# Patient Record
Sex: Female | Born: 1955 | Race: White | Hispanic: No | Marital: Married | Smoking: Current every day smoker
Health system: Southern US, Community
[De-identification: ages and names within clinical notes are randomized; demographics above are authoritative.]

## PROBLEM LIST (undated history)

## (undated) DIAGNOSIS — M1990 Unspecified osteoarthritis, unspecified site: Secondary | ICD-10-CM

## (undated) DIAGNOSIS — F1910 Other psychoactive substance abuse, uncomplicated: Secondary | ICD-10-CM

## (undated) DIAGNOSIS — M797 Fibromyalgia: Secondary | ICD-10-CM

## (undated) DIAGNOSIS — E785 Hyperlipidemia, unspecified: Secondary | ICD-10-CM

## (undated) DIAGNOSIS — I10 Essential (primary) hypertension: Secondary | ICD-10-CM

## (undated) DIAGNOSIS — F329 Major depressive disorder, single episode, unspecified: Secondary | ICD-10-CM

## (undated) DIAGNOSIS — F419 Anxiety disorder, unspecified: Secondary | ICD-10-CM

## (undated) DIAGNOSIS — IMO0002 Reserved for concepts with insufficient information to code with codable children: Secondary | ICD-10-CM

## (undated) DIAGNOSIS — E119 Type 2 diabetes mellitus without complications: Secondary | ICD-10-CM

## (undated) DIAGNOSIS — F32A Depression, unspecified: Secondary | ICD-10-CM

## (undated) HISTORY — DX: Fibromyalgia: M79.7

## (undated) HISTORY — DX: Other psychoactive substance abuse, uncomplicated: F19.10

## (undated) HISTORY — DX: Unspecified osteoarthritis, unspecified site: M19.90

## (undated) HISTORY — DX: Anxiety disorder, unspecified: F41.9

## (undated) HISTORY — DX: Hyperlipidemia, unspecified: E78.5

## (undated) HISTORY — DX: Depression, unspecified: F32.A

## (undated) HISTORY — DX: Essential (primary) hypertension: I10

## (undated) HISTORY — DX: Reserved for concepts with insufficient information to code with codable children: IMO0002

## (undated) HISTORY — DX: Major depressive disorder, single episode, unspecified: F32.9

---

## 1999-08-25 HISTORY — PX: CERVICAL DISC SURGERY: SHX588

## 2000-02-26 ENCOUNTER — Ambulatory Visit (HOSPITAL_COMMUNITY): Admission: RE | Admit: 2000-02-26 | Discharge: 2000-02-26 | Payer: Self-pay | Admitting: Family Medicine

## 2000-02-26 ENCOUNTER — Encounter: Payer: Self-pay | Admitting: Family Medicine

## 2000-03-02 ENCOUNTER — Ambulatory Visit (HOSPITAL_COMMUNITY): Admission: RE | Admit: 2000-03-02 | Discharge: 2000-03-02 | Payer: Self-pay | Admitting: Family Medicine

## 2000-03-02 ENCOUNTER — Emergency Department (HOSPITAL_COMMUNITY): Admission: EM | Admit: 2000-03-02 | Discharge: 2000-03-03 | Payer: Self-pay | Admitting: Emergency Medicine

## 2000-03-02 ENCOUNTER — Encounter: Payer: Self-pay | Admitting: Family Medicine

## 2000-03-26 ENCOUNTER — Encounter: Payer: Self-pay | Admitting: Neurosurgery

## 2000-03-30 ENCOUNTER — Encounter: Payer: Self-pay | Admitting: Neurosurgery

## 2000-03-30 ENCOUNTER — Ambulatory Visit (HOSPITAL_COMMUNITY): Admission: RE | Admit: 2000-03-30 | Discharge: 2000-03-30 | Payer: Self-pay | Admitting: Neurosurgery

## 2000-05-14 ENCOUNTER — Encounter: Payer: Self-pay | Admitting: Neurosurgery

## 2000-05-14 ENCOUNTER — Ambulatory Visit (HOSPITAL_COMMUNITY): Admission: RE | Admit: 2000-05-14 | Discharge: 2000-05-14 | Payer: Self-pay | Admitting: Neurosurgery

## 2000-08-13 ENCOUNTER — Encounter: Payer: Self-pay | Admitting: Neurosurgery

## 2000-08-13 ENCOUNTER — Ambulatory Visit (HOSPITAL_COMMUNITY): Admission: RE | Admit: 2000-08-13 | Discharge: 2000-08-13 | Payer: Self-pay | Admitting: Neurosurgery

## 2001-03-18 ENCOUNTER — Encounter: Payer: Self-pay | Admitting: Family Medicine

## 2001-03-18 ENCOUNTER — Encounter: Admission: RE | Admit: 2001-03-18 | Discharge: 2001-03-18 | Payer: Self-pay | Admitting: Family Medicine

## 2001-12-03 ENCOUNTER — Encounter: Payer: Self-pay | Admitting: Emergency Medicine

## 2001-12-03 ENCOUNTER — Emergency Department (HOSPITAL_COMMUNITY): Admission: EM | Admit: 2001-12-03 | Discharge: 2001-12-04 | Payer: Self-pay | Admitting: *Deleted

## 2001-12-04 ENCOUNTER — Encounter: Payer: Self-pay | Admitting: Emergency Medicine

## 2002-04-13 ENCOUNTER — Emergency Department (HOSPITAL_COMMUNITY): Admission: EM | Admit: 2002-04-13 | Discharge: 2002-04-14 | Payer: Self-pay | Admitting: Emergency Medicine

## 2002-05-01 ENCOUNTER — Ambulatory Visit (HOSPITAL_COMMUNITY): Admission: RE | Admit: 2002-05-01 | Discharge: 2002-05-01 | Payer: Self-pay | Admitting: Family Medicine

## 2002-05-01 ENCOUNTER — Encounter: Payer: Self-pay | Admitting: Family Medicine

## 2002-10-19 ENCOUNTER — Encounter: Payer: Self-pay | Admitting: Sports Medicine

## 2002-10-19 ENCOUNTER — Ambulatory Visit (HOSPITAL_COMMUNITY): Admission: RE | Admit: 2002-10-19 | Discharge: 2002-10-19 | Payer: Self-pay | Admitting: Sports Medicine

## 2005-04-28 ENCOUNTER — Other Ambulatory Visit: Admission: RE | Admit: 2005-04-28 | Discharge: 2005-04-28 | Payer: Self-pay | Admitting: Family Medicine

## 2005-08-10 ENCOUNTER — Encounter: Admission: RE | Admit: 2005-08-10 | Discharge: 2005-08-10 | Payer: Self-pay | Admitting: Family Medicine

## 2005-09-18 ENCOUNTER — Encounter: Admission: RE | Admit: 2005-09-18 | Discharge: 2005-09-18 | Payer: Self-pay | Admitting: Family Medicine

## 2006-08-26 ENCOUNTER — Encounter: Admission: RE | Admit: 2006-08-26 | Discharge: 2006-08-26 | Payer: Self-pay | Admitting: Family Medicine

## 2007-11-30 ENCOUNTER — Encounter: Admission: RE | Admit: 2007-11-30 | Discharge: 2007-11-30 | Payer: Self-pay | Admitting: Internal Medicine

## 2007-12-17 ENCOUNTER — Emergency Department (HOSPITAL_COMMUNITY): Admission: EM | Admit: 2007-12-17 | Discharge: 2007-12-17 | Payer: Self-pay | Admitting: Emergency Medicine

## 2008-02-23 ENCOUNTER — Emergency Department (HOSPITAL_COMMUNITY): Admission: EM | Admit: 2008-02-23 | Discharge: 2008-02-23 | Payer: Self-pay | Admitting: Emergency Medicine

## 2008-04-15 ENCOUNTER — Emergency Department (HOSPITAL_COMMUNITY): Admission: EM | Admit: 2008-04-15 | Discharge: 2008-04-15 | Payer: Self-pay | Admitting: Emergency Medicine

## 2008-04-15 ENCOUNTER — Ambulatory Visit: Payer: Self-pay | Admitting: Psychiatry

## 2008-04-15 ENCOUNTER — Inpatient Hospital Stay (HOSPITAL_COMMUNITY): Admission: EM | Admit: 2008-04-15 | Discharge: 2008-04-19 | Payer: Self-pay | Admitting: Psychiatry

## 2008-12-15 ENCOUNTER — Emergency Department (HOSPITAL_COMMUNITY): Admission: EM | Admit: 2008-12-15 | Discharge: 2008-12-17 | Payer: Self-pay | Admitting: Emergency Medicine

## 2008-12-17 ENCOUNTER — Ambulatory Visit: Payer: Self-pay | Admitting: Psychiatry

## 2008-12-17 ENCOUNTER — Inpatient Hospital Stay (HOSPITAL_COMMUNITY): Admission: RE | Admit: 2008-12-17 | Discharge: 2008-12-19 | Payer: Self-pay | Admitting: Psychiatry

## 2009-10-23 ENCOUNTER — Emergency Department (HOSPITAL_COMMUNITY): Admission: EM | Admit: 2009-10-23 | Discharge: 2009-10-23 | Payer: Self-pay | Admitting: Emergency Medicine

## 2010-04-04 ENCOUNTER — Emergency Department (HOSPITAL_COMMUNITY): Admission: EM | Admit: 2010-04-04 | Discharge: 2010-04-04 | Payer: Self-pay | Admitting: Emergency Medicine

## 2010-04-05 ENCOUNTER — Ambulatory Visit: Payer: Self-pay | Admitting: Psychiatry

## 2010-04-05 ENCOUNTER — Inpatient Hospital Stay (HOSPITAL_COMMUNITY): Admission: RE | Admit: 2010-04-05 | Discharge: 2010-04-10 | Payer: Self-pay | Admitting: Psychiatry

## 2010-11-07 LAB — COMPREHENSIVE METABOLIC PANEL
ALT: 46 U/L — ABNORMAL HIGH (ref 0–35)
AST: 59 U/L — ABNORMAL HIGH (ref 0–37)
Albumin: 4.6 g/dL (ref 3.5–5.2)
Alkaline Phosphatase: 67 U/L (ref 39–117)
CO2: 25 mEq/L (ref 19–32)
Calcium: 9.4 mg/dL (ref 8.4–10.5)
GFR calc non Af Amer: >60 mL/min (ref >60)
Potassium: 3.6 mEq/L (ref 3.5–5.1)

## 2010-11-07 LAB — URINALYSIS, ROUTINE W REFLEX MICROSCOPIC
Ketones, ur: NEGATIVE mg/dL
Leukocytes, UA: NEGATIVE
Specific Gravity, Urine: 1.014 (ref 1.005–1.030)
Urobilinogen, UA: 0.2 mg/dL (ref 0.0–1.0)
pH: 7 (ref 5.0–8.0)
pH: 6.5 (ref 5.0–8.0)

## 2010-11-07 LAB — DIFFERENTIAL
Basophils Absolute: 0 10*3/uL (ref 0.0–0.1)
Basophils Relative: 0 % (ref 0–1)
Lymphocytes Relative: 23 % (ref 12–46)
Lymphs Abs: 1.8 10*3/uL (ref 0.7–4.0)
Monocytes Absolute: 0.7 10*3/uL (ref 0.1–1.0)
Monocytes Relative: 9 % (ref 3–12)

## 2010-11-07 LAB — CBC
HCT: 40.7 % (ref 36.0–46.0)
Hemoglobin: 14.2 g/dL (ref 12.0–15.0)
MCHC: 34.9 g/dL (ref 30.0–36.0)
RBC: 4.09 MIL/uL (ref 3.87–5.11)
WBC: 7.6 10*3/uL (ref 4.0–10.5)

## 2010-11-07 LAB — URINE MICROSCOPIC-ADD ON

## 2010-11-07 LAB — RAPID URINE DRUG SCREEN, HOSP PERFORMED
Cocaine: NOT DETECTED
Tetrahydrocannabinol: POSITIVE — AB

## 2010-12-03 LAB — COMPREHENSIVE METABOLIC PANEL
ALT: 94 U/L — ABNORMAL HIGH (ref 0–35)
ALT: 92 U/L — ABNORMAL HIGH (ref 0–35)
AST: 104 U/L — ABNORMAL HIGH (ref 0–37)
AST: 91 U/L — ABNORMAL HIGH (ref 0–37)
Albumin: 4.2 g/dL (ref 3.5–5.2)
Albumin: 4.1 g/dL (ref 3.5–5.2)
CO2: 28 mEq/L (ref 19–32)
Calcium: 10.3 mg/dL (ref 8.4–10.5)
Creatinine, Ser: 0.96 mg/dL (ref 0.4–1.2)
GFR calc Af Amer: >60 mL/min (ref >60)
GFR calc Af Amer: >60 mL/min (ref >60)
Glucose, Bld: 114 mg/dL — ABNORMAL HIGH (ref 70–99)
Potassium: 3.4 mEq/L — ABNORMAL LOW (ref 3.5–5.1)
Sodium: 136 mEq/L (ref 135–145)
Total Protein: 7.2 g/dL (ref 6.0–8.3)
Total Protein: 7.2 g/dL (ref 6.0–8.3)

## 2010-12-03 LAB — URINALYSIS, ROUTINE W REFLEX MICROSCOPIC
Bilirubin Urine: NEGATIVE
Glucose, UA: NEGATIVE mg/dL
Nitrite: NEGATIVE
Specific Gravity, Urine: 1.019 (ref 1.005–1.030)
Urobilinogen, UA: 1 mg/dL (ref 0.0–1.0)

## 2010-12-03 LAB — DIFFERENTIAL
Basophils Absolute: 0 10*3/uL (ref 0.0–0.1)
Basophils Relative: 1 % (ref 0–1)
Eosinophils Absolute: 0.4 10*3/uL (ref 0.0–0.7)
Neutrophils Relative %: 62 % (ref 43–77)

## 2010-12-03 LAB — URINE MICROSCOPIC-ADD ON

## 2010-12-03 LAB — TSH: TSH: 1.325 u[IU]/mL (ref 0.350–4.500)

## 2010-12-03 LAB — CBC
MCV: 105.6 fL — ABNORMAL HIGH (ref 78.0–100.0)
RBC: 3.37 MIL/uL — ABNORMAL LOW (ref 3.87–5.11)

## 2010-12-03 LAB — RAPID URINE DRUG SCREEN, HOSP PERFORMED
Amphetamines: NOT DETECTED
Barbiturates: NOT DETECTED
Cocaine: NOT DETECTED
Opiates: POSITIVE — AB
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
## 2011-01-09 NOTE — Op Note (Signed)
[place]  Patient:    Vanessa Hoffman, Vanessa Hoffman                   MRN: [id number] Proc. Date: 03/30/00 Adm. Date:  [id number] Disc. Date: [id number] Attending:  Donalee Citrin P                           Operative Report  PREOPERATIVE DIAGNOSIS:  C6 and C7 radiculopathy left.  POSTOPERATIVE DIAGNOSIS:  C6 and C7 radiculopathy left.  PROCEDURE PERFORMED:  Anterior cervical diskectomy infusion with fibula allograft and plating system C5-6 and C6-7.  SURGEON:  Donzetta Sprung. Roney Jaffe., MD  ASSISTANT:  Reinaldo Meeker, M.D.  ANESTHESIA:  General endotracheal.  BRIEF HISTORY:  The patient is a 55 year old who has had long-standing neck and left arm pain radiating into her thumb and little fingers all equally. She has had weakness in her triceps and biceps.  Evaluation revealed cervical spondylosis with a large disk discrepancy C5-6 left and C6-7 diffusely causing some cervical stenosis.  The patient presents for diskectomy.  DESCRIPTION OF PROCEDURE:  The patient was brought into the operating room and was induced under general anesthesia.  She was positioned supine on the bed with the right side of her neck prepped and draped in routine sterile fashion. A preoperative x-ray confirmed the C6-7 interspace and a curvilinear incision was made extending to the anterior border of the sternocleidomastoid into the midline.  An incision was made with a 20 blade scalpel.  Bovie electrocautery was used to get through the subcutaneous tissue.  The Metzenbaums were used to dissect out the superficial layer of the platysma.  The platysma was divided transversely.  The deep layer of the platysma was also dissected out with Metzenbaum scissors.  The avascular plane between the sternocleidomastoid and the omohyoid was developed down to the cervical spine.  The prevertebral fascia was divided using Kitners and the longus coli was identified and coagulated back using Bovie  electrocautery.  A spinal needle was inserted into the interspace.  Preoperative x-ray confirmed that to be the C5-6 interspace and then Bovie electrocautery used to take down the longus coli bilaterally. Then the Charnley retractor was placed as well as the up and down retractors. The 5-6 interspace was incised with an 11 blade scalpel as well as the C6-7 interspace, BA and FA curets were used to clear out the anterior annulus as well as pituitary rongeurs.  A 2-3 mm Kerrison punch were used to overbite the anterior osteophytes and then the Midas Rex was brought in with an A&A drill bit and used to drill down the interspace of C5 initially.  Then a 1-2 mm Kerrison punch was used to get underneath the osteophyte and a large disk fragment was noted to be subligamentous displacing the left C6 nerve root away.  This was teased out using 1 or 2 mm Kerrison punches and the dura was noted to be decompressed and tenting back up towards [place].  The remainder of the ligament was removed and the endplates were underbiten until both C6 nerve roots of C5-6 were decompressed.  The endplates were even off and a nerve hook was used to palpate both nerve roots and noted to be completely decompressed. Gelfoam was placed and attention was taken to the C6-7 interspace.  The Midas Rex was used to drill down the posterior osteophyte on the top of C7 and then 1 or 2 mm  Kerrison punches were used to get underneath the posterior longitudinal ligament.  This was divided.  The dura was visualized and then the ligament was then taken out and removed and reflected laterally down exposing the C7 nerve roots bilaterally.  The endplates were underbiten and the large posterior osteophyte at C7 was removed.  At the end of the decompression, both C7 nerve roots as well as thecal sac were noted to be completely decompressed.  The endplates were even off using the Midas Rex, drill bit and the BA curet.  Then attention was  turned to bone plug placement.  A 6 mm fibula allograft was placed at C5-6 after meticulous hemostasis had been maintained and noted to be under good compression.  A 7 mm fibula allograft was placed at C6-7.  Again all noted to be under good compression.  A 40 mm Atlantis plate was selected and this was aligned and then the right superior drill hole was drilled.  A 13 mm screw was placed.  The left inferior drill hole was drilled and 13 mm screws were placed there.  This was repeated at the superior left on 5, inferior right on 7 as well as two screws were placed in C6.  At the end, the wound was copiously irrigated.  Meticulously hemostasis was maintained and closed with 3-0 interrupted Vicryl on the platysma and a running 5-0 PDS on the skin.  Steri-Strips were applied.  The wound was dressed.  A collar was applied.  The patient went to the recovery room in stable condition.  At the end of the case all needle counts and sponge counts were correct. DD:  03/30/00 TD:  03/31/00 Job: [id number]
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
## 2011-03-31 ENCOUNTER — Other Ambulatory Visit: Payer: Self-pay | Admitting: Family Medicine

## 2011-03-31 DIAGNOSIS — Z1231 Encounter for screening mammogram for malignant neoplasm of breast: Secondary | ICD-10-CM

## 2011-04-15 ENCOUNTER — Ambulatory Visit: Payer: Self-pay

## 2011-04-23 ENCOUNTER — Telehealth: Payer: Self-pay | Admitting: *Deleted

## 2011-04-23 NOTE — Telephone Encounter (Signed)
Unable to reach by phone/sent no show letter.  me

## 2011-05-06 ENCOUNTER — Encounter: Payer: Self-pay | Admitting: Gastroenterology

## 2011-05-07 ENCOUNTER — Other Ambulatory Visit: Payer: Self-pay | Admitting: Gastroenterology

## 2011-05-14 ENCOUNTER — Ambulatory Visit
Admission: RE | Admit: 2011-05-14 | Discharge: 2011-05-14 | Disposition: A | Discharge status: Home / Self Care | Payer: Medicare Other | Source: Ambulatory Visit | Attending: Family Medicine | Admitting: Family Medicine

## 2011-05-14 DIAGNOSIS — Z1231 Encounter for screening mammogram for malignant neoplasm of breast: Secondary | ICD-10-CM

## 2011-05-15 ENCOUNTER — Encounter: Payer: Self-pay | Admitting: *Deleted

## 2011-05-21 LAB — LIPASE, BLOOD: Lipase: 79 — ABNORMAL HIGH

## 2011-05-21 LAB — COMPREHENSIVE METABOLIC PANEL
ALT: 71 — ABNORMAL HIGH
Alkaline Phosphatase: 80
Glucose, Bld: 94
Potassium: 4
Sodium: 135
Total Protein: 6.3

## 2011-05-22 ENCOUNTER — Ambulatory Visit: Payer: Self-pay | Admitting: Physical Medicine & Rehabilitation

## 2011-05-27 ENCOUNTER — Other Ambulatory Visit: Payer: Self-pay | Admitting: Gastroenterology

## 2011-06-18 ENCOUNTER — Telehealth: Payer: Self-pay | Admitting: *Deleted

## 2011-06-18 NOTE — Telephone Encounter (Signed)
Rescheduled for previsit

## 2011-06-19 ENCOUNTER — Ambulatory Visit (AMBULATORY_SURGERY_CENTER): Payer: Medicare Other | Admitting: *Deleted

## 2011-06-19 VITALS — Ht 63.0 in | Wt 155.0 lb

## 2011-06-19 DIAGNOSIS — Z1211 Encounter for screening for malignant neoplasm of colon: Secondary | ICD-10-CM

## 2011-06-19 MED ORDER — PEG-KCL-NACL-NASULF-NA ASC-C 100 G PO SOLR: ORAL | Status: DC

## 2011-06-29 ENCOUNTER — Encounter
Payer: PRIVATE HEALTH INSURANCE | Attending: Physical Medicine & Rehabilitation | Admitting: Physical Medicine & Rehabilitation

## 2011-06-29 ENCOUNTER — Ambulatory Visit: Payer: Self-pay | Admitting: Physical Medicine & Rehabilitation

## 2011-06-29 DIAGNOSIS — F1910 Other psychoactive substance abuse, uncomplicated: Secondary | ICD-10-CM | POA: Insufficient documentation

## 2011-06-29 DIAGNOSIS — F411 Generalized anxiety disorder: Secondary | ICD-10-CM | POA: Insufficient documentation

## 2011-06-29 DIAGNOSIS — IMO0001 Reserved for inherently not codable concepts without codable children: Secondary | ICD-10-CM

## 2011-06-29 DIAGNOSIS — M7710 Lateral epicondylitis, unspecified elbow: Secondary | ICD-10-CM | POA: Insufficient documentation

## 2011-06-29 DIAGNOSIS — M549 Dorsalgia, unspecified: Secondary | ICD-10-CM | POA: Insufficient documentation

## 2011-06-29 DIAGNOSIS — M961 Postlaminectomy syndrome, not elsewhere classified: Secondary | ICD-10-CM

## 2011-06-29 DIAGNOSIS — F329 Major depressive disorder, single episode, unspecified: Secondary | ICD-10-CM | POA: Insufficient documentation

## 2011-06-29 DIAGNOSIS — F3289 Other specified depressive episodes: Secondary | ICD-10-CM | POA: Insufficient documentation

## 2011-06-29 DIAGNOSIS — M25529 Pain in unspecified elbow: Secondary | ICD-10-CM | POA: Insufficient documentation

## 2011-06-29 DIAGNOSIS — F17200 Nicotine dependence, unspecified, uncomplicated: Secondary | ICD-10-CM | POA: Insufficient documentation

## 2011-06-29 DIAGNOSIS — Z79899 Other long term (current) drug therapy: Secondary | ICD-10-CM | POA: Insufficient documentation

## 2011-06-29 DIAGNOSIS — M546 Pain in thoracic spine: Secondary | ICD-10-CM | POA: Insufficient documentation

## 2011-06-29 DIAGNOSIS — G894 Chronic pain syndrome: Secondary | ICD-10-CM | POA: Insufficient documentation

## 2011-06-29 DIAGNOSIS — Z981 Arthrodesis status: Secondary | ICD-10-CM | POA: Insufficient documentation

## 2011-06-29 DIAGNOSIS — G5600 Carpal tunnel syndrome, unspecified upper limb: Secondary | ICD-10-CM
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
## 2011-07-02 ENCOUNTER — Other Ambulatory Visit: Payer: Self-pay | Admitting: Gastroenterology

## 2011-07-07 ENCOUNTER — Ambulatory Visit (AMBULATORY_SURGERY_CENTER): Payer: Medicare Other | Admitting: Gastroenterology

## 2011-07-07 ENCOUNTER — Encounter: Payer: Self-pay | Admitting: Gastroenterology

## 2011-07-07 VITALS — BP 118/77 | HR 92 | Temp 98.7°F | Resp 11 | Ht 63.0 in | Wt 155.0 lb

## 2011-07-07 DIAGNOSIS — Z1211 Encounter for screening for malignant neoplasm of colon: Secondary | ICD-10-CM

## 2011-07-07 MED ORDER — SODIUM CHLORIDE 0.9 % IV SOLN: 500.0000 mL | INTRAVENOUS | Status: DC

## 2011-07-07 NOTE — Progress Notes (Signed)
Patient did not experience any of the following events: a burn prior to discharge; a fall within the facility; wrong site/side/patient/procedure/implant event; or a hospital transfer or hospital admission upon discharge from the facility. (G8907) Patient did not have preoperative order for IV antibiotic SSI prophylaxis. (G8918)  

## 2011-07-07 NOTE — Patient Instructions (Signed)
Please follow all discharge instructions given to you by the recovery room nurse. If you have any questions or problems after discharge please call [phone]. You will receive a phone call in the am to see how you are doing and answer any questions you may have. Thank you for choosing [facility] for your health care needs.  

## 2011-07-07 NOTE — Progress Notes (Signed)
Propofol was administered to the pt by Brennan Bailey, CRNA for sedation. Maw  14:28 second bag of 0.9%N.Haywood Pao. Maw  Pt tolerated the colonoscopy very well. maw

## 2011-07-08 ENCOUNTER — Telehealth: Payer: Self-pay | Admitting: *Deleted

## 2011-07-08 NOTE — Telephone Encounter (Signed)

## 2011-07-27 ENCOUNTER — Emergency Department (HOSPITAL_COMMUNITY)
Admission: EM | Admit: 2011-07-27 | Discharge: 2011-07-28 | Disposition: A | Discharge status: Other Acute Inpatient Hospital | Payer: PRIVATE HEALTH INSURANCE | Source: Home / Self Care | Attending: Emergency Medicine | Admitting: Emergency Medicine

## 2011-07-27 ENCOUNTER — Encounter (HOSPITAL_COMMUNITY): Payer: Self-pay

## 2011-07-27 DIAGNOSIS — F329 Major depressive disorder, single episode, unspecified: Secondary | ICD-10-CM

## 2011-07-27 DIAGNOSIS — I10 Essential (primary) hypertension: Secondary | ICD-10-CM | POA: Insufficient documentation

## 2011-07-27 DIAGNOSIS — F17200 Nicotine dependence, unspecified, uncomplicated: Secondary | ICD-10-CM | POA: Insufficient documentation

## 2011-07-27 DIAGNOSIS — IMO0001 Reserved for inherently not codable concepts without codable children: Secondary | ICD-10-CM | POA: Insufficient documentation

## 2011-07-27 DIAGNOSIS — F3289 Other specified depressive episodes: Secondary | ICD-10-CM | POA: Insufficient documentation

## 2011-07-27 LAB — CBC
HCT: 30.7 % — ABNORMAL LOW (ref 36.0–46.0)
MCHC: 33.9 g/dL (ref 30.0–36.0)
MCV: 94.2 fL (ref 78.0–100.0)
RDW: 13.4 % (ref 11.5–15.5)

## 2011-07-27 LAB — BASIC METABOLIC PANEL
BUN: 22 mg/dL (ref 6–23)
Calcium: 10.2 mg/dL (ref 8.4–10.5)
Creatinine, Ser: 1.41 mg/dL — ABNORMAL HIGH (ref 0.50–1.10)
GFR calc Af Amer: 48 mL/min — ABNORMAL LOW (ref >90)
GFR calc non Af Amer: 42 mL/min — ABNORMAL LOW (ref >90)
Potassium: 4.2 mEq/L (ref 3.5–5.1)

## 2011-07-27 LAB — RAPID URINE DRUG SCREEN, HOSP PERFORMED: Barbiturates: NOT DETECTED

## 2011-07-27 MED ORDER — ZOLPIDEM TARTRATE 5 MG PO TABS
5.0000 mg | ORAL_TABLET | Freq: Every evening | ORAL | Status: DC | PRN
  Administered 2011-07-27: 5 mg via ORAL
  Filled 2011-07-27: qty 1

## 2011-07-27 MED ORDER — ACETAMINOPHEN 325 MG PO TABS
650.0000 mg | ORAL_TABLET | ORAL | Status: DC | PRN
Start: 2011-07-27 — End: 2011-07-28
  Administered 2011-07-27: 650 mg via ORAL
  Filled 2011-07-27: qty 2

## 2011-07-27 MED ORDER — ALUM & MAG HYDROXIDE-SIMETH 200-200-20 MG/5ML PO SUSP: 30.0000 mL | ORAL | Status: DC | PRN

## 2011-07-27 MED ORDER — LORAZEPAM 1 MG PO TABS
1.0000 mg | ORAL_TABLET | Freq: Three times a day (TID) | ORAL | Status: DC | PRN
  Administered 2011-07-27 – 2011-07-28 (×2): 1 mg via ORAL
  Filled 2011-07-27 (×2): qty 1

## 2011-07-27 MED ORDER — NICOTINE 21 MG/24HR TD PT24
21.0000 mg | MEDICATED_PATCH | Freq: Every day | TRANSDERMAL | Status: DC
  Administered 2011-07-27 – 2011-07-28 (×2): 21 mg via TRANSDERMAL
  Filled 2011-07-27 (×2): qty 1

## 2011-07-27 MED ORDER — IBUPROFEN 200 MG PO TABS: 600.0000 mg | ORAL_TABLET | Freq: Three times a day (TID) | ORAL | Status: DC | PRN

## 2011-07-27 MED ORDER — ONDANSETRON HCL 4 MG PO TABS: 4.0000 mg | ORAL_TABLET | Freq: Three times a day (TID) | ORAL | Status: DC | PRN

## 2011-07-27 MED ORDER — SODIUM CHLORIDE 0.9 % IV BOLUS (SEPSIS)
1000.0000 mL | Freq: Once | INTRAVENOUS | Status: AC
  Administered 2011-07-27: 1000 mL via INTRAVENOUS

## 2011-07-27 NOTE — ED Notes (Signed)
One patient belonging bag locked in activity room. 

## 2011-07-27 NOTE — ED Notes (Signed)
Patient here with flight of ideas. Reports arthritis symptoms then switches to discussion of depression then back to right elbow pain, patient with flat affect. Reports taking wellbutrin x 3 days

## 2011-07-27 NOTE — ED Notes (Signed)
Pt speaking with Social Worker at this time 

## 2011-07-27 NOTE — ED Notes (Signed)
Pt to be transferred to main ED room 20 for fluid bolus and then to return after bolus finishes. Report called to [place], Charity fundraiser.

## 2011-07-27 NOTE — ED Notes (Signed)
Pt sent to the restroom to change into scrubs. Triage RN aware

## 2011-07-27 NOTE — ED Notes (Signed)
Went to take patient vital signs. Patient was not in her room. Found her in another empty room sitting on bed watching tv. Asked patient why she was in this room and she stated "Because this is my room." Showed patient back to her correct room.

## 2011-07-27 NOTE — BH Assessment (Addendum)
Assessment Note   Vanessa Hoffman is an 55 y.o. female. Pt was accompanied to the [facility] by her father. Pt states that she is at the [facility] because her "ankles have been doing crazy things and making her walk funny". Pt explained that she "doesn't feel right", stating that she feels she has been talking slower along with trouble getting her thoughts together and staying focused. Pt presents with a flight of ideas throughout assessment. Pt reported to the RN that she had been having auditory hallucinations for 2 months, though told this writer that there have been no auditory hallucinations, only visual hallucinations for 3 days. Pt reports seeing things "flash before her" and "sensing that someone is beside her". Pt also added that she has been seeing dogs, though there are none in her home. Pt had a negative UDS and BAL was <11. Pt has no current therapist or psychiatrist. Pt information is being sent to [facility] for review.   07/27/11  22:14: Pt is now stating that she has not had any alcohol in 3 weeks, though earlier in assessment she stated that it had only been 1 week without alcohol. Pt also states that she has had hallucinations and trouble with her "ankles" for 3 weeks, however the issues with slow speech began more than 3 weeks ago. 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 EDP has requested a UA and plans to give the pt fluids. Pt is not on a CIWA protocol.    Axis I: Psychotic Disorder NOS Axis II: Deferred Axis III:  Past Medical History  Diagnosis Date  . Anxiety   . Arthritis   . Depression   . Hypertension   . Fibromyalgia   . DDD (degenerative disc disease)   . Osteoporosis    Axis IV: problems with primary support group Axis V: 21-30 behavior considerably influenced by delusions or hallucinations OR serious impairment in judgment, communication OR inability to function in almost all areas  Past Medical History:  Past Medical History    Diagnosis Date  . Anxiety   . Arthritis   . Depression   . Hypertension   . Fibromyalgia   . DDD (degenerative disc disease)   . Osteoporosis     Past Surgical History  Procedure Date  . Cervical disc surgery 2001    3 fusions  . Cesarean section     2    Family History: No family history on file.  Social History:  reports that she has been smoking Cigarettes.  She has been smoking about .3 packs per day. She has never used smokeless tobacco. She reports that she drinks about 4.2 ounces of alcohol per week. She reports that she does not use illicit drugs.  Allergies: No Known Allergies  Home Medications:  Medications Prior to Admission  Medication Dose Route Frequency Provider Last Rate Last Dose  . acetaminophen (TYLENOL) tablet 650 mg  650 mg Oral Q4H PRN Thomasene Lot, PA   650 mg at 07/27/11 1701  . alum & mag hydroxide-simeth (MAALOX/MYLANTA) 200-200-20 MG/5ML suspension 30 mL  30 mL Oral PRN Thomasene Lot, PA      . ibuprofen (ADVIL,MOTRIN) tablet 600 mg  600 mg Oral Q8H PRN Thomasene Lot, PA      . LORazepam (ATIVAN) tablet 1 mg  1 mg Oral Q8H PRN Thomasene Lot, PA      . nicotine (NICODERM CQ - dosed in mg/24 hours) patch 21 mg  21 mg Transdermal Daily Thomasene Lot, [place]   21 mg at 07/27/11 1703  . ondansetron (ZOFRAN) tablet 4 mg  4 mg Oral Q8H PRN Thomasene Lot, PA      . zolpidem (AMBIEN) tablet 5 mg  5 mg Oral QHS PRN Thomasene Lot, PA       Medications Prior to Admission  Medication Sig Dispense Refill  . amitriptyline (ELAVIL) 25 MG tablet Take 25 mg by mouth 2 (two) times daily.        . clonazePAM (KLONOPIN) 1 MG tablet Take 1 mg by mouth 3 (three) times daily.        . pravastatin (PRAVACHOL) 20 MG tablet Take 20 mg by mouth at bedtime.        . traZODone (DESYREL) 100 MG tablet Take 100 mg by mouth at bedtime.        . valsartan-hydrochlorothiazide (DIOVAN-HCT) 160-12.5 MG per tablet Take 1 tablet by mouth daily.          OB/GYN Status:  No  LMP recorded. Patient is postmenopausal.  General Assessment Data Assessment Number: 1  Living Arrangements: Parent Can pt return to current living arrangement?: Yes Admission Status: Voluntary Is patient capable of signing voluntary admission?: Yes Transfer from: Acute Hospital Referral Source: Self/Family/Friend  Risk to self Suicidal Ideation: No Suicidal Intent: No Is patient at risk for suicide?: No Suicidal Plan?: No Access to Means: No What has been your use of drugs/alcohol within the last 12 months?: etoh: 6 beers daily for 15yrs Other Self Harm Risks: Pt reported previous gesture 1 x  Triggers for Past Attempts: Other (Comment) (depression: trouble with marriage: husband unfaithful) Intentional Self Injurious Behavior: None Factors that decrease suicide risk: Sense of responsibility to family Family Suicide History: Yes Recent stressful life event(s): Other (Comment) (moving in with her father in 2000) Persecutory voices/beliefs?: No Depression: Yes Depression Symptoms: Insomnia;Isolating;Fatigue;Loss of interest in usual pleasures;Feeling worthless/self pity Substance abuse history and/or treatment for substance abuse?: Yes (multiple inpt. stays for detox) Suicide prevention information given to non-admitted patients: Not applicable  Risk to Others Homicidal Ideation: No Thoughts of Harm to Others: No Current Homicidal Intent: No Current Homicidal Plan: No Access to Homicidal Means: No Identified Victim: none History of harm to others?: No Assessment of Violence: None Noted Violent Behavior Description: n/a Does patient have access to weapons?: No Criminal Charges Pending?: No Does patient have a court date: No  Mental Status Report Appear/Hygiene: Disheveled Eye Contact: Fair Motor Activity: Unable to assess Speech: Other (Comment);Slow (pt displaying flight of ideas) Level of Consciousness: Alert Mood: Ambivalent Affect: Blunted (flat) Anxiety Level:  None Thought Processes: Irrelevant;Flight of Ideas Judgement: Impaired Orientation: Person;Place Obsessive Compulsive Thoughts/Behaviors: None  Cognitive Functioning Concentration: Decreased Memory: Recent Impaired;Remote Impaired IQ: Average Insight: Fair Impulse Control: Fair Appetite: Good Sleep: No Change Total Hours of Sleep: 4  Vegetative Symptoms: None  Prior Inpatient/Outpatient Therapy Prior Therapy: Inpatient Prior Therapy Dates: 2001, 2003, 2010, 2012 Prior Therapy Facilty/Provider(s): [facility], [place], [facility], [address] Reason for Treatment: detox, 2001 suicide attempt                     Additional Information 1:1 In Past 12 Months?: No CIRT Risk: No Elopement Risk: No Does patient have medical clearance?: Yes     Disposition:  Disposition Disposition of Patient: Inpatient treatment program;Referred to [facility]) Type of inpatient treatment program: Adult Patient referred to: Other (Comment) [facility])  Patient was admitted to [facility] from NP [place] to  Dr. Allena Katz to a 400 hall bed. [facility] need results of UA prior to patient being transported to [facility].  On Site Evaluation by:   Reviewed with Physician:     N[place] Crane F 07/27/2011 7:05 PM

## 2011-07-27 NOTE — ED Notes (Signed)
Patient requested for her parents to come back to visit. Called triage and they will call for parents.

## 2011-07-27 NOTE — ED Notes (Signed)
Introduced self to patient. Asked if she would like to have something to drink. Provided patient with soda and chips. Patients also needed a remote for television. Provided for patient.

## 2011-07-27 NOTE — ED Notes (Signed)
Pt states she has been having auditory hallucinations x 2 months.  Unable to understand what they are saying.  Also states that "her feet do weird things when they move or when she tries to walk" (Pt ambulates without difficulty).  Denies SI/HI.

## 2011-07-27 NOTE — ED Provider Notes (Signed)
History     CSN: [id number] Arrival date & time: 07/27/2011  3:00 PM   First MD Initiated Contact with Patient 07/27/11 1501    3:38 PM HPI  Reports falling frequently, speech change, and myalgias. Also states she has  depression since a child. And is here for treatment. Denies SI/HI, Hallucinations.   PCP Dr. Riley Kill  Patient is a 55 y.o. female presenting with mental health disorder. The history is provided by the patient.  Mental Health Problem The primary symptoms do not include hallucinations. This is a chronic problem.  The degree of incapacity that she is experiencing as a consequence of her illness is mild. Additional symptoms of the illness include flight of ideas. She does not admit to suicidal ideas. She does not have a plan to commit suicide. She does not contemplate harming herself. She has not already injured self. She does not contemplate injuring another person. She has not already  injured another person.    Past Medical History  Diagnosis Date  . Anxiety   . Arthritis   . Depression   . Hypertension   . Fibromyalgia   . DDD (degenerative disc disease)   . Osteoporosis     Past Surgical History  Procedure Date  . Cervical disc surgery 2001    3 fusions  . Cesarean section     2    No family history on file.  History  Substance Use Topics  . Smoking status: Current Everyday Smoker -- 0.3 packs/day    Types: Cigarettes  . Smokeless tobacco: Never Used  . Alcohol Use: 4.2 oz/week    7 Glasses of wine per week    OB History    Grav Para Term Preterm Abortions TAB SAB Ect Mult Living                  Review of Systems  Musculoskeletal: Positive for myalgias, arthralgias and gait problem.  Psychiatric/Behavioral: Positive for behavioral problems. Negative for suicidal ideas, hallucinations and self-injury.  All other systems reviewed and are negative.    Allergies  Review of patient's allergies indicates no known allergies.  Home Medications     Current Outpatient Rx  Name Route Sig Dispense Refill  . AMITRIPTYLINE HCL 25 MG PO TABS Oral Take 25 mg by mouth 2 (two) times daily.      . BUPROPION HCL ER (XL) 150 MG PO TB24 Oral Take 150 mg by mouth daily.      Marland Kitchen CLONAZEPAM 1 MG PO TABS Oral Take 1 mg by mouth 3 (three) times daily.      Marland Kitchen HYDROCODONE-ACETAMINOPHEN 5-325 MG PO TABS Oral Take 1 tablet by mouth every 12 (twelve) hours as needed. pain     . PRAVASTATIN SODIUM 20 MG PO TABS Oral Take 20 mg by mouth at bedtime.      Marland Kitchen PROPRANOLOL HCL 10 MG PO TABS Oral Take 10 mg by mouth daily.      Marland Kitchen PROPRANOLOL HCL 20 MG PO TABS Oral Take 20 mg by mouth 2 (two) times daily.      . TRAZODONE HCL 100 MG PO TABS Oral Take 100 mg by mouth at bedtime.      Marland Kitchen VALSARTAN-HYDROCHLOROTHIAZIDE 160-12.5 MG PO TABS Oral Take 1 tablet by mouth daily.        BP 100/63  Pulse 105  Temp(Src) 97.7 F (36.5 C) (Oral)  Resp 20  SpO2 100%  Physical Exam  Vitals reviewed. Constitutional: She is oriented to person,  place, and time. Vital signs are normal. She appears well-developed and well-nourished.  HENT:  Head: Normocephalic and atraumatic.  Eyes: Conjunctivae are normal. Pupils are equal, round, and reactive to light.  Neck: Normal range of motion. Neck supple.  Cardiovascular: Normal rate, regular rhythm and normal heart sounds.  Exam reveals no friction rub.   No murmur heard. Pulmonary/Chest: Effort normal and breath sounds normal. She has no wheezes. She has no rhonchi. She has no rales. She exhibits no tenderness.  Musculoskeletal: Normal range of motion.  Neurological: She is alert and oriented to person, place, and time. Coordination normal.  Skin: Skin is warm and dry. No rash noted. No erythema. No pallor.  Psychiatric:       Patient has a flat affect and a poor historian. States she has several vague problems but avoids telling me how long she has had the problems. Then states she is also depressed. In appropriate reactions: Laughs  when I ask about SI or HI, but denies it.     ED Course  Procedures   MDM   10:46 PM Patient denies significant history of alcohol abuse. BUN slightly elevated compared to prior labs. We'll give him a liter of IV fluids and recheck. Has spoken with the patient will come see the patient for evaluation.      Thomasene Lot, [place] 07/28/11 [id number]

## 2011-07-27 NOTE — ED Notes (Signed)
Escorted pt to room 20; assisted into bed. Marissa, RN at bedside at this time.

## 2011-07-28 ENCOUNTER — Encounter (HOSPITAL_COMMUNITY): Payer: Self-pay | Admitting: *Deleted

## 2011-07-28 ENCOUNTER — Inpatient Hospital Stay (HOSPITAL_COMMUNITY)
Admission: RE | Admit: 2011-07-28 | Discharge: 2011-08-21 | DRG: 885 | Disposition: A | Discharge status: Home / Self Care | Payer: PRIVATE HEALTH INSURANCE | Source: Ambulatory Visit | Attending: Psychiatry | Admitting: Psychiatry

## 2011-07-28 DIAGNOSIS — I10 Essential (primary) hypertension: Secondary | ICD-10-CM

## 2011-07-28 DIAGNOSIS — Z79899 Other long term (current) drug therapy: Secondary | ICD-10-CM

## 2011-07-28 DIAGNOSIS — Z981 Arthrodesis status: Secondary | ICD-10-CM

## 2011-07-28 DIAGNOSIS — IMO0001 Reserved for inherently not codable concepts without codable children: Secondary | ICD-10-CM

## 2011-07-28 DIAGNOSIS — Z818 Family history of other mental and behavioral disorders: Secondary | ICD-10-CM

## 2011-07-28 DIAGNOSIS — Z7982 Long term (current) use of aspirin: Secondary | ICD-10-CM

## 2011-07-28 DIAGNOSIS — F411 Generalized anxiety disorder: Secondary | ICD-10-CM

## 2011-07-28 DIAGNOSIS — M810 Age-related osteoporosis without current pathological fracture: Secondary | ICD-10-CM

## 2011-07-28 DIAGNOSIS — I672 Cerebral atherosclerosis: Secondary | ICD-10-CM

## 2011-07-28 DIAGNOSIS — R45851 Suicidal ideations: Secondary | ICD-10-CM

## 2011-07-28 DIAGNOSIS — IMO0002 Reserved for concepts with insufficient information to code with codable children: Secondary | ICD-10-CM

## 2011-07-28 DIAGNOSIS — M129 Arthropathy, unspecified: Secondary | ICD-10-CM

## 2011-07-28 DIAGNOSIS — F333 Major depressive disorder, recurrent, severe with psychotic symptoms: Secondary | ICD-10-CM | POA: Diagnosis present

## 2011-07-28 DIAGNOSIS — F17200 Nicotine dependence, unspecified, uncomplicated: Secondary | ICD-10-CM

## 2011-07-28 DIAGNOSIS — F0150 Vascular dementia without behavioral disturbance: Secondary | ICD-10-CM

## 2011-07-28 DIAGNOSIS — F1910 Other psychoactive substance abuse, uncomplicated: Secondary | ICD-10-CM

## 2011-07-28 LAB — URINALYSIS, ROUTINE W REFLEX MICROSCOPIC
Bilirubin Urine: NEGATIVE
Glucose, UA: NEGATIVE mg/dL
Hgb urine dipstick: NEGATIVE
Ketones, ur: NEGATIVE mg/dL
pH: 6.5 (ref 5.0–8.0)

## 2011-07-28 LAB — BASIC METABOLIC PANEL
BUN: 25 mg/dL — ABNORMAL HIGH (ref 6–23)
CO2: 30 mEq/L (ref 19–32)
Chloride: 101 mEq/L (ref 96–112)
Creatinine, Ser: 1.34 mg/dL — ABNORMAL HIGH (ref 0.50–1.10)

## 2011-07-28 MED ORDER — PNEUMOCOCCAL VAC POLYVALENT 25 MCG/0.5ML IJ INJ: 0.5000 mL | INJECTION | INTRAMUSCULAR | Status: AC

## 2011-07-28 MED ORDER — AMITRIPTYLINE HCL 25 MG PO TABS: 25.0000 mg | ORAL_TABLET | Freq: Two times a day (BID) | ORAL | Status: DC

## 2011-07-28 MED ORDER — CLONAZEPAM 1 MG PO TABS: 1.0000 mg | ORAL_TABLET | Freq: Three times a day (TID) | ORAL | Status: DC

## 2011-07-28 MED ORDER — ARIPIPRAZOLE 2 MG PO TABS
2.0000 mg | ORAL_TABLET | Freq: Every day | ORAL | Status: DC
  Filled 2011-07-28: qty 1

## 2011-07-28 MED ORDER — AMITRIPTYLINE HCL 25 MG PO TABS
25.0000 mg | ORAL_TABLET | ORAL | Status: DC
  Administered 2011-07-28 – 2011-08-03 (×12): 25 mg via ORAL
  Filled 2011-07-28 (×14): qty 1

## 2011-07-28 MED ORDER — PROPRANOLOL HCL 20 MG PO TABS: 20.0000 mg | ORAL_TABLET | Freq: Two times a day (BID) | ORAL | Status: DC

## 2011-07-28 MED ORDER — SIMVASTATIN 10 MG PO TABS: 10.0000 mg | ORAL_TABLET | Freq: Every day | ORAL | Status: DC

## 2011-07-28 MED ORDER — HYDROCHLOROTHIAZIDE 12.5 MG PO CAPS
12.5000 mg | ORAL_CAPSULE | Freq: Every day | ORAL | Status: DC
  Administered 2011-07-28 – 2011-08-21 (×24): 12.5 mg via ORAL
  Filled 2011-07-28: qty 10
  Filled 2011-07-28 (×7): qty 1
  Filled 2011-07-28: qty 10
  Filled 2011-07-28: qty 1
  Filled 2011-07-28: qty 10
  Filled 2011-07-28 (×8): qty 1
  Filled 2011-07-28: qty 10
  Filled 2011-07-28 (×8): qty 1

## 2011-07-28 MED ORDER — NITROFURANTOIN MACROCRYSTAL 50 MG PO CAPS
50.0000 mg | ORAL_CAPSULE | Freq: Four times a day (QID) | ORAL | Status: DC
  Administered 2011-07-28 – 2011-08-07 (×36): 50 mg via ORAL
  Filled 2011-07-28 (×47): qty 1

## 2011-07-28 MED ORDER — SERTRALINE HCL 100 MG PO TABS
100.0000 mg | ORAL_TABLET | Freq: Every day | ORAL | Status: DC
  Administered 2011-07-28 – 2011-07-29 (×2): 100 mg via ORAL
  Filled 2011-07-28 (×2): qty 1

## 2011-07-28 MED ORDER — OLMESARTAN MEDOXOMIL 20 MG PO TABS
20.0000 mg | ORAL_TABLET | Freq: Every day | ORAL | Status: DC
  Administered 2011-07-28 – 2011-08-21 (×24): 20 mg via ORAL
  Filled 2011-07-28: qty 10
  Filled 2011-07-28 (×24): qty 1

## 2011-07-28 MED ORDER — SODIUM CHLORIDE 0.9 % IV BOLUS (SEPSIS)
1000.0000 mL | Freq: Once | INTRAVENOUS | Status: AC
  Administered 2011-07-28: 1000 mL via INTRAVENOUS

## 2011-07-28 MED ORDER — DIVALPROEX SODIUM ER 500 MG PO TB24
1000.0000 mg | ORAL_TABLET | Freq: Every day | ORAL | Status: DC
  Administered 2011-07-28 – 2011-08-10 (×14): 1000 mg via ORAL
  Filled 2011-07-28 (×11): qty 2
  Filled 2011-07-28: qty 1
  Filled 2011-07-28 (×4): qty 2

## 2011-07-28 MED ORDER — INFLUENZA VIRUS VACC SPLIT PF IM SUSP: 0.5000 mL | INTRAMUSCULAR | Status: AC

## 2011-07-28 MED ORDER — VALSARTAN-HYDROCHLOROTHIAZIDE 160-12.5 MG PO TABS: 1.0000 | ORAL_TABLET | Freq: Every day | ORAL | Status: DC

## 2011-07-28 MED ORDER — SIMVASTATIN 10 MG PO TABS
10.0000 mg | ORAL_TABLET | Freq: Every day | ORAL | Status: DC
  Administered 2011-07-28 – 2011-08-20 (×24): 10 mg via ORAL
  Filled 2011-07-28 (×26): qty 1

## 2011-07-28 MED ORDER — PROPRANOLOL HCL 20 MG PO TABS
20.0000 mg | ORAL_TABLET | ORAL | Status: DC
  Administered 2011-07-28 – 2011-08-21 (×47): 20 mg via ORAL
  Filled 2011-07-28 (×14): qty 1
  Filled 2011-07-28: qty 20
  Filled 2011-07-28: qty 1
  Filled 2011-07-28: qty 20
  Filled 2011-07-28 (×33): qty 1

## 2011-07-28 MED ORDER — BUPROPION HCL ER (XL) 150 MG PO TB24
150.0000 mg | ORAL_TABLET | Freq: Every day | ORAL | Status: DC
  Administered 2011-07-28 – 2011-08-03 (×7): 150 mg via ORAL
  Filled 2011-07-28 (×7): qty 1

## 2011-07-28 MED ORDER — ARIPIPRAZOLE 5 MG PO TABS
5.0000 mg | ORAL_TABLET | Freq: Every day | ORAL | Status: DC
  Administered 2011-07-28 – 2011-07-29 (×2): 5 mg via ORAL
  Filled 2011-07-28 (×4): qty 1

## 2011-07-28 MED ORDER — ARIPIPRAZOLE 2 MG PO TABS: 2.0000 mg | ORAL_TABLET | Freq: Every day | ORAL | Status: DC

## 2011-07-28 MED ORDER — TRAZODONE HCL 100 MG PO TABS
100.0000 mg | ORAL_TABLET | Freq: Every day | ORAL | Status: DC
  Administered 2011-07-28 – 2011-08-17 (×21): 100 mg via ORAL
  Filled 2011-07-28 (×21): qty 1

## 2011-07-28 MED ORDER — CLONAZEPAM 1 MG PO TABS
1.0000 mg | ORAL_TABLET | ORAL | Status: DC
  Administered 2011-07-28 – 2011-07-31 (×8): 1 mg via ORAL
  Filled 2011-07-28 (×8): qty 1

## 2011-07-28 MED ORDER — CYCLOBENZAPRINE HCL 10 MG PO TABS: 5.0000 mg | ORAL_TABLET | Freq: Three times a day (TID) | ORAL | Status: DC | PRN

## 2011-07-28 NOTE — ED Notes (Signed)
Mother switched places with family member. Father at bedside now.

## 2011-07-28 NOTE — Progress Notes (Signed)
Patient admitted to unit this afternoon.  She was pleasant and cooperative with the admission process, but confused at times.  Unable to answer all admission questions and gave bizarre answers to some other questions.  She was oriented to the unit and offered fluids.  She states she has been very unsteady for several days and has trouble finding words and has fallen a few times.  Difficult to fully assess her level of understanding.  Lives with parents and thinks she will be able to go back to there at time of discharge.

## 2011-07-28 NOTE — ED Notes (Signed)
Pt has been accepted to [facility]. Report given. Pt is being transfer.

## 2011-07-28 NOTE — Progress Notes (Signed)
Per State Regulation 482.30  This chart was reviewed for medical necessity with respect to the patient's Admission/Duration of stay.   Next review due:  07/31/11   Ambrose Mantle, LCSW  07/28/2011  2:49 PM

## 2011-07-28 NOTE — ED Notes (Signed)
Patient to get this second 1L of NS and then to go back to psych ed

## 2011-07-28 NOTE — ED Notes (Signed)
Report given to Divien at [facility]. Pt is getting ready to be transported

## 2011-07-28 NOTE — ED Notes (Addendum)
Step-mother number [phone]   Step mother came to visit, and step mother updated on pt's care.

## 2011-07-28 NOTE — ED Notes (Signed)
Patient pushed code blue button in her room. Went to pt's room and explained that was for emergencies. Patient stated "Well this is an emergency. I need my clothes. My mom is here to pick me up." Explained to patient that she would be going to [facility]. This tech then noticed that pt's nicotine patch was floating in a cup of water in room. When asked why pt's patch was in her water she stated "I don't need it anymore." Patient then stated to this tech "Well aren't you full of surprises."

## 2011-07-29 ENCOUNTER — Encounter: Payer: Medicare Other | Attending: Neurosurgery | Admitting: Neurosurgery

## 2011-07-29 DIAGNOSIS — F333 Major depressive disorder, recurrent, severe with psychotic symptoms: Secondary | ICD-10-CM | POA: Diagnosis present

## 2011-07-29 MED ORDER — HALOPERIDOL 1 MG PO TABS
2.0000 mg | ORAL_TABLET | Freq: Four times a day (QID) | ORAL | Status: DC | PRN
  Administered 2011-07-29 – 2011-08-20 (×9): 2 mg via ORAL
  Filled 2011-07-29: qty 2
  Filled 2011-07-29: qty 1
  Filled 2011-07-29: qty 2
  Filled 2011-07-29: qty 1
  Filled 2011-07-29 (×3): qty 2
  Filled 2011-07-29 (×2): qty 1
  Filled 2011-07-29: qty 2

## 2011-07-29 MED ORDER — SERTRALINE HCL 50 MG PO TABS
150.0000 mg | ORAL_TABLET | Freq: Every day | ORAL | Status: DC
  Administered 2011-07-30 – 2011-08-03 (×5): 150 mg via ORAL
  Filled 2011-07-29 (×6): qty 3

## 2011-07-29 MED ORDER — ACETAMINOPHEN 325 MG PO TABS
650.0000 mg | ORAL_TABLET | Freq: Four times a day (QID) | ORAL | Status: DC | PRN
  Administered 2011-08-12 – 2011-08-18 (×4): 650 mg via ORAL

## 2011-07-29 MED ORDER — HALOPERIDOL 1 MG PO TABS: 2.0000 mg | ORAL_TABLET | Freq: Four times a day (QID) | ORAL | Status: DC | PRN

## 2011-07-29 MED ORDER — HALOPERIDOL LACTATE 5 MG/ML IJ SOLN: 2.0000 mg | Freq: Four times a day (QID) | INTRAMUSCULAR | Status: DC | PRN

## 2011-07-29 MED ORDER — ALUM & MAG HYDROXIDE-SIMETH 200-200-20 MG/5ML PO SUSP: 30.0000 mL | ORAL | Status: DC | PRN

## 2011-07-29 MED ORDER — HALOPERIDOL 1 MG PO TABS
ORAL_TABLET | ORAL | Status: AC
  Administered 2011-07-29: 2 mg via ORAL
  Filled 2011-07-29: qty 2

## 2011-07-29 MED ORDER — MAGNESIUM HYDROXIDE 400 MG/5ML PO SUSP: 30.0000 mL | Freq: Every day | ORAL | Status: DC | PRN
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
## 2011-07-29 NOTE — Progress Notes (Addendum)
Patient ID: Vanessa Hoffman, female   DOB: 03-22-1956, 55 y.o.   MRN: [id number] Pt was pleasant, but was confused, guarded, and blamed her parents for her being at [facility]. When asked what factors led to her hospitalization, pt stated that her parents aren't happy with her current boyfriend. Stated she left and went back to her ex husband, but that relationship didn't work either.  Stated she tries to clean the house, but that her stepmother has too much stuff laying around. States her stepmother tells her father that she won't clean up. Writer informed pt that reported stated, she was seeing people that wasn't there. Also that she was seeing her dead dog. Pt reluctantly admitted. Several times during the assessment pt had to be redirected to her bed, instead of the roommates. Support and encouragement was offered.

## 2011-07-29 NOTE — Progress Notes (Signed)
Patient ID: Vanessa Hoffman, female   DOB: 08-26-1955, 55 y.o.   MRN: [id number] Pt reports her depression and hopelessness as a 10/10.  She denies pain.   [name] has attended and interacted in groups today.  She slept 3.75 hours of sleep last night and reports a good appetite.  She denies SI/HI/AVH on assessment.

## 2011-07-29 NOTE — H&P (Addendum)
Psychiatric Admission Assessment Adult  Patient Identification:  Vanessa Hoffman Date of Evaluation:  07/29/2011 Chief Complaint:  Psychotic disorder NOS History of Present Illness:: This is 55 year old Caucasian female. Admitted to the [facility] from [facility] emergency room department. Pt. Reports, I started drinking again. I drank a bottle of vodka. I got irrational. I chose to go to emergency room for treatment. I need to get cleaned. My mood flares up on me when I drink. I live with my Mom and Dad. I'm on disability, about $300.00 a month, that don't pay for nothing. I don't remember much of anything. I hear voices sometimes, but I see shadows also. Now I am feeling like ...., I can't even say it out" Mood Symptoms:  Concentration Depression Mood Swings SI Depression Symptoms:  depressed mood, difficulty concentrating, impaired memory and suicidal thoughts with specific plan (Hypo) Manic Symptoms:   Elevated Mood:  "I feel it sometimes" Irritable Mood:  No Grandiosity:  No Distractibility:  Yes Labiality of Mood:  Yes Delusions:  No Hallucinations:  Yes Impulsivity:  No Sexually Inappropriate Behavior:  No Financial Extravagance:  No Flight of Ideas:  Yes  Anxiety Symptoms: Excessive Worry:  No Panic Symptoms:  No Agoraphobia:  No Obsessive Compulsive: No  Symptoms: Pt. denies Specific Phobias:  No Social Anxiety:  Pt. denies  Psychotic Symptoms:  Hallucinations:  Auditory Visual Delusions:  No Paranoia:  "I feel like people talk about me or looking at me sometimes"   Ideas of Reference:  No  PTSD Symptoms: Ever had a traumatic exposure:  No Had a traumatic exposure in the last month:  No Re-experiencing:  None Hypervigilance:  No Hyperarousal:  None Avoidance:  None  Traumatic Brain Injury: Pt. Denies.  Reviewed H&P from the ED, findings noted.  Past Psychiatric History: Diagnosis: Psychotic disorder,  Hospitalizations:[facility]  Outpatient Care: "I see Dr.  Riley Kill"  Substance Abuse Care:"I was here last Aug. For detox from Vicodin"  Self-Mutilation:Denies  Suicidal Attempts:Denies attempts, however admits SI  Violent Behaviors: Denies   Past Medical History:   Past Medical History  Diagnosis Date  . Anxiety   . Arthritis   . Depression   . Hypertension   . Fibromyalgia   . DDD (degenerative disc disease)   . Osteoporosis    History of Loss of Consciousness:  No Seizure History:  No Cardiac History:  Yes Allergies:  No Known Allergies Current Medications:  Current Facility-Administered Medications  Medication Dose Route Frequency Provider Last Rate Last Dose  . amitriptyline (ELAVIL) tablet 25 mg  25 mg Oral BH-qamhs Randy Readling, MD   25 mg at 07/29/11 0811  . ARIPiprazole (ABILIFY) tablet 5 mg  5 mg Oral QHS Franchot Gallo, MD   5 mg at 07/28/11 2141  . buPROPion (WELLBUTRIN XL) 24 hr tablet 150 mg  150 mg Oral Daily Franchot Gallo, MD   150 mg at 07/29/11 0807  . clonazePAM (KLONOPIN) tablet 1 mg  1 mg Oral [id number] Franchot Gallo, MD   1 mg at 07/29/11 0811  . cyclobenzaprine (FLEXERIL) tablet 5 mg  5 mg Oral Q8H PRN Franchot Gallo, MD      . divalproex (DEPAKOTE ER) 24 hr tablet 1,000 mg  1,000 mg Oral QHS Franchot Gallo, MD   1,000 mg at 07/28/11 2141  . hydrochlorothiazide (MICROZIDE) capsule 12.5 mg  12.5 mg Oral Daily Franchot Gallo, MD   12.5 mg at 07/29/11 0808  . influenza  inactive virus vaccine (FLUZONE/FLUARIX) injection 0.5  mL  0.5 mL Intramuscular Tomorrow-1000 Randy Readling, MD      . nitrofurantoin (MACRODANTIN) capsule 50 mg  50 mg Oral Q6H Randy Readling, MD   50 mg at 07/29/11 0640  . olmesartan (BENICAR) tablet 20 mg  20 mg Oral Daily Franchot Gallo, MD   20 mg at 07/29/11 0808  . pneumococcal 23 valent vaccine (PNU-IMMUNE) injection 0.5 mL  0.5 mL Intramuscular Tomorrow-1000 Randy Readling, MD      . propranolol (INDERAL) tablet 20 mg  20 mg Oral BH-qamhs Randy Readling, MD   20 mg at 07/29/11 0809  .  sertraline (ZOLOFT) tablet 100 mg  100 mg Oral Daily Franchot Gallo, MD   100 mg at 07/29/11 0809  . simvastatin (ZOCOR) tablet 10 mg  10 mg Oral QHS Franchot Gallo, MD   10 mg at 07/28/11 2141  . traZODone (DESYREL) tablet 100 mg  100 mg Oral QHS Franchot Gallo, MD   100 mg at 07/28/11 2141  . DISCONTD: amitriptyline (ELAVIL) tablet 25 mg  25 mg Oral BID Franchot Gallo, MD      . DISCONTD: ARIPiprazole (ABILIFY) tablet 2 mg  2 mg Oral Daily Randy Readling, MD      . DISCONTD: ARIPiprazole (ABILIFY) tablet 2 mg  2 mg Oral QHS Franchot Gallo, MD      . DISCONTD: clonazePAM (KLONOPIN) tablet 1 mg  1 mg Oral TID Franchot Gallo, MD      . DISCONTD: propranolol (INDERAL) tablet 20 mg  20 mg Oral BID Franchot Gallo, MD      . DISCONTD: simvastatin (ZOCOR) tablet 10 mg  10 mg Oral q1800 Franchot Gallo, MD      . DISCONTD: valsartan-hydrochlorothiazide (DIOVAN-HCT) 160-12.5 MG per tablet 1 tablet  1 tablet Oral Daily Franchot Gallo, MD       Facility-Administered Medications Ordered in Other Encounters  Medication Dose Route Frequency Provider Last Rate Last Dose  . DISCONTD: acetaminophen (TYLENOL) tablet 650 mg  650 mg Oral Q4H PRN Thomasene Lot, PA   650 mg at 07/27/11 1701  . DISCONTD: alum & mag hydroxide-simeth (MAALOX/MYLANTA) 200-200-20 MG/5ML suspension 30 mL  30 mL Oral PRN Thomasene Lot, PA      . DISCONTD: ibuprofen (ADVIL,MOTRIN) tablet 600 mg  600 mg Oral Q8H PRN Thomasene Lot, PA      . DISCONTD: LORazepam (ATIVAN) tablet 1 mg  1 mg Oral Q8H PRN Thomasene Lot, PA   1 mg at 07/28/11 0818  . DISCONTD: nicotine (NICODERM CQ - dosed in mg/24 hours) patch 21 mg  21 mg Transdermal Daily Thomasene Lot, PA   21 mg at 07/28/11 1000  . DISCONTD: ondansetron (ZOFRAN) tablet 4 mg  4 mg Oral Q8H PRN Thomasene Lot, PA      . DISCONTD: zolpidem (AMBIEN) tablet 5 mg  5 mg Oral QHS PRN Thomasene Lot, PA   5 mg at 07/27/11 2141    Previous Psychotropic Medications:  Medication Dose    Elavil 25 mg  Flexeril ?  HCTZ ?               Substance Abuse History in the last 12 months: Substance Age of 1st Use Last Use Amount Specific Type  Nicotine 17  2-3 cigarettes daily   Alcohol 17  I've been sober x few days 1 bottle  Vodka  Cannabis 17  I smoke occasionally   Opiates Denies recent use     Cocaine Denies use     Methamphetamines Denies use  LSD Denies use     Ecstasy Denies use     Benzodiazepines "I can't remember"     Caffeine      Inhalants      Others:                          Blackouts:  No DT's:  No Withdrawal Symptoms:  None  Social History Family Members: parents, 2 daugters Marital Status:  Separated Children:  Sons: None  Daughters: 2 Relationships: Separated x 2 years. Occupational Experiences; disabled   Family History:  Depression: Parents.  Mental Status Examination/Evaluation: Objective:  Appearance: Fairly Groomed  Patent attorney::  Fair  Speech:  Slow  Volume:  Decreased  Mood:  I'm fine now"  Affect:  Congruent  Thought Process:  Irrational  Orientation:  Other:  Oriented to name and place only.  Thought Content:  Hallucinations: Auditory Visual  Suicidal Thoughts:  Present without plans and or intent.  Homicidal Thoughts:  No  Judgement:  Impaired  Insight:  Lacking  Psychomotor Activity:  Decreased  Akathisia:  No  Handed:  Right               Assessment:                                 AXIS I Psychotic disorder NOS, Polysubstance abuse D/O  AXIS II Deferred  AXIS III Past Medical History  Diagnosis Date  . Anxiety   . Arthritis   . Depression   . Hypertension   . Fibromyalgia   . DDD (degenerative disc disease)   . Osteoporosis      AXIS IV economic problems  AXIS V 21-30 behavior considerably influenced by delusions or hallucinations OR serious impairment in judgment, communication OR inability to function in almost all areas    Treatment Plan Summary: Daily contact with patient to assess  and evaluate symptoms and progress in treatment Medication management  Observation Level/Precautions:  15 minutes checks for safety    Psychotherapy: Group counseling and activities  Laboratory: Reviewed lab. Findings from the ED. Antibiotic therapy for urinary tract infection in progress.  Routine PRN Medications:  Yes    Discharge Concerns: Setting up out-patient care for follow-up and routine care.  Other:      [name] 12/5/20129:14 AM

## 2011-07-29 NOTE — ED Provider Notes (Signed)
Medical screening examination/treatment/procedure(s) were performed by non-physician practitioner and as supervising physician I was immediately available for consultation/collaboration.  Toy Baker, MD 07/29/11 [phone]

## 2011-07-30 DIAGNOSIS — F29 Unspecified psychosis not due to a substance or known physiological condition: Secondary | ICD-10-CM

## 2011-07-30 LAB — URINE CULTURE
Colony Count: >=100000
Culture  Setup Time: 201212040911

## 2011-07-30 LAB — VALPROIC ACID LEVEL: Valproic Acid Lvl: 91.5 ug/mL (ref 50.0–100.0)

## 2011-07-30 MED ORDER — RISPERIDONE 2 MG PO TABS
2.0000 mg | ORAL_TABLET | Freq: Every day | ORAL | Status: DC
  Administered 2011-07-30: 2 mg via ORAL
  Filled 2011-07-30 (×3): qty 1

## 2011-07-30 NOTE — Progress Notes (Addendum)
Interdisciplinary Treatment Plan Update (Adult)  Date:  07/30/2011  Time Reviewed:  9:47 AM   Progress in Treatment: Attending groups:   Yes   Participating in groups:  Yes Taking medication as prescribed:  Yes Tolerating medication:  Yes Family/Significant other contact made: Not yet, but will be made with parents Patient understands diagnosis:  Yes Discussing patient identified problems/goals with staff: Yes Medical problems stabilized or resolved: Yes Denies suicidal/homicidal ideation:Yes Issues/concerns per patient self-inventory: None Other:  New problem(s) identified:  She is taking her roommate's clothes, but is saying that her roommate is taking her clothes.  Not allowed to go to cafeteria yesterday evening due to agitation, and after dinner, roommate returned to find the patient in roommate's clothing.  Is confused  Reason for Continuation of Hospitalization: Anxiety Depression Hallucinations Medication stabilization Suicidal ideation Confusion  Interventions implemented related to continuation of hospitalization: Medication Management; safety checks q 15 mins Therapy groups Psychoeducation Collateral contact  Additional comments:  CAT scan has been ordered due to reported history of head injury and patient's confusion.  Estimated length of stay:  5-6 days  Discharge Plan:  Return to live with parents, will know more about potential aftercare after obtaining collateral information from them  New goal(s):  Reduce confusion, i.e. Patient will know which clothes are hers  Review of initial/current patient goals per problem list:   1.  Goal(s): Eliminate SI  Met:  No  Target date: d/c  As evidenced by:  Vanessa Hoffman will report no longer experience SI - still has SI currently  2.  Goal (s):  Reduce depression  Met:  No  Target date:  d/c  As evidenced by:  Patient will rate depression significantly lower than ten, today is 10  3.  Goal(s):  Eliminate or  reduce psychosis  Met:  No  Target date:  d/c  As evidenced by:  Vanessa Hoffman will report no longer be having psychotic symptoms or experiencing on a   less frequent basis-- is still carrying on conversations with unseen people  4.  Goal(s):  Stabilize on medications  Met:  No  Target date: d/c As evidenced by: Vanessa Hoffman will report medications are working, med adjustments still required Attendees: Patient:     Family:     Physician:  Franchot Gallo, MD 07/30/2011 9:47 AM   Nursing:   Robbie Louis, RN 07/30/2011 9:47 AM   CaseManager:  Juline Patch, LCSW 07/30/2011 9:47 AM      Other:  Ambrose Mantle, LCSW 07/30/2011 9:47 AM   Other:  Izola Price, RN 07/30/2011 9:47 AM   Other:  Nanine Means, RN, MSN 07/30/2011 9:47 AM   Other:      Scribe for Treatment Team:   Wynn Banker, LCSW,  07/30/2011 9:47 AM

## 2011-07-30 NOTE — Progress Notes (Signed)
Patient up and wandering the halls much of the day.  Has had to be redirected out of other patients rooms.  She has been seen picking up other patients clothing.  Confused about where she is and the date.  Appears to be responding to internal stimuli.  Frequently seen engaged in conversation with no one else around.  Has also made several attempts to leave the unit.  Placed on elopement precautions and is having meals on the unit.  Will be going for a CT scan in the near future.  Explained what the test was and why it was being done, but patient did not seem to understand what I was telling her.

## 2011-07-30 NOTE — Progress Notes (Signed)
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   The patient was seen today and reports the following:   Sleep: The patient reports to sleeping well last night.  Appetite: Poor.   Mild>(1-10) >Severe  Hopelessness (1-10): 10  Depression (1-10): 8  Anxiety (1-10): 8   Suicidal Ideation: The patient reports ongoing suicidal ideations today but with no plan or intent.  Plan: No  Intent: No  Means: No   Homicidal Ideation: The patient adamantly denies any homicidal ideations today.  Plan: No  Intent: No.  Means: No   Eye Contact: Fair.  General Appearance Luretha Murphy: Dishevel  Motor Behavior: Normal  Speech: WNL  Mental Status: AO x 3  Level of Consciousness: Alert but confused. Mood: Severely Depressed.  Affect: Flat.  Anxiety: Severe.  Thought Process: Delusional.  Thought Content:  The patient reports delusional thinking that her parents were married last night and she was not invited to the [place].  She also states that others are hiding her clothes and her roommate is stealing her items. Perception:  Delusional.  Judgment: Poor.  Insight: Poor.  Cognition: Orientation time, place and person.  Sleep:  Number of Hours: 6.75   Treatment Plan Summary:  1. Daily contact with patient to assess and evaluate symptoms and progress in treatment  2. Medication management  3. The patient will deny suicidal ideations or homicidal ideations for 48 hours prior to discharge and have a depression and anxiety rating of 3 or less. The patient will also deny any auditory or visual hallucinations or delusional thinking or display any manic or hypomanic behaviors.   Plan:  1. Will continue current medications.  2. Will increase Zoloft to 150 mgs po q am to address depressive symptoms.  3. Haldol 2 mg po or IM q 6 hours for agitation.  4. Will discontinue Abilify and start Risperdal 2 mgs po qhs for delusional thinking.    5. Will continue to monitor.    Janaia Kozel 07/30/2011, 12:10 PM

## 2011-07-30 NOTE — Progress Notes (Signed)
Patient ID: Vanessa Hoffman, female   DOB: March 27, 1956, 55 y.o.   MRN: [id number] "My mom is having a baby. My dad won't let me see my baby brother", while crying.  Later pt asked the writer about getting her keys, so she could leave. Writer explained that she would have to stay thru the night and could only be discharged with the dr's approval.

## 2011-07-30 NOTE — Progress Notes (Signed)
Pt had some belongings brought in by family or friend. Pt was given item that was appropriate for unit. Staff locked up belt for robe, yarn with needle, and make-up items.Marland Kitchen

## 2011-07-31 ENCOUNTER — Ambulatory Visit (HOSPITAL_COMMUNITY)
Admission: RE | Admit: 2011-07-31 | Discharge: 2011-07-31 | Disposition: A | Discharge status: Home / Self Care | Payer: PRIVATE HEALTH INSURANCE | Source: Ambulatory Visit | Attending: Psychiatry | Admitting: Psychiatry

## 2011-07-31 MED ORDER — IOHEXOL 300 MG/ML  SOLN
100.0000 mL | Freq: Once | INTRAMUSCULAR | Status: AC | PRN
  Administered 2011-07-31: 100 mL via INTRAVENOUS

## 2011-07-31 MED ORDER — CLONAZEPAM 0.5 MG PO TABS
0.5000 mg | ORAL_TABLET | ORAL | Status: DC
  Administered 2011-07-31 – 2011-08-04 (×11): 0.5 mg via ORAL
  Filled 2011-07-31 (×11): qty 1

## 2011-07-31 MED ORDER — RISPERIDONE 2 MG PO TABS
4.0000 mg | ORAL_TABLET | Freq: Every day | ORAL | Status: DC
  Administered 2011-07-31 – 2011-08-05 (×6): 4 mg via ORAL
  Filled 2011-07-31 (×8): qty 2

## 2011-07-31 NOTE — Progress Notes (Signed)
[facility] Group Notes:  (Counselor/Nursing/MHT/Case Management/Adjunct)  07/31/2011 1:04 PM  Type of Therapy:  Group therapy  Participation Level:  Minimal  Participation Quality:  Attentive  Affect:  Depressed  Cognitive:  Confused  Insight:  None  Engagement in Group:  Limited  Engagement in Therapy:  Limited  Modes of Intervention:  Education and Support  Summary of Progress/Problems: Patient was not logical in interactions. She asked if there could be candy which had no relevance to discussion. Also made several other statements demonstrating confusion.   Byron Peacock, Aram Beecham 07/31/2011, 1:04 PM

## 2011-07-31 NOTE — Progress Notes (Addendum)
Patient resting quietly with her eyes closed at the beginning of this shift. Writer woke patient up and administered her 1200 am dosage of antibiotic. Patient received medication without difficulty and returned to sleep. Q 15 min checks continues as ordered.  77: Mental Health Tech reported that she heard a sound from patient's room and went and checked on patient;  found patient on the floor. Patient reported that she had urge to use the bathroom, dripped on the floor and slid on her urine and fell. She denied hitting her head, dizziness, headache or any injury.  Writer assessed patient; no bump or bleeding noted. Assessed V/S sitting: 102/75, Pulse 83, Temp 97.5, 02 sat 97 RA. Standing 101/69, Pulse 80. Staff assisted patient to the bathroom and helped her back to her bed, pulled one railing up on the wet side and place dry towel on the floor. As Clinical research associate was about to walk out of the room, pt stated "I think I bump my head when I fell". Writer asked her where and patient said ;"on the rail". Writer told patient we just pulled the rail up to prevent you from falling again. Re-assessed patient, palpated patient's head again and no bump noted. Physician on call Verne Spurr,  notified. She advised staff to monitor patient's blood pressure. 15 min checks continues as ordered to maintain safety.  1610: rechecked patient V/S as ordered; and it was within normal limit. 97/66 and pulse 73 , temp 97.7 and 02 sat 96.7

## 2011-07-31 NOTE — Progress Notes (Signed)
Patient ID: Vanessa Hoffman, female   DOB: [birth year], 55 y.o.   MRN: [id number] Pt presenting with extreme confusion/disorientation. Is going into other's rooms and constant redirection needed. No aggressive behaviors. Ordered CT implemented and reported to provider.  Appetite is good and is receptive to redirection. Moved to "do not admit room" and cont assessment and reorient to reality.

## 2011-07-31 NOTE — Progress Notes (Signed)
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  R/O Vascular Dementia.  The patient was seen today and reports the following:   Sleep: The patient reports to sleeping well last night but according to staff she was up most of the night wandering into other's room.  Appetite: Fair.   Mild>(1-10) >Severe  Hopelessness (1-10): 10  Depression (1-10): 10  Anxiety (1-10): 10   Suicidal Ideation: The patient reports ongoing suicidal ideations today but with no plan or intent.  Plan: No  Intent: No  Means: No   Homicidal Ideation: The patient adamantly denies any homicidal ideations today.  Plan: No  Intent: No.  Means: No   Eye Contact: Fair.  General Appearance Vanessa Hoffman: Dishevel and confused. Motor Behavior: Restless.  Speech: Mumbled.  Mental Status: Confused but oriented to self only.   Level of Consciousness: Drowsy but confused.  Mood: Severely Depressed.  Affect: Flat.  Anxiety: Severe.  Thought Process: Delusional.  Thought Content: The patient reports delusional thinking that others are hiding her clothes and her roommate is stealing her items.  Perception: Delusional.  Judgment: Poor.  Insight: Poor.  Cognition: Orientation time, place and person.  Sleep:  Number of Hours: 4.75   Vital Signs:Blood pressure 104/68, pulse 78, temperature 98.3 F (36.8 C), temperature source Oral, resp. rate 18.  Lab Results:  Results for orders placed during the hospital encounter of 07/28/11 (from the past 48 hour(s))  VALPROIC ACID LEVEL     Status: Normal   Collection Time   07/30/11  8:03 PM      Component Value Range Comment   Valproic Acid Lvl 91.5  50.0 - 100.0 (ug/mL)    *RADIOLOGY REPORT*  Clinical Data: History of head trauma. Mental status change  CT HEAD WITHOUT AND WITH CONTRAST  Technique: Contiguous axial images were obtained from the base of  the skull through the vertex without and with  intravenous contrast.  Contrast: OMNIPAQUE IOHEXOL 300 MG/ML IV SOLN  Comparison: CT 12/17/2007  Findings: Ventricle size is normal. Mild atrophy. Mild patchy  hypodensity in the cerebral white matter bilaterally is similar to  the prior study. This is most compatible chronic microvascular  ischemia. Negative for acute infarct. Negative for hemorrhage or  mass.  Post contrast imaging reveals normal enhancement. No enhancing  lesions are identified.  IMPRESSION:  Mild chronic changes in the white matter are stable from prior  study. No acute abnormality. Negative for mass lesion.  Treatment Plan Summary:  1. Daily contact with patient to assess and evaluate symptoms and progress in treatment  2. Medication management  3. The patient will deny suicidal ideations or homicidal ideations for 48 hours prior to discharge and have a depression and anxiety rating of 3 or less. The patient will also deny any auditory or visual hallucinations or delusional thinking or display any manic or hypomanic behaviors.   Plan:  1. Will continue current medications.  2. Will increase Risperdal to 4 mgs po qhs for delusional thinking.  3. Will continue to monitor. 4. Will make the patient a Do Not Admit.   Vanessa Hoffman 07/31/2011, 3:45 PM

## 2011-07-31 NOTE — Progress Notes (Addendum)
Writer spoke with patient's parents and stepmother.  Father and stepmother advised patient has been living with them but cannot return to the home at discharge.  They are requesting that an assisted living placement be pursued.  MD advised and placement will be explored as patient gets closer to discharge.  Patient's mother advised patient can not live with her at discharge.  Per state Regulations 482.30 This chart was reviewed for medical necessity with Respect to the patient's Admission/Duration of stay.  Mililani Murthy, LCSW  07/31/2011 Next review date 08/03/11

## 2011-08-01 NOTE — Progress Notes (Signed)
[facility] Group Notes:  (Counselor/Nursing/MHT/Case Management/Adjunct)  08/01/2011 11 AM  Type of Therapy:  Group Therapy, Dance/Movement Therapy   Participation Level:  Minimal  Participation Quality:  Attentive and Sharing  Affect:  Appropriate and Flat  Cognitive:  Confused, Delusional and Hallucinating  Insight:  None  Engagement in Group:  Limited  Engagement in Therapy:  Limited  Modes of Intervention:  Clarification, Problem-solving, Role-play, Socialization and Support  Summary of Progress/Problems: pt spoke about a personal accomplishment of "putting soap in a dish" and practiced embodying this positive feeling and remembering the accomplishment. Pt developed a plan to embody this more so as to feel more accomplished and increase her personal feelings of well being now at [facility] and upon D/C.    Vanessa Hoffman

## 2011-08-01 NOTE — Progress Notes (Signed)
[facility] MD Progress Note  08/01/2011 4:24 PM  Diagnosis:   Axis I: Major Depression, Recurrent severe with psychotic features. Axis II: Deferred Axis III:  Past Medical History  Diagnosis Date  . Anxiety   . Arthritis   . Depression   . Hypertension   . Fibromyalgia   . DDD (degenerative disc disease)   . Osteoporosis    Axis IV: economic problems Axis V: 21-30 behavior considerably influenced by delusions or hallucinations OR serious impairment in judgment, communication OR inability to function in almost all areas  ADL's:  Impaired  Sleep:  No  Appetite:  Poor  Suicidal Ideation:   Plan:  No  Intent:  No  Means:  No  Homicidal Ideation:   Plan:  No  Intent:  No  Means:  No  AEB (as evidenced by):  Mental Status: General Appearance Vanessa Hoffman:  Casual Eye Contact:  Minimal Motor Behavior:  Restlestness Speech:  Garbled Level of Consciousness:  Alert Mood:  Depressed and Dysphoric Affect:  Blunt Anxiety Level:  Moderate Thought Process:  Disorganized Thought Content:  Delusions Perception:  Hallucinations and Illusions Judgment:  Poor Insight:  Absent Cognition:  No Sleep:  Number of Hours: 4.75   Vital Signs:Blood pressure 90/60, pulse 82, temperature 98 F (36.7 C), temperature source Oral, resp. rate 16, SpO2 97.00%.  Lab Results:  Results for orders placed during the hospital encounter of 07/28/11 (from the past 48 hour(s))  VALPROIC ACID LEVEL     Status: Normal   Collection Time   07/30/11  8:03 PM      Component Value Range Comment   Valproic Acid Lvl 91.5  50.0 - 100.0 (ug/mL)     Treatment Plan Summary: Daily contact with patient to assess and evaluate symptoms and progress in treatment Medication management  Plan: Will continue patient on current treatment plan.           Will continue Q 15 minutes checks for safety.  [name] 08/01/2011, 4:24 PM

## 2011-08-01 NOTE — Progress Notes (Signed)
Patient ID: Vanessa Hoffman, female   DOB: Sep 11, 1955, 55 y.o.   MRN: [id number] The patient spent the evening in her room in bed. Her speech was soft and her thoughts were disorganized. Compliant with medication. Denies any auditory/visual hallucinations, but appears preoccupied.

## 2011-08-01 NOTE — Progress Notes (Signed)
Patient ID: Vanessa Hoffman, female   DOB: 1956/07/29, 55 y.o.   MRN: [id number] 08-01-11 pt is very confused but is easily redirectable. She did deny any suicide ideation and pain. She is unable to answer RN's question coherently. RN assisted pt with inventory sheet. She stated she slept well and previous shift said she slept 8 hrs. She ate breakfast and her energy level is low. Attention is poor, depression 7 and hopelessness 8. Withdrawal symptoms not applicable and no physical problems. She stated changes she want to make a d/c are " take a nice bath". She stated she would have problems staying on medicationss at home because "they are not good for me". RN held the following medications: inderal, benicar and hctz per the PA. This patient's b/p was low and she is a fall risk. RN will continue to monitor and safety checks continue every 15 minutes.

## 2011-08-01 NOTE — Progress Notes (Signed)
Patient ID: Vanessa Hoffman, female   DOB: [birth year], 55 y.o.   MRN: [id number] The patient stayed in bed all evening. She was pleasant but not oriented to time or place. Her thoughts were disorganized. Compliant with medication.

## 2011-08-01 NOTE — Progress Notes (Signed)
Patient ID: Vanessa Hoffman, female   DOB: May 05, 1956, 55 y.o.   MRN: [id number] The patient is disoriented to time and place. She is confused and disorganized. Wanders in the hallway, looking for her mother's wedding dress. Needs constant redirection.

## 2011-08-02 NOTE — Progress Notes (Signed)
Patient ID: Vanessa Hoffman, female   DOB: 07-Jan-1956, 55 y.o.   MRN: [id number] No distress noted, eyes closed, resp. Even. Staff will continue q40min checks for safety.

## 2011-08-02 NOTE — Progress Notes (Signed)
Pt is confused and delusional   She had a blanket in the hallway and told staff there was a squid inside  Then she said it was a Industrial/product designer   She is carrying the blanket around in her arms like a baby    She is blunted with stiff movements   She is compliant with treatment and needs frequent redirection   She roams in other rooms  And picks things of other pts up   Verbal support given  Medications administered and effectiveness monitored  Q 15 min checks   Pt safe at present

## 2011-08-02 NOTE — Progress Notes (Signed)
[facility] MD Progress Note  08/02/2011 12:16 PM  Diagnosis:   Axis I: Major Depression, Recurrent severe,with psychotic features. Axis II: Deferred Axis III:  Past Medical History  Diagnosis Date  . Anxiety   . Arthritis   . Depression   . Hypertension   . Fibromyalgia   . DDD (degenerative disc disease)   . Osteoporosis    Axis IV: No changes Axis V: 21-30 behavior considerably influenced by delusions or hallucinations OR serious impairment in judgment, communication OR inability to function in almost all areas  ADL's:  Impaired  Sleep:  1.61 hours  Appetite:  Fair  Suicidal Ideation:   Plan:  No  Intent:  No  Means:  No  Homicidal Ideation:   Plan:  No  Intent:  No  Means:  No  AEB (as evidenced by):  Mental Status: General Appearance Vanessa Hoffman:  Casual Eye Contact:  Poor Motor Behavior:  Restlestness Speech:  Garbled Level of Consciousness:  Alert Mood:  Dysphoric Affect:  Inappropriate, blunt Anxiety Level:  Moderate Thought Process:  Disorganized Thought Content:  Delusions Perception:  Hallucinations Judgment:  Poor Insight:  Absent Cognition:  Orientation person Sleep:  Number of Hours: 5.25   Vital Signs:Blood pressure 90/60, pulse 82, temperature 98 F (36.7 C), temperature source Oral, resp. rate 16, SpO2 97.00%.  Lab Results: No results found for this or any previous visit (from the past 48 hour(s)).  Treatment Plan Summary: Daily contact with patient to assess and evaluate symptoms and progress in treatment Medication management  Plan: Will continue patient on current treatment plan.           Continue Q 15 minutes checks for safety.  [name] 08/02/2011, 12:16 PM

## 2011-08-02 NOTE — Progress Notes (Signed)
[facility] Group Notes:  (Counselor/Nursing/MHT/Case Management/Adjunct)  08/02/2011 11 AM  Type of Therapy:  Group Therapy, Dance/Movement Therapy   Participation Level:  Active  Participation Quality:  Appropriate  Affect:  Anxious  Cognitive:  Appropriate  Insight:  Limited  Engagement in Group:  Limited  Engagement in Therapy:  Limited  Modes of Intervention:  Clarification, Problem-solving, Role-play, Socialization and Support  Summary of Progress/Problems:Pt participated in a group discussion on how to give and receive positive support. Pt identified her mother and father as a personal positive support. Pt practiced slow sustained movements and imagined taking in positive energy and pushing out negative energy by moving their hands into their chest and pushing them away from their chest to show and feel positive support for themselves.    [name]

## 2011-08-02 NOTE — Progress Notes (Signed)
Patient ID: Vanessa Hoffman, female   DOB: 1956/04/07, 55 y.o.   MRN: [id number] Initiated 1:1 due to patient confusion, hitting visitors of other patients, wandering in others rooms. Writer received order from Dr. Lolly Mustache. Writer explained that she will have someone remain with her one a one two basis, pt. Nodded in agreement.  Staff will continue to monitor 1:1.

## 2011-08-03 DIAGNOSIS — F0150 Vascular dementia without behavioral disturbance: Secondary | ICD-10-CM | POA: Diagnosis present

## 2011-08-03 MED ORDER — MAGNESIUM HYDROXIDE 400 MG/5ML PO SUSP: 30.0000 mL | Freq: Every day | ORAL | Status: DC | PRN

## 2011-08-03 MED ORDER — SERTRALINE HCL 100 MG PO TABS
200.0000 mg | ORAL_TABLET | Freq: Every day | ORAL | Status: DC
  Administered 2011-08-04 – 2011-08-21 (×18): 200 mg via ORAL
  Filled 2011-08-03: qty 2
  Filled 2011-08-03: qty 20
  Filled 2011-08-03 (×18): qty 2

## 2011-08-03 MED ORDER — TAB-A-VITE/IRON PO TABS
1.0000 | ORAL_TABLET | Freq: Every day | ORAL | Status: DC
  Administered 2011-08-03 – 2011-08-21 (×19): 1 via ORAL
  Filled 2011-08-03 (×14): qty 1
  Filled 2011-08-03: qty 10
  Filled 2011-08-03 (×6): qty 1

## 2011-08-03 NOTE — Progress Notes (Signed)
Recreation Therapy Group Note  Date: 08/03/2011         Time: 0930      Group Topic/Focus: The focus of this group is on enhancing the patient's understanding of leisure, barriers to leisure, and the importance of engaging in positive leisure activities upon discharge for improved total health.  Participation Level: Minimal  Participation Quality: Intrusive  Affect: Blunted  Cognitive: Confused   Additional Comments: Patient made bizarre statements about getting stuck on a train over the weekend, left group to speak with MD and didn't return.   Vanessa Hoffman 08/03/2011 11:56 AM

## 2011-08-03 NOTE — Discharge Planning (Signed)
Met with patient in Aftercare Planning Group, where she was pleasant but delusional and somewhat difficult to understand.  Speech was garbled at points.  Is on 1:1 for safety, and responded "Sure" when Case Manager asked her if the MHT was kind of sticking close to her.  Smiled at this as well.  The Treatment Team briefly discussed patient, and talked about what kind of placement might be necessary for patient.  Case Manager to start FL-2 process today, seeking a potential placement for a dementia bed.  Per State Regulation 482.30  This chart was reviewed for medical necessity with respect to the patient's Admission/Duration of stay.   Next review due:  08/06/11   Ambrose Mantle, LCSW  08/03/2011  12:25 PM

## 2011-08-03 NOTE — Progress Notes (Signed)
[facility] MD Progress Note  08/03/2011 1:15 PM  Diagnosis:  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  R/O Vascular Dementia.  R/O Acute Delirium.  The patient was seen today and reports the following:   Sleep: The patient reports to sleeping well last night.  Appetite: Fair.   Mild>(1-10) >Severe  Hopelessness (1-10): 10  Depression (1-10): 10  Anxiety (1-10): 10   Suicidal Ideation: The patient reports ongoing suicidal ideations today but with no plan or intent.  Plan: No  Intent: No  Means: No   Homicidal Ideation: The patient adamantly denies any homicidal ideations today.  Plan: No  Intent: No.  Means: No   Eye Contact: Fair.  General Appearance Vanessa Hoffman: Dishevel and confused.  Motor Behavior: Restless.  Speech: Mumbled.  Mental Status: Confused but oriented to self only.  Level of Consciousness: Alert but confused.  Mood: Severely Depressed.  Affect: Flat.  Anxiety: Severe from patient's report.  Thought Process: Delusional.  Thought Content: The patient reports ongoing delusional thinking.  She states "I can't cut meat but can cook it."  Another time she stated that a Gofer had hit her on the head.  Perception: Delusional.  Judgment: Poor.  Insight: Poor.  Cognition: Orientation time, place and person.  Sleep:  Number of Hours: 5.75   Vital Signs:Blood pressure 88/59, pulse 73, temperature 97.4 F (36.3 C), temperature source Oral, resp. rate 20, SpO2 97.00%.  Lab Results: No results found for this or any previous visit (from the past 48 hour(s)).  *RADIOLOGY REPORT*  Clinical Data: History of head trauma. Mental status change  CT HEAD WITHOUT AND WITH CONTRAST  Technique: Contiguous axial images were obtained from the base of  the skull through the vertex without and with intravenous contrast.  Contrast: OMNIPAQUE IOHEXOL 300 MG/ML IV SOLN  Comparison: CT 12/17/2007  Findings: Ventricle size is normal. Mild atrophy. Mild patchy    hypodensity in the cerebral white matter bilaterally is similar to  the prior study. This is most compatible chronic microvascular  ischemia. Negative for acute infarct. Negative for hemorrhage or  mass.  Post contrast imaging reveals normal enhancement. No enhancing  lesions are identified.  IMPRESSION:  Mild chronic changes in the white matter are stable from prior  study. No acute abnormality. Negative for mass lesion.   Treatment Plan Summary:  1. Daily contact with patient to assess and evaluate symptoms and progress in treatment  2. Medication management  3. The patient will deny suicidal ideations or homicidal ideations for 48 hours prior to discharge and have a depression and anxiety rating of 3 or less. The patient will also deny any auditory or visual hallucinations or delusional thinking or display any manic or hypomanic behaviors.   Plan:  1. Will continue current medications.  2. Will discontinue Wellbutrin XL and Elavil to help minimize the number of medications the patient is taking.  The patient does appear to possibly be in a delirium.  3. Will continue to monitor.  4. Will continue the patient as a Do Not Admit.  5. Will continue 1:1 with the patient. 6. Will increase Zoloft to 200 mgs po qam for depression. 7. Will start a multivitamin with Iron to address mild anemia.   Vanessa Hoffman 08/03/2011, 1:15 PM

## 2011-08-04 LAB — DIFFERENTIAL
Basophils Relative: 0 % (ref 0–1)
Lymphocytes Relative: 26 % (ref 12–46)
Lymphs Abs: 2.5 10*3/uL (ref 0.7–4.0)
Monocytes Absolute: 0.9 10*3/uL (ref 0.1–1.0)
Monocytes Relative: 9 % (ref 3–12)
Neutro Abs: 5.6 10*3/uL (ref 1.7–7.7)
Neutrophils Relative %: 59 % (ref 43–77)

## 2011-08-04 LAB — COMPREHENSIVE METABOLIC PANEL
AST: 16 U/L (ref 0–37)
Albumin: 3.8 g/dL (ref 3.5–5.2)
Alkaline Phosphatase: 50 U/L (ref 39–117)
BUN: 30 mg/dL — ABNORMAL HIGH (ref 6–23)
CO2: 31 mEq/L (ref 19–32)
Chloride: 99 mEq/L (ref 96–112)
Creatinine, Ser: 1.3 mg/dL — ABNORMAL HIGH (ref 0.50–1.10)
GFR calc non Af Amer: 46 mL/min — ABNORMAL LOW (ref >90)
Potassium: 3.6 mEq/L (ref 3.5–5.1)
Total Bilirubin: 0.1 mg/dL — ABNORMAL LOW (ref 0.3–1.2)

## 2011-08-04 LAB — CBC
HCT: 31.7 % — ABNORMAL LOW (ref 36.0–46.0)
Hemoglobin: 10.7 g/dL — ABNORMAL LOW (ref 12.0–15.0)
RBC: 3.39 MIL/uL — ABNORMAL LOW (ref 3.87–5.11)
WBC: 9.4 10*3/uL (ref 4.0–10.5)

## 2011-08-04 MED ORDER — DONEPEZIL HCL 5 MG PO TABS
5.0000 mg | ORAL_TABLET | Freq: Every day | ORAL | Status: DC
  Administered 2011-08-04 – 2011-08-13 (×10): 5 mg via ORAL
  Filled 2011-08-04 (×11): qty 1

## 2011-08-04 MED ORDER — CLONAZEPAM 0.5 MG PO TABS
0.5000 mg | ORAL_TABLET | ORAL | Status: DC
  Administered 2011-08-04 – 2011-08-05 (×2): 0.5 mg via ORAL
  Filled 2011-08-04 (×2): qty 1

## 2011-08-04 NOTE — Progress Notes (Signed)
Patient ID: Vanessa Hoffman, female   DOB: 07-Sep-1955, 55 y.o.   MRN: [id number]  Pt was pleasant and clearer today. Wasn't shuffling, as figidty, and behavior wasn't as delusional. However, writer still found it difficult to understand what the pt was saying.   Was laying in bed resting calmly  and took her scheduled meds without incident. Support and encouragement was offered.

## 2011-08-04 NOTE — Progress Notes (Signed)
See clipboard at bedside, patient is on 1:1.

## 2011-08-04 NOTE — Progress Notes (Signed)
[facility] Group Notes:  (Counselor/Nursing/MHT/Case Management/Adjunct)  08/04/2011 12:25 PM  Type of Therapy:  Group Therapy  Participation Level:  Minimal  Participation Quality:  Attentive and Sharing  Affect:  Depressed  Cognitive:  Confused  Insight:  None  Engagement in Group:  Limited  Engagement in Therapy:  Limited  Modes of Intervention:  Orientation and Support  Summary of Progress/Problems: Patient continued to be confused and illogical in conversation.   [name] 08/04/2011, 12:25 PM

## 2011-08-04 NOTE — Progress Notes (Signed)
[facility] Group Notes:  (Counselor/Nursing/MHT/Case Management/Adjunct)  08/04/2011 12:23 PM  Type of Therapy:  Group Therapy (08/03/2011) Participation Level:  Minimal  Participation Quality:  Attentive and Sharing  Affect:  Depressed  Cognitive:  Confused  Insight:  None  Engagement in Group:  Limited  Engagement in Therapy:  Limited  Modes of Intervention:  Orientation and Support  Summary of Progress/Problems: Patient was confused and conversation was not logical.   Vanessa Hoffman 08/04/2011, 12:23 PM

## 2011-08-04 NOTE — Progress Notes (Signed)
[facility] MD Progress Note  08/04/2011 12:54 PM  Diagnosis:  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  R/O Vascular Dementia.  R/O Acute Delirium.   The patient was seen today and reports the following:   Sleep: The patient reports to sleeping well last night.  Appetite: Fair.   Mild>(1-10) >Severe  Hopelessness (1-10): 8  Depression (1-10): 8  Anxiety (1-10): 3   Suicidal Ideation: The patient denies any suicidal ideations today. Plan: No  Intent: No  Means: No   Homicidal Ideation: The patient adamantly denies any homicidal ideations today.  Plan: No  Intent: No.  Means: No   Eye Contact: Fair.  General Appearance Vanessa Hoffman: Dishevel and confused.  Motor Behavior: Restless.  Speech: Mumbling.  Mental Status: Confused and oriented to self only.  Level of Consciousness: Alert but confused.  Mood: Severely Depressed.  Affect: Flat.  Anxiety: Mild.  Thought Process: Confused.  Thought Content:  No auditory or visual hallucinations reported or observed.  No delusional thinking but significantly confused. Perception: Delusional.  Judgment: Poor.  Insight: Poor.  Cognition: Orientation time, place and person.  Sleep:  Number of Hours: 6.5   Vital Signs:Blood pressure 104/72, pulse 68, temperature 97.7 F (36.5 C), temperature source Oral, resp. rate 18, weight 68.13 kg (150 lb 3.2 oz), SpO2 97.00%.  Lab Results: No results found for this or any previous visit (from the past 48 hour(s)).  CT OF HEAD: IMPRESSION:  Mild chronic changes in the white matter are stable from prior  study. No acute abnormality. Negative for mass lesion.   Treatment Plan Summary:  1. Daily contact with patient to assess and evaluate symptoms and progress in treatment  2. Medication management  3. The patient will deny suicidal ideations or homicidal ideations for 48 hours prior to discharge and have a depression and anxiety rating of 3 or less. The patient will also deny any  auditory or visual hallucinations or delusional thinking or display any manic or hypomanic behaviors.   Plan:  1. Will continue current medications.  2. Will decrease Klonopin to 0.5 mgs po q am and hs. 3. Will start Aricept 5 mgs po qhs for cognition. 4. Labs including CBC with Diff, CMP, TSH, Free T3, Free T4, B12/Folate and Vitamin D to assess metabolic state. 5. Continue to monitor.  Vanessa Hoffman 08/04/2011, 12:54 PM

## 2011-08-04 NOTE — Tx Team (Signed)
Interdisciplinary Treatment Plan Update (Adult)  Date:  08/04/2011  Time Reviewed:  10:04 AM   Progress in Treatment: Attending groups:  Yes Participating in groups:    Yes, although inappropriate at times, has to be redirected by group leader or 1:1 MHT Taking medication as prescribed:    Yes Tolerating medication:   Yes, no side effects noted or reported Family/Significant other contact made:  Yes, with father Patient understands diagnosis:   No Discussing patient identified problems/goals with staff:   Yes, but illogical Medical problems stabilized or resolved:   Yes Denies suicidal/homicidal ideation:  Yes Issues/concerns per patient self-inventory:   None Other:  New problem(s) identified: Yes, Describe:  Is on lots of meds from home, possibly start removing some of these to see if there is any delirium involved  Reason for Continuation of Hospitalization: Delusions  Depression Hallucinations Medication stabilization  Interventions implemented related to continuation of hospitalization:  Medication monitoring and adjustment, safety checks Q15 min., group therapy, psychoeducation, collateral contact  Additional comments:  Remains on 1:1 for safety  Estimated length of stay:  5-7 days  Discharge Plan:  Placement is being sought in a dementia unit of a nursing home, parents say could go anywhere in the state  New goal(s):  Not applicable  Review of initial/current patient goals per problem list:   1.  Goal(s):  Reduce SI to appropriate level for d/c and provide safety education  Met:  Yes  Target date:  By Discharge   As evidenced by:  Denies  2.  Goal(s):  Reduce depression from 10 to 3  Met:  Yes  Target date:  By Discharge   As evidenced by:  Denies  3.  Goal(s):  Eliminate or reduce psychosis to baseline  Met:  No  Target date:  By Discharge   As evidenced by:  Remains disorganized, delusional, illogical  4.  Goal(s):  Stabilize on  medications  Met:  No  Target date:  By Discharge   As evidenced by:  May look at reducing/eliminating some of her home medications   5.  Goal(s):  Reduce confusion, i.e. Patient will know which clothes are hers  Met:  No  Target date:  By Discharge   As evidenced by:  Remains on 1:1 for redirection  6.  Goal(s):  Find appropriate placement at discharge  Met:  No  Target date:  By Discharge   As evidenced by:  FL-2 has been sent out, no responses to date   Attendees: Patient:  Vanessa Hoffman  08/04/2011  10:04 AM   Family:     Physician:  Dr. Harvie Heck Readling 08/04/2011  10:04 AM   Nursing:   Robbie Louis, RN 08/04/2011  10:04 AM   Case Manager:  Ambrose Mantle, LCSW 08/04/2011  10:04 AM   Counselor:  Veto Kemps, MT-BC 08/04/2011  10:04 AM   Other:  Vanetta Mulders, LPCA 08/04/2011  10:04 AM   Other:  Izola Price, RN 08/04/2011  10:04 AM   Other:     Other:      Scribe for Treatment Team:   Sarina Ser, 08/04/2011, 10:04 AM

## 2011-08-04 NOTE — Progress Notes (Signed)
Patient ID: Vanessa Hoffman, female   DOB: Nov 27, 1955, 55 y.o.   MRN: [id number]  See Dar at board

## 2011-08-05 LAB — T3, FREE: T3, Free: 2.1 pg/mL — ABNORMAL LOW (ref 2.3–4.2)

## 2011-08-05 LAB — TSH: TSH: 2.645 u[IU]/mL (ref 0.350–4.500)

## 2011-08-05 LAB — FOLATE RBC: RBC Folate: 543 ng/mL (ref >=366)

## 2011-08-05 LAB — VITAMIN D 25 HYDROXY (VIT D DEFICIENCY, FRACTURES): Vit D, 25-Hydroxy: 30 ng/mL (ref 30–89)

## 2011-08-05 LAB — VALPROIC ACID LEVEL: Valproic Acid Lvl: 90.7 ug/mL (ref 50.0–100.0)

## 2011-08-05 NOTE — Progress Notes (Signed)
Recreation Therapy Group Note  Date: 08/05/2011         Time: 0930      Group Topic/Focus: The focus of the group is on enhancing the patients' ability to cope with stressors by understanding what coping is, why it is important, the negative effects of stress and developing healthier coping skills. Patients practice [place] and discuss how exercise can be used as a healthy coping strategy.  Participation Level: Minimal  Participation Quality: Attentive  Affect: Labile  Cognitive: Confused   Additional Comments: Patient remains bizarre, didn't participate in the activity, but talked about "Digging a hole for the pool." Patient also observed to be waving and blowing kisses at a female peer.  Colleen Kotlarz 08/05/2011 11:33 AM

## 2011-08-05 NOTE — Progress Notes (Signed)
[facility] MD Progress Note  08/05/2011 12:50 PM  Diagnosis:  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  R/O Vascular Dementia.  R/O Acute Delirium.   The patient was seen today and reports the following:   Sleep: The patient reports to not sleeping well last night Appetite: The patient reports a good appetite.   Mild>(1-10) >Severe  Hopelessness (1-10): 8  Depression (1-10): 8  Anxiety (1-10): 5   Suicidal Ideation: The patient denies any suicidal ideations today.  Plan: No  Intent: No  Means: No   Homicidal Ideation: The patient adamantly denies any homicidal ideations today.  Plan: No  Intent: No.  Means: No   Eye Contact: Fair.  General Appearance Vanessa MurphyLequita Hoffman and remains confused.  Motor Behavior: Mildly slowed.  Speech: Mumbling.  Mental Status: Confused and oriented to self only.  States she is in [place] today. Level of Consciousness: Alert but confused.  Mood: Severely Depressed.  Affect: Flat.  Anxiety: Moderate.  Thought Process: Confused.  Thought Content: No auditory or visual hallucinations reported or observed. No delusional thinking but significantly confused. States today that she husband enjoyed "bringing things back to life." Perception: Delusional.  Judgment: Poor.  Insight: Poor.  Cognition: Orientation time, place and person.  Sleep:  Number of Hours: 4.5   Vital Signs:Blood pressure 102/72, pulse 74, temperature 97.8 F (36.6 C), temperature source Oral, resp. rate 17, weight 68.13 kg (150 lb 3.2 oz), SpO2 97.00%.  Lab Results:  Results for orders placed during the hospital encounter of 07/28/11 (from the past 48 hour(s))  GLUCOSE, CAPILLARY     Status: Abnormal   Collection Time   08/04/11  4:50 PM      Component Value Range Comment   Glucose-Capillary 110 (*) 70 - 99 (mg/dL)   CBC     Status: Abnormal   Collection Time   08/04/11  7:37 PM      Component Value Range Comment   WBC 9.4  4.0 - 10.5 (K/uL)      RBC 3.39 (*) 3.87 - 5.11 (MIL/uL)    Hemoglobin 10.7 (*) 12.0 - 15.0 (g/dL)    HCT 62.9 (*) 52.8 - 46.0 (%)    MCV 93.5  78.0 - 100.0 (fL)    MCH 31.6  26.0 - 34.0 (pg)    MCHC 33.8  30.0 - 36.0 (g/dL)    RDW 41.3  24.4 - 01.0 (%)    Platelets 192  150 - 400 (K/uL)   DIFFERENTIAL     Status: Normal   Collection Time   08/04/11  7:37 PM      Component Value Range Comment   Neutrophils Relative 59  43 - 77 (%)    Neutro Abs 5.6  1.7 - 7.7 (K/uL)    Lymphocytes Relative 26  12 - 46 (%)    Lymphs Abs 2.5  0.7 - 4.0 (K/uL)    Monocytes Relative 9  3 - 12 (%)    Monocytes Absolute 0.9  0.1 - 1.0 (K/uL)    Eosinophils Relative 5  0 - 5 (%)    Eosinophils Absolute 0.5  0.0 - 0.7 (K/uL)    Basophils Relative 0  0 - 1 (%)    Basophils Absolute 0.0  0.0 - 0.1 (K/uL)   COMPREHENSIVE METABOLIC PANEL     Status: Abnormal   Collection Time   08/04/11  7:37 PM      Component Value Range Comment   Sodium 137  135 - 145 (  mEq/L)    Potassium 3.6  3.5 - 5.1 (mEq/L)    Chloride 99  96 - 112 (mEq/L)    CO2 31  19 - 32 (mEq/L)    Glucose, Bld 132 (*) 70 - 99 (mg/dL)    BUN 30 (*) 6 - 23 (mg/dL)    Creatinine, Ser 3.47 (*) 0.50 - 1.10 (mg/dL)    Calcium 42.5  8.4 - 10.5 (mg/dL)    Total Protein 7.3  6.0 - 8.3 (g/dL)    Albumin 3.8  3.5 - 5.2 (g/dL)    AST 16  0 - 37 (U/L)    ALT 12  0 - 35 (U/L)    Alkaline Phosphatase 50  39 - 117 (U/L)    Total Bilirubin 0.1 (*) 0.3 - 1.2 (mg/dL)    GFR calc non Af Amer 46 (*) >90 (mL/min)    GFR calc Af Amer 53 (*) >90 (mL/min)   TSH     Status: Normal   Collection Time   08/04/11  7:37 PM      Component Value Range Comment   TSH 2.645  0.350 - 4.500 (uIU/mL)   T3, FREE     Status: Abnormal   Collection Time   08/04/11  7:37 PM      Component Value Range Comment   T3, Free 2.1 (*) 2.3 - 4.2 (pg/mL)   T4, FREE     Status: Normal   Collection Time   08/04/11  7:37 PM      Component Value Range Comment   Free T4 0.90  0.80 - 1.80 (ng/dL)   VITAMIN  Z56     Status: Normal   Collection Time   08/04/11  7:37 PM      Component Value Range Comment   Vitamin B-12 627  211 - 911 (pg/mL)   VITAMIN D 25 HYDROXY     Status: Normal   Collection Time   08/04/11  7:37 PM      Component Value Range Comment   Vit D, 25-Hydroxy 30  30 - 89 (ng/mL)   VALPROIC ACID LEVEL     Status: Normal   Collection Time   08/04/11  7:37 PM      Component Value Range Comment   Valproic Acid Lvl 90.7  50.0 - 100.0 (ug/mL)    CT OF HEAD: IMPRESSION: July 31, 2011  Mild chronic changes in the white matter are stable from prior  study. No acute abnormality. Negative for mass lesion.   Treatment Plan Summary:  1. Daily contact with patient to assess and evaluate symptoms and progress in treatment  2. Medication management  3. The patient will deny suicidal ideations or homicidal ideations for 48 hours prior to discharge and have a depression and anxiety rating of 3 or less. The patient will also deny any auditory or visual hallucinations or delusional thinking or display any manic or hypomanic behaviors.   Plan:  1. Will continue current medications.  2. Will discontinue the medication Klonopin in order to further simplify the patient's medications which may be contributing to a delirium. 3. Will attempt to obtain a MRI of the Head with and without contrast to further evaluate a possible dementia. 4. Continue to monitor.  Vanessa Hoffman 08/05/2011, 12:50 PM

## 2011-08-06 ENCOUNTER — Ambulatory Visit (HOSPITAL_COMMUNITY)
Admission: RE | Admit: 2011-08-06 | Discharge: 2011-08-06 | Disposition: A | Discharge status: Home / Self Care | Payer: PRIVATE HEALTH INSURANCE | Source: Ambulatory Visit | Attending: Psychiatry | Admitting: Psychiatry

## 2011-08-06 MED ORDER — RISPERIDONE 2 MG PO TABS
2.0000 mg | ORAL_TABLET | Freq: Every day | ORAL | Status: DC
  Administered 2011-08-06: 2 mg via ORAL
  Filled 2011-08-06 (×2): qty 1

## 2011-08-06 MED ORDER — GADOBENATE DIMEGLUMINE 529 MG/ML IV SOLN
15.0000 mL | Freq: Once | INTRAVENOUS | Status: AC | PRN
  Administered 2011-08-06: 15 mL via INTRAVENOUS

## 2011-08-06 MED ORDER — VITAMIN D3 25 MCG (1000 UNIT) PO TABS
1000.0000 [IU] | ORAL_TABLET | Freq: Every day | ORAL | Status: DC
  Administered 2011-08-06 – 2011-08-21 (×16): 1000 [IU] via ORAL
  Filled 2011-08-06: qty 10
  Filled 2011-08-06 (×17): qty 1

## 2011-08-06 NOTE — Progress Notes (Signed)
Pt is very confused and delusional. Pt did have her MRI done today. PT family would like to have a family session and case manager was notified of this. Pt did attend some groups. Pt was offered support and encouragement. Pt safety maintained on unit.

## 2011-08-06 NOTE — Progress Notes (Signed)
Pt is confused and disoriented  She takes her medications but was holding them in her mouth chewing them   She had to be instructed over and over to swallow the pills  Pt needs frequent redirection to not go into other pts rooms   Pt on 1:1 safe at present

## 2011-08-06 NOTE — Progress Notes (Signed)
1:1 note Pt was in the shower. Pt then visited with her parents and seemed to enjoy their company. Pt is still confused. Pt continues to be on a 1:1 for safety.

## 2011-08-06 NOTE — Progress Notes (Addendum)
Received phone message from step mother asking for a callback; stated they are upset at mixed messages they are receiving about [place].  Case Manager called, left message, explained what is being done at this time.  Ambrose Mantle, LCSW 08/06/2011, 2:06 PM  Per State Regulation 482.30  This chart was reviewed for medical necessity with respect to the patient's Admission/Duration of stay.   Next review due:  08/09/11   Ambrose Mantle, LCSW  08/06/2011  5:26 PM

## 2011-08-06 NOTE — Progress Notes (Signed)
[facility] Group Notes:  (Counselor/Nursing/MHT/Case Management/Adjunct)  08/06/2011 2:32 PM  Type of Therapy:  Group Therapy  Participation Level:  Minimal  Participation Quality:  Inattentive    Affect:  Angry   Cognitive:  Confused  Insight:  Limited  Engagement in Group:  Limited  Engagement in Therapy:  Limited  Modes of Intervention:  Activity, Clarification, Education, Limit-setting, Orientation, Problem-solving, Socialization and Support  Summary of Progress/Problems:  Pt attended group therapy focused on identifying problems.  Pt stated she did not have anything to talk about.  Pt was fixated on a piece of notebook paper and did not engage in the process.  No progress noted.    Marni Griffon C 08/06/2011, 2:32 PM

## 2011-08-06 NOTE — Progress Notes (Signed)
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  R/O Vascular Dementia.  R/O Acute Delirium.   The patient was seen today and reports the following:   Sleep: The patient reports to sleeping better last night. Appetite: The patient reports a good appetite.   Mild>(1-10) >Severe  Hopelessness (1-10): 8  Depression (1-10): 8  Anxiety (1-10): 5   Suicidal Ideation: The patient denies any suicidal ideations today.  Plan: No  Intent: No  Means: No   Homicidal Ideation: The patient denies any homicidal ideations today.  Plan: No  Intent: No.  Means: No   Eye Contact: Fair.  General Appearance Vanessa Hoffman and remains confused.  Motor Behavior: Mildly slowed.  Speech: Mumbling.  Mental Status: Confused and oriented to self only.   Level of Consciousness: Alert but confused.  Mood: Severely Depressed.  Affect: Flat.  Anxiety: Moderate.  Thought Process: Confused.  Thought Content: No auditory or visual hallucinations reported or observed. No delusional thinking but significantly confused.  Perception: Very Confused.  Judgment: Poor.  Insight: Poor.  Cognition: Orientation time, place and person.  Sleep:  Number of Hours: 6.25   Vital Signs:Blood pressure 101/70, pulse 72, temperature 97.7 F (36.5 C), temperature source Oral, resp. rate 18, weight 68.13 kg (150 lb 3.2 oz), SpO2 97.00%.  Lab Results:  Results for orders placed during the hospital encounter of 07/28/11 (from the past 48 hour(s))  GLUCOSE, CAPILLARY     Status: Abnormal   Collection Time   08/04/11  4:50 PM      Component Value Range Comment   Glucose-Capillary 110 (*) 70 - 99 (mg/dL)   CBC     Status: Abnormal   Collection Time   08/04/11  7:37 PM      Component Value Range Comment   WBC 9.4  4.0 - 10.5 (K/uL)    RBC 3.39 (*) 3.87 - 5.11 (MIL/uL)    Hemoglobin 10.7 (*) 12.0 - 15.0 (g/dL)    HCT 45.4 (*) 09.8 - 46.0 (%)    MCV  93.5  78.0 - 100.0 (fL)    MCH 31.6  26.0 - 34.0 (pg)    MCHC 33.8  30.0 - 36.0 (g/dL)    RDW 11.9  14.7 - 82.9 (%)    Platelets 192  150 - 400 (K/uL)   DIFFERENTIAL     Status: Normal   Collection Time   08/04/11  7:37 PM      Component Value Range Comment   Neutrophils Relative 59  43 - 77 (%)    Neutro Abs 5.6  1.7 - 7.7 (K/uL)    Lymphocytes Relative 26  12 - 46 (%)    Lymphs Abs 2.5  0.7 - 4.0 (K/uL)    Monocytes Relative 9  3 - 12 (%)    Monocytes Absolute 0.9  0.1 - 1.0 (K/uL)    Eosinophils Relative 5  0 - 5 (%)    Eosinophils Absolute 0.5  0.0 - 0.7 (K/uL)    Basophils Relative 0  0 - 1 (%)    Basophils Absolute 0.0  0.0 - 0.1 (K/uL)   COMPREHENSIVE METABOLIC PANEL     Status: Abnormal   Collection Time   08/04/11  7:37 PM      Component Value Range Comment   Sodium 137  135 - 145 (mEq/L)    Potassium 3.6  3.5 - 5.1 (mEq/L)    Chloride 99  96 -  112 (mEq/L)    CO2 31  19 - 32 (mEq/L)    Glucose, Bld 132 (*) 70 - 99 (mg/dL)    BUN 30 (*) 6 - 23 (mg/dL)    Creatinine, Ser 1.61 (*) 0.50 - 1.10 (mg/dL)    Calcium 09.6  8.4 - 10.5 (mg/dL)    Total Protein 7.3  6.0 - 8.3 (g/dL)    Albumin 3.8  3.5 - 5.2 (g/dL)    AST 16  0 - 37 (U/L)    ALT 12  0 - 35 (U/L)    Alkaline Phosphatase 50  39 - 117 (U/L)    Total Bilirubin 0.1 (*) 0.3 - 1.2 (mg/dL)    GFR calc non Af Amer 46 (*) >90 (mL/min)    GFR calc Af Amer 53 (*) >90 (mL/min)   TSH     Status: Normal   Collection Time   08/04/11  7:37 PM      Component Value Range Comment   TSH 2.645  0.350 - 4.500 (uIU/mL)   T3, FREE     Status: Abnormal   Collection Time   08/04/11  7:37 PM      Component Value Range Comment   T3, Free 2.1 (*) 2.3 - 4.2 (pg/mL)   T4, FREE     Status: Normal   Collection Time   08/04/11  7:37 PM      Component Value Range Comment   Free T4 0.90  0.80 - 1.80 (ng/dL)   VITAMIN E45     Status: Normal   Collection Time   08/04/11  7:37 PM      Component Value Range Comment   Vitamin B-12 627   211 - 911 (pg/mL)   FOLATE RBC     Status: Normal   Collection Time   08/04/11  7:37 PM      Component Value Range Comment   RBC Folate 543  >=366 (ng/mL) Reference range not established for pediatric patients.  VITAMIN D 25 HYDROXY     Status: Normal   Collection Time   08/04/11  7:37 PM      Component Value Range Comment   Vit D, 25-Hydroxy 30  30 - 89 (ng/mL)   VALPROIC ACID LEVEL     Status: Normal   Collection Time   08/04/11  7:37 PM      Component Value Range Comment   Valproic Acid Lvl 90.7  50.0 - 100.0 (ug/mL)    CT OF HEAD: IMPRESSION: July 31, 2011  Mild chronic changes in the white matter are stable from prior  study. No acute abnormality. Negative for mass lesion.     . cholecalciferol  1,000 Units Oral Daily  . divalproex  1,000 mg Oral QHS  . donepezil  5 mg Oral QHS  . hydrochlorothiazide  12.5 mg Oral Daily  . multivitamins with iron  1 tablet Oral Daily  . nitrofurantoin  50 mg Oral Q6H  . olmesartan  20 mg Oral Daily  . propranolol  20 mg Oral BH-qamhs  . risperiDONE  4 mg Oral QHS  . sertraline  200 mg Oral Daily  . simvastatin  10 mg Oral QHS  . traZODone  100 mg Oral QHS  . DISCONTD: clonazePAM  0.5 mg Oral BH-qamhs   Treatment Plan Summary:  1. Daily contact with patient to assess and evaluate symptoms and progress in treatment  2. Medication management  3. The patient will deny suicidal ideations or homicidal ideations for 48 hours  prior to discharge and have a depression and anxiety rating of 3 or less. The patient will also deny any auditory or visual hallucinations or delusional thinking or display any manic or hypomanic behaviors.   Plan:  1. Will continue current medications.  2. Will decrease Risperdal to 2 mgs po qhs to further address any possible medications which could lead to a delirium. 3. Will attempt to obtain a MRI of the Head with and without contrast to further evaluate a possible dementia today.  4. Will add Vitamin D 1000  Units po q am to address low Vitamin D level. 5. Continue to monitor.   Vanessa Hoffman 08/06/2011, 11:30 AM

## 2011-08-06 NOTE — Progress Notes (Signed)
Patient ID: Vanessa Hoffman, female   DOB: 1956/05/12, 55 y.o.   MRN: [id number]  Stated during group that the visit with her mom today wasn't good. They talked about [name] (her ex husband).  Stated her mom doesn't like him. Support and encouragement was offered.

## 2011-08-06 NOTE — Progress Notes (Signed)
1:1 note Pt is back from her MRI. Pt is still confused and needs a lot of redirection. Pt was offered support and encouragement. Pt continues to be on a 1:1 for safety.

## 2011-08-06 NOTE — Discharge Planning (Signed)
Met with patient in Aftercare Planning Group.   She was upset with Mental Health Technician when she tried to leave the group and was informed she would have to then go to her room, and could not stay in the hall.  While in group, she was asked to hold a cup of water for another patient for a moment, and started to poor it out into her lap.  Was quickly redirected by her 1:1.  Expressed no case management needs today.  [employer] and [employer] is interested in hearing more about patient's needs, and Case Manager will contact them after MRI results are in and more exact information can be given to them.  Ambrose Mantle, LCSW 08/06/2011, 10:02 AM

## 2011-08-07 LAB — VITAMIN D 1,25 DIHYDROXY
Vitamin D 1, 25 (OH)2 Total: 16 pg/mL — ABNORMAL LOW (ref 18–72)
Vitamin D2 1, 25 (OH)2: <8 pg/mL

## 2011-08-07 MED ORDER — RISPERIDONE 1 MG PO TABS
1.0000 mg | ORAL_TABLET | Freq: Every day | ORAL | Status: DC
  Administered 2011-08-07: 1 mg via ORAL
  Filled 2011-08-07 (×3): qty 1

## 2011-08-07 NOTE — Progress Notes (Signed)
Pt has been resting in her bed without complaint  She has dozed little but has mostly been awake since 0200 am   Pt on 1:1 safe at present time

## 2011-08-07 NOTE — Progress Notes (Signed)
Pt goes to group. Pt continues to be confused but at times does seem to be able to understand what's going on around her and talk logically. Pt was offered support and encouragement. Pt is on a 1:1 For safety.

## 2011-08-07 NOTE — Progress Notes (Signed)
[facility] Group Notes:  (Counselor/Nursing/MHT/Case Management/Adjunct)  08/07/2011 12:46 PM  Type of Therapy:  Group Therapy  Participation Level:  Did Not Attend   Vanessa Hoffman 08/07/2011, 12:46 PM

## 2011-08-07 NOTE — Discharge Planning (Signed)
Met with patient's stepmother yesterday evening when she and father came to visit patient at suppertime.  Explained why she had a CT scan previously and now has had an MRI.  Talked at length about process of finding her placement.  They are very involved and care a great deal, but stated once again, "If you tell us you are sending her home, we'll pack up and move.  She can't come back."  They would like to come to treatment team at some point.  During Aftercare Planning Group today, Case Manager provided psychoeducation on "Suicide Prevention Information."  This included descriptions of risk factors for suicide, warning signs that an individual is in crisis and thinking of suicide, and what to do if this occurs.  Pt indicated understanding of information provided, and will read brochure given upon discharge.     Ambrose Mantle, LCSW 08/07/2011, 12:44 PM

## 2011-08-07 NOTE — Progress Notes (Signed)
1:1 Pt is lying in bed. Pt is saying her parents are in the hallway. Pt continues to be disorganized and needs redirection.Pt continues to be on a 1:1 for safety.

## 2011-08-07 NOTE — Progress Notes (Signed)
Pt is awake at present time   She is confused and talking incoherently   Pt on 1:1  Safe at present

## 2011-08-07 NOTE — Progress Notes (Signed)
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  R/O Vascular Dementia.  R/O Acute Delirium.   The patient was seen today and reports the following:   Sleep: The patient reports to sleeping better last night but according to staff, she slept less than 3 hours. Appetite: The patient reports a good appetite.   Mild>(1-10) >Severe  Hopelessness (1-10): 8  Depression (1-10): 7  Anxiety (1-10): 3   Suicidal Ideation: The patient denies any suicidal ideations today.  Plan: No  Intent: No  Means: No   Homicidal Ideation: The patient denies any homicidal ideations today.  Plan: No  Intent: No.  Means: No   Eye Contact: Fair.  General Appearance Vanessa Hoffman and remains confused.  Motor Behavior: Mildly slowed.  Speech: Mumbling.  Mental Status: Confused and oriented to self only.  Level of Consciousness: Alert but confused.  Mood: Moderately Depressed.  Affect: Flat.  Anxiety: Mild.  Thought Process: Confused.  Thought Content: No auditory or visual hallucinations reported or observed. No delusional thinking but significantly confused.  Perception: Very Confused.  Judgment: Poor.  Insight: Poor.  Cognition: Orientation time, place and person.  Sleep:  Number of Hours: 2.25   Vital Signs:Blood pressure 92/63, pulse 85, temperature 98.1 F (36.7 C), temperature source Oral, resp. rate 18, weight 68.13 kg (150 lb 3.2 oz), SpO2 97.00%.  Lab Results: No results found for this or any previous visit (from the past 48 hour(s)).  The patient was lucid at times but would then state her sister gave birth to "17 babies last night."  CT OF HEAD: IMPRESSION: July 31, 2011  Mild chronic changes in the white matter are stable from prior  study. No acute abnormality. Negative for mass lesion.   MRI RESULTS:  (August 06, 2011)  IMPRESSION:  1. No acute intracranial abnormality.  2. Age advanced but  nonspecific cerebral white matter signal  changes. Differential considerations include  accelerated/hereditary small vessel ischemia, sequelae of trauma,  hypercoagulable state, vasculitis, migraines, prior infection or  demyelination.  3. Generalized cerebral volume loss which may be mildly advanced  for age. No disproportionate areas of atrophy are identified.  4. Mild paranasal sinus and right mastoid inflammatory changes.  Imaging Studies continue to be non-specific and suggestive but not diagnostic of a dementing illness.   Treatment Plan Summary:  1. Daily contact with patient to assess and evaluate symptoms and progress in treatment  2. Medication management  3. The patient will deny suicidal ideations or homicidal ideations for 48 hours prior to discharge and have a depression and anxiety rating of 3 or less. The patient will also deny any auditory or visual hallucinations or delusional thinking or display any manic or hypomanic behaviors.   Plan:  1. Will continue current medications.  2. Will order a Sedimentation Rate to screen for vasculitis as well as a INR to screen for hypercoagulable state. 3. Will remain on 1:1 for fall risk and confusion. 4. Continue to monitor.  Elliyah Liszewski 08/07/2011, 7:25 PM

## 2011-08-07 NOTE — Progress Notes (Signed)
1:1 Pt is just walking around room. Pt was able to wash her clothes. Pt continues to need redirection. Pt continues to be on a 1:1 for safety.

## 2011-08-07 NOTE — Tx Team (Signed)
Interdisciplinary Treatment Plan Update (Adult)  Date:  08/07/2011  Time Reviewed:  9:39 AM   Progress in Treatment: Attending groups:  Yes Participating in groups:    Yes Taking medication as prescribed:    Yes Tolerating medication:   Yes Family/Significant other contact made:  Yes, met with stepmother yesterday evening at length Patient understands diagnosis:   No Discussing patient identified problems/goals with staff:   Yes Medical problems stabilized or resolved:   Yes Denies suicidal/homicidal ideation:  Yes Issues/concerns per patient self-inventory:   None Other:  New problem(s) identified: No, Describe:    Reason for Continuation of Hospitalization: Delusions  Hallucinations Medication stabilization Other; describe determine whether patient has dementia  Interventions implemented related to continuation of hospitalization:  Medication monitoring and adjustment, safety checks Q15 min., group therapy, psychoeducation, collateral contact  Additional comments:  Not applicable  Estimated length of stay:  4-5 days  Discharge Plan:  Placement is being sought in a dementia unit of a nursing home, parents say could go anywhere in the state  New goal(s):  Not applicable  Review of initial/current patient goals per problem list:   1.  Goal(s):  Reduce SI to appropriate level for D/C and provide safety education  Met:  Yes  Target date:  By Discharge   As evidenced by:  Patient has received SI education and denies SI  2.  Goal(s):  Reduce depression from 10 to 3.  Met:  No  Target date:  By Discharge   As evidenced by:  Depression remains high  3.  Goal(s):  Eliminate or reduce psychosis to baseline.  Met:  No  Target date:  By Discharge   As evidenced by:  "My parents got married yesterday and my daughter had 17 babies yesterday."  4.  Goal(s):  Stabilize on medications  Met:  No  Target date:  By Discharge   As evidenced by:  Still in process  4.   Goal(s):  Reduce confusion, i.e. Patient will know which clothes are hers  Met:  No  Target date:  By Discharge   As evidenced by:  Remains on 1:1 for confusion  4.  Goal(s): Find appropriate placement for discharge  Met: No  Target date:  By Discharge   As evidenced by:  Still needed  Attendees: Patient:  Did not attend 08/07/2011  9:39 AM   Family:     Physician:  Dr. Harvie Heck Readling 08/07/2011  9:39 AM   Nursing:   Omelia Blackwater, RN, BSN 08/07/2011  9:39 AM   Case Manager:  Ambrose Mantle, LCSW 08/07/2011  9:39 AM   Counselor:  Veto Kemps, MT-BC 08/07/2011  9:39 AM   Other:   Alease Frame, RN, MSN, [facility] 08/07/2011  9:39 AM  Other:     Other:     Other:      Scribe for Treatment Team:   Sarina Ser, 08/07/2011, 9:39 AM

## 2011-08-08 LAB — PROTIME-INR: Prothrombin Time: 13 seconds (ref 11.6–15.2)

## 2011-08-08 MED ORDER — ASPIRIN EC 81 MG PO TBEC
81.0000 mg | DELAYED_RELEASE_TABLET | Freq: Every day | ORAL | Status: DC
  Administered 2011-08-08 – 2011-08-21 (×14): 81 mg via ORAL
  Filled 2011-08-08 (×4): qty 1
  Filled 2011-08-08: qty 10
  Filled 2011-08-08 (×11): qty 1

## 2011-08-08 NOTE — Progress Notes (Signed)
Patient ID: Vanessa Hoffman, female   DOB: 02/17/1956, 55 y.o.   MRN: [id number] The patient remains disorganized and disoriented to time and place. She mostly rambles without completing a sentences or thought. During a brief moment of clarity stated, " I have Alzheimer's. I use to know words, but now I can't remember them. It's sad."  After a brief pause, went back to rambling incoherently.

## 2011-08-08 NOTE — Progress Notes (Signed)
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  R/O Vascular Dementia.  R/O Acute Delirium.   The patient was seen today and reports the following:   Sleep: The patient reports to sleeping well last night but according to staff, she slept only 2 hours.  Appetite: The patient reports a good appetite.   Mild>(1-10) >Severe  Hopelessness (1-10): 6  Depression (1-10): 6  Anxiety (1-10): 3   Suicidal Ideation: The patient denies any suicidal ideations today.  Plan: No  Intent: No  Means: No   Homicidal Ideation: The patient denies any homicidal ideations today.  Plan: No  Intent: No.  Means: No   Eye Contact: Fair.  General Appearance Vanessa MurphyLequita Hoffman and remains confused.  Motor Behavior: Mildly slowed.  Speech: Mumbling but at times can speak clearly.  Mental Status: Confused and oriented to self only.  Level of Consciousness: Alert but confused.  Mood: Moderately Depressed.  Affect: Moderately Constricted.  Anxiety: Mild.  Thought Process: Confused.  Thought Content: No auditory or visual hallucinations reported or observed. No delusional thinking but significantly confused.  Perception: Very Confused.  Judgment: Poor.  Insight: Poor.  Cognition: Orientation time, place and person.  Sleep:  Number of Hours: 2   Vital Signs:Blood pressure 144/97, pulse 77, temperature 97.2 F (36.2 C), temperature source Oral, resp. rate 16, weight 68.13 kg (150 lb 3.2 oz), SpO2 97.00%.  Lab Results:  Results for orders placed during the hospital encounter of 07/28/11 (from the past 48 hour(s))  SEDIMENTATION RATE     Status: Abnormal   Collection Time   08/08/11  6:53 AM      Component Value Range Comment   Sed Rate 25 (*) 0 - 22 (mm/hr)   PROTIME-INR     Status: Normal   Collection Time   08/08/11  6:53 AM      Component Value Range Comment   Prothrombin Time 13.0  11.6 - 15.2 (seconds)    INR 0.96  0.00 -  1.49     CT OF HEAD: IMPRESSION: July 31, 2011  Mild chronic changes in the white matter are stable from prior  study. No acute abnormality. Negative for mass lesion.   MRI RESULTS: (August 06, 2011)  IMPRESSION:  1. No acute intracranial abnormality.  2. Age advanced but nonspecific cerebral white matter signal  changes. Differential considerations include  accelerated/hereditary small vessel ischemia, sequelae of trauma,  hypercoagulable state, vasculitis, migraines, prior infection or  demyelination.  3. Generalized cerebral volume loss which may be mildly advanced  for age. No disproportionate areas of atrophy are identified.  4. Mild paranasal sinus and right mastoid inflammatory changes.   Imaging Studies continue to be non-specific and suggestive but not diagnostic of a dementing illness.   Treatment Plan Summary:  1. Daily contact with patient to assess and evaluate symptoms and progress in treatment  2. Medication management  3. The patient will deny suicidal ideations or homicidal ideations for 48 hours prior to discharge and have a depression and anxiety rating of 3 or less. The patient will also deny any auditory or visual hallucinations or delusional thinking or display any manic or hypomanic behaviors.   Plan:  1. Will continue current medications.  2. Will start Aspirin EC 81 mgs po q am to provide blood thinning and as an anti-inflammatory in the event that either hypercoagulable state or vasculitis is occurring. 3. Will remain on 1:1 for fall  risk and confusion.  4. Will discontinue Risperdal today with plans to possibly add Seroquel in the future for sleep and mood stabilization. 5. Continue to monitor.  Teddy Rebstock 08/08/2011, 1:26 PM

## 2011-08-08 NOTE — Progress Notes (Signed)
Patient ID: Vanessa Hoffman, female   DOB: 1955-10-03, 55 y.o.   MRN: [id number] The patient is resting in bed with eyes closed. No distress noted. She was awake most of the evening and only slept about 2 hours. 1:1 maintained for safety.

## 2011-08-08 NOTE — Progress Notes (Signed)
Late entry for 1400   Pt remains confused and has been irritable with other pts   She needs frequent redirection   Pt on 1:1  Safe at present

## 2011-08-08 NOTE — Progress Notes (Signed)
Pt is confused and needs frequent redirection   She has difficulty swallowing her medications so she needs close monitoring and lots of water  Her thinking is illogical and is disorganized and tangential   She is pleasant upon interaction   It was reported by night shift that the pt is having much difficulty sleeping   Pt is on 1:1 and is safe at present time

## 2011-08-08 NOTE — Progress Notes (Signed)
Patient ID: Vanessa Hoffman, female   DOB: 01/22/1956, 55 y.o.   MRN: [id number] The patient remains confused and disoriented to time and place. She wanders in her room and in the hallway. Randomly will pick up something and identify it as something that belongs to her even though staff has told her it belongs to someone else. Compliant with medication. Unable to attend evening group as her thoughts and speech are too disorganized to participate. 1:1 maintained for safety.

## 2011-08-08 NOTE — Progress Notes (Signed)
[facility] Group Notes:  (Counselor/Nursing/MHT/Case Management/Adjunct)  08/08/2011 11 AM  Type of Therapy:  Group Therapy, Dance/Movement Therapy   Participation Level:  Minimal  Participation Quality:  Supportive  Affect:  Depressed  Cognitive:  Confused and Hallucinating  Insight:  Limited  Engagement in Group:  Limited  Engagement in Therapy:  Limited  Modes of Intervention:  Clarification, Problem-solving, Role-play, Socialization and Support  Summary of Progress/Problems:  pt spoke about feeling "like a star" today.  Pt  practiced muscle relaxation by getting tension in their hands and shoulders and releasing the tension and taking a deep breath. Pt spoke to other people in the group that therapist could not see but smiled and seemed happy.    Vanessa Hoffman

## 2011-08-08 NOTE — Progress Notes (Signed)
Pt is basically the same as the previous assessments   She is confused needs frequent redirection   She can be intrusive at times   Pt on 1:1  Safe at present

## 2011-08-08 NOTE — Progress Notes (Signed)
[facility] Group Notes:  (Counselor/Nursing/MHT/Case Management/Adjunct)  08/08/2011 11:20 AM  Type of Therapy:  Counseling  Pt. Attended after care planning group and was given [place] Suicide Prevention information and crisis hot line numbers. Pt. agreed to use the numbers if needed.  Pt. Spoke about being in pain due to the damp cloudy weatherType of Therapy.  Pt. Attended after care planning group and was given [place] Suicide Prevention information and crisis hot line numbers. Pt. agreed to use the numbers if needed. r today. Pt. Also reported she was having some problems with her vision.  Lamar Blinks [place] 08/08/2011, 11:20 AM

## 2011-08-08 NOTE — Progress Notes (Signed)
Returned call form Vanessa Hoffman pt's step mother after confirming that pt had given consent to talk to her. Vanessa Hoffman requested information on the pt and was told she is still a bit confused and is talking to "molly" who is not there. Vanessa Hoffman requested information on the results of the pt's MRI and was referred to talk to the pt.'s nurse for these results.

## 2011-08-09 DIAGNOSIS — F411 Generalized anxiety disorder: Secondary | ICD-10-CM

## 2011-08-09 DIAGNOSIS — F333 Major depressive disorder, recurrent, severe with psychotic symptoms: Principal | ICD-10-CM

## 2011-08-09 NOTE — Progress Notes (Signed)
Late entry for 1:1 note/assessment Pt is a little more spontaneous but continues to have disorganized thoughts and some confusion   Pt on 1:1 safe at present

## 2011-08-09 NOTE — Progress Notes (Signed)
Pt continues with intermittant confusion with moments of lucidity   She is calmer this morning and did manage to get a few hours sleep   Her interactions are minimal and she is intrusive   Pt on 1:1   Verbal support given  Medications administered and effectivenesss monitored   Pt safe at present

## 2011-08-09 NOTE — Progress Notes (Signed)
Pt continues to be intermittantly confused   She is pleasant and cooperative  She needs frequent redirection as she is very intrusive   Pt on 1:1  Safe at present

## 2011-08-09 NOTE — Progress Notes (Signed)
Patient ID: Vanessa Hoffman, female   DOB: [birth year], 55 y.o.   MRN: [id number]   [facility] Group Notes:  (Counselor/Nursing/MHT/Case Management/Adjunct)  08/09/2011 11 AM  Type of Therapy:  Group Therapy, Dance/Movement Therapy   Participation Level:  Did Not Attend  Kym Groom

## 2011-08-09 NOTE — Progress Notes (Signed)
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  R/O Vascular Dementia.  R/O Acute Delirium.   The patient was seen today and reports the following:   Sleep: The patient reports to sleeping well last night but according to staff, she slept only 1 hour.  Appetite: The patient reports a good appetite.   Mild>(1-10) >Severe  Hopelessness (1-10): 3  Depression (1-10): 0  Anxiety (1-10): 0   Suicidal Ideation: The patient denies any suicidal ideations today.  Plan: No  Intent: No  Means: No   Homicidal Ideation: The patient denies any homicidal ideations today.  Plan: No  Intent: No.  Means: No   Eye Contact: Fair.  General Appearance Vanessa Hoffman: Need today but remains confused.  Motor Behavior: Mildly slowed.  Speech: Mumbling but at times can speak clearly.  Mental Status: Confused and oriented to self only.  Level of Consciousness: Alert but confused.  Mood: No significant depression noted.  Affect: Moderately Constricted.  Anxiety: None noted or observed.  Thought Process: Confused.  Thought Content: No auditory or visual hallucinations reported or observed. No delusional thinking but significantly confused.  Perception: Very Confused.  Judgment: Poor.  Insight: Poor.  Cognition: Orientation time, place and person.  Sleep:  Number of Hours: 1   Vital Signs:Blood pressure 123/78, pulse 74, temperature 97.7 F (36.5 C), temperature source Oral, resp. rate 20, weight 68.13 kg (150 lb 3.2 oz), SpO2 97.00%.  Lab Results:  Results for orders placed during the hospital encounter of 07/28/11 (from the past 48 hour(s))  SEDIMENTATION RATE     Status: Abnormal   Collection Time   08/08/11  6:53 AM      Component Value Range Comment   Sed Rate 25 (*) 0 - 22 (mm/hr)   PROTIME-INR     Status: Normal   Collection Time   08/08/11  6:53 AM      Component Value Range Comment   Prothrombin Time 13.0  11.6 - 15.2  (seconds)    INR 0.96  0.00 - 1.49     CT OF HEAD: IMPRESSION: July 31, 2011  Mild chronic changes in the white matter are stable from prior  study. No acute abnormality. Negative for mass lesion.   MRI RESULTS: (August 06, 2011)  IMPRESSION:  1. No acute intracranial abnormality.  2. Age advanced but nonspecific cerebral white matter signal  changes. Differential considerations include  accelerated/hereditary small vessel ischemia, sequelae of trauma,  hypercoagulable state, vasculitis, migraines, prior infection or  demyelination.  3. Generalized cerebral volume loss which may be mildly advanced  for age. No disproportionate areas of atrophy are identified.  4. Mild paranasal sinus and right mastoid inflammatory changes.  Imaging Studies continue to be non-specific and suggestive but not diagnostic of a dementing illness.   Treatment Plan Summary:  1. Daily contact with patient to assess and evaluate symptoms and progress in treatment  2. Medication management  3. The patient will deny suicidal ideations or homicidal ideations for 48 hours prior to discharge and have a depression and anxiety rating of 3 or less. The patient will also deny any auditory or visual hallucinations or delusional thinking or display any manic or hypomanic behaviors.   Plan:  1. Will continue current medications.  2. Will remain on 1:1 for fall risk and confusion.  3. Will order a Neurology Consult on Monday for a second opinion concerning dementia vs delirium.  4. Continue to  monitor.  Klare Criss 08/09/2011, 12:28 PM

## 2011-08-10 NOTE — Discharge Planning (Addendum)
Met with patient in Aftercare Planning Group.   She is aware that we are seeking placement for her, and is content to await that.   Case Manager needs additional information about what type of placement to seek, whether this needs to be SNF versus ALF, based on results of MRI.  Also, patient's stepmother is calling requesting results of MRI.  Ambrose Mantle, LCSW 08/10/2011, 9:39 AM  Per State Regulation 482.30  This chart was reviewed for medical necessity with respect to the patient's Admission/Duration of stay.   Next review due:  12.20.12   Ambrose Mantle, LCSW  08/10/2011  12:36 PM    At request of stepmother, faxed copy of FL-2 to [facility] for assistance with placement.  Ambrose Mantle, LCSW 08/10/2011, 12:37 PM

## 2011-08-10 NOTE — Progress Notes (Signed)
[facility] MD Progress Note  08/10/2011 1:49 PM  Patient was seen during rounds this am with sitter for 1:1 supervision present. Patient reports "I feel agitated because I can't go out. I am craving cocaine right now. I am my own greatest enemy"  Diagnosis:   Axis I: Major Depression, Recurrent severe, with psychotic features. Axis II: Deferred Axis III:  Past Medical History  Diagnosis Date  . Anxiety   . Arthritis   . Depression   . Hypertension   . Fibromyalgia   . DDD (degenerative disc disease)   . Osteoporosis    Axis IV: No changes Axis V: 21-30 behavior considerably influenced by delusions or hallucinations OR serious impairment in judgment, communication OR inability to function in almost all areas  ADL's:  Impaired, has to be reminded and monitored as well.  Sleep:  4.75 hours Appetite:  Fair  Suicidal Ideation:   Plan:  No  Intent:  No  Means:  No  Homicidal Ideation:   Plan:  Yes  Intent:  No  Means:  No   Mental Status: General Appearance /Behavior:  Patient is still in her night clothes. Eye Contact:  Good, improved from last week. Motor Behavior:  Fairly good. Speech:  Clear Level of Consciousness:  Alert Mood:  Euthymic Affect:  Appropriate Anxiety Level:  Minimal per observation. Thought Process:  Loose, at times Thought Content:  Paranoid Ideation Perception:  Hallucinations, "I see people and furnitures" Judgment:  Poor Insight:  Absent Cognition:  Oriented to person. Sleep:  Number of Hours: 4.75   Vital Signs:Blood pressure 113/77, pulse 67, temperature 98.2 F (36.8 C), temperature source Oral, resp. rate 20, weight 150 lb 3.2 oz (68.13 kg), SpO2 97.00%.  Lab Results: No results found for this or any previous visit (from the past 48 hour(s)).  Treatment Plan Summary: Daily contact with patient to assess and evaluate symptoms and progress in treatment Medication management  Plan: Continue patient on current treatment plan.  Continue 1:1 supervision to maintain safety.            Order neuro consult.         [name] 08/10/2011, 1:49 PM

## 2011-08-10 NOTE — Progress Notes (Signed)
Patient ID: Vanessa Hoffman, female   DOB: 1956-01-30, 55 y.o.   MRN: [id number] The patient remains disorganized and confused. She is compliant with medication but sometimes has difficulty swallowing her Depakote. 1:1 maintained for safety.

## 2011-08-10 NOTE — Progress Notes (Signed)
Patient ID: Vanessa Hoffman, female   DOB: [birth year], 55 y.o.   MRN: [id number] The patient is resting in bed with her eyes closed. No distress noted. 1:1 maintained for safety. Will continue to monitor for safety.

## 2011-08-10 NOTE — Consult Note (Signed)
Reason for Consult: Mental status changes Referring Physician: Readling  CC: Difficulty focusing  HPI: Vanessa Hoffman is an 55 y.o. female who presented with complaints of drinking again. Has had hallucinations and suicidal ideation.  Has also had difficulty concentrating and impaired memory.  MRI of the brain has been performed within the month and shows nonspecific white matter changes consistent with small vessel ischemic changes.  Consult called for further recommendations.    Past Medical History  Diagnosis Date  . Anxiety   . Arthritis   . Depression   . Hypertension   . Fibromyalgia   . DDD (degenerative disc disease)   . Osteoporosis     Past Surgical History  Procedure Date  . Cervical disc surgery 2001    3 fusions  . Cesarean section     2    History reviewed. No pertinent family history.  Social History:  reports that she has been smoking Cigarettes.  She has a 5 pack-year smoking history. She has never used smokeless tobacco. She reports that she drinks about 1.2 ounces of alcohol per week. She reports that she does not use illicit drugs.  No Known Allergies  Medications:  I have reviewed the patient's current medications. Scheduled:   . aspirin EC  81 mg Oral Daily  . cholecalciferol  1,000 Units Oral Daily  . divalproex  1,000 mg Oral QHS  . donepezil  5 mg Oral QHS  . hydrochlorothiazide  12.5 mg Oral Daily  . multivitamins with iron  1 tablet Oral Daily  . olmesartan  20 mg Oral Daily  . propranolol  20 mg Oral BH-qamhs  . sertraline  200 mg Oral Daily  . simvastatin  10 mg Oral QHS  . traZODone  100 mg Oral QHS    ROS: History obtained from the patient  General ROS: negative for - chills, fatigue, fever, night sweats, weight gain or weight loss Psychological ROS: as noted in HPI Ophthalmic ROS: negative for - blurry vision, double vision, eye pain or loss of vision ENT ROS: negative for - epistaxis, nasal discharge, oral lesions, sore  throat, tinnitus or vertigo Allergy and Immunology ROS: negative for - hives or itchy/watery eyes Hematological and Lymphatic ROS: negative for - bleeding problems, bruising or swollen lymph nodes Endocrine ROS: negative for - galactorrhea, hair pattern changes, polydipsia/polyuria or temperature intolerance Respiratory ROS: negative for - cough, hemoptysis, shortness of breath or wheezing Cardiovascular ROS: negative for - chest pain, dyspnea on exertion, edema or irregular heartbeat Gastrointestinal ROS: negative for - abdominal pain, diarrhea, hematemesis, nausea/vomiting or stool incontinence Genito-Urinary ROS: negative for - dysuria, hematuria, incontinence or urinary frequency/urgency Musculoskeletal ROS: negative for - joint swelling or muscular weakness Neurological ROS: as noted in HPI Dermatological ROS: negative for rash and skin lesion changes  Blood pressure 113/77, pulse 67, temperature 98.2 F (36.8 C), temperature source Oral, resp. rate 20, weight 68.13 kg (150 lb 3.2 oz), SpO2 97.00%.  Neurologic Examination: Mental Status: Alert.  Reports initially that it is 1911 then corrects to 2011.  Knows month.  Not clear about the day of the week.  Knows the Presidents back through [place] but gets some out of order.   Speech fluent without evidence of aphasia.  Able to follow 3 step commands without difficulty. Cranial Nerves: II: visual fields grossly normal, pupils equal, round, reactive to light and accommodation III,IV, VI: ptosis not present, extra-ocular motions intact bilaterally V,VII: smile symmetric, facial light touch sensation normal bilaterally VIII: hearing  normal bilaterally IX,X: gag reflex present XI: trapezius strength/neck flexion strength normal bilaterally XII: tongue strength normal  Motor: Right : Upper extremity   5/5    Left:     Upper extremity   5/5  Lower extremity   5/5     Lower extremity   5/5 Tone and bulk:normal tone throughout; no atrophy  noted Sensory: Pinprick and light touch intact throughout, bilaterally Deep Tendon Reflexes: 2+ and symmetric throughout Plantars: Right: downgoing   Left: downgoing Cerebellar: normal finger-to-nose and normal heel-to-shin test normal gait and station  Assessment/Plan:  Patient Active Hospital Problem List: Mental status change   Assessment: Question of vascular dementia but difficult to diagnose with patient's concurrent psychiatric illness.  Exam and MRI not diagnostic.  Further work up recommended.   Plan: 1. EEG            2. B12, RPR to be drawn. Recent TSH and ESR are unremarkable.  Vit D level to be addressed.    Thana Farr, MD [facility] [phone] 08/10/2011, 4:37 PM

## 2011-08-10 NOTE — Tx Team (Addendum)
Interdisciplinary Treatment Plan Update (Adult)  Date:  08/10/2011  Time Reviewed:  9:39 AM   Progress in Treatment: Attending groups:  Yes Participating in groups:    Yes Taking medication as prescribed:    Yes Tolerating medication:   Yes, patient has not reported side effects and staff has not noted any side effects Family/Significant other contact made:  Yes, with stepmother and father Patient understands diagnosis:   Not completely Discussing patient identified problems/goals with staff:   Yes Medical problems stabilized or resolved:   Yes Denies suicidal/homicidal ideation:  Yes Issues/concerns per patient self-inventory:   None Other:  New problem(s) identified: Yes, Describe:  no responses to sending out FL-2 yet  Reason for Continuation of Hospitalization: Delusions  Hallucinations Other; describe neurological issues being explored  Interventions implemented related to continuation of hospitalization:  Medication monitoring and adjustment, safety checks Q15 min., group therapy, psychoeducation, collateral contact  Additional comments:  Neuro consult is being ordered as follow up to MRI; patient states today that she is craving cocaine  Estimated length of stay:  5-7 days  Discharge Plan:  Placement is being sought in a dementia unit of a nursing home, parents say could go anywhere in the state  New goal(s):  Not applicable  Review of initial/current patient goals per problem list:   1.  Goal(s):  Reduce depression from 10 to 3  Met:  Yes  Target date:  By Discharge   As evidenced by:  Denies all  2.  Goal(s):  Reduce psychosis to baseline  Met:  No  Target date:  By Discharge   As evidenced by:  Still on 1:1 for disorganized behaviors  3.  Goal(s):  Stabilize on medications  Met:  Yes  Target date:  By Discharge   As evidenced by:  Medications doing well  4.  Goal(s):  Reduce confusion, i.e. Patient will know which clothes are hers (can come off  1:1)  Met:  No  Target date:  By Discharge   As evidenced by:   Making more sense, but still needs 1:1  5.  Goal(s):  Find appropriate placement for discharge  Met:  No  Target date:  By Discharge   As evidenced by:  No facility has yet responded to FL-2   Attendees: Patient:   Did not attend   Family:     Physician:     Nursing:   Robbie Louis, RN 08/10/2011  9:39 AM   Case Manager:  Ambrose Mantle, LCSW 08/10/2011  9:39 AM   Counselor:  Veto Kemps, MT-BC 08/10/2011  9:39 AM   Other:  Armandina Stammer, NP 08/10/11  9:39 AM  Other:  Tacy Learn, RN 08/10/2011 1:04 PM   Other:     Other:      Scribe for Treatment Team:   Sarina Ser, 08/10/2011, 9:39 AM  .

## 2011-08-11 ENCOUNTER — Inpatient Hospital Stay (HOSPITAL_COMMUNITY)
Admission: RE | Admit: 2011-08-11 | Discharge: 2011-08-11 | Disposition: A | Discharge status: Home / Self Care | Payer: PRIVATE HEALTH INSURANCE | Source: Ambulatory Visit | Attending: Neurology | Admitting: Neurology

## 2011-08-11 MED ORDER — DIVALPROEX SODIUM ER 500 MG PO TB24
750.0000 mg | ORAL_TABLET | Freq: Every day | ORAL | Status: DC
  Administered 2011-08-11: 750 mg via ORAL
  Filled 2011-08-11 (×2): qty 1

## 2011-08-11 NOTE — Progress Notes (Signed)
[facility] Group Notes:  (Counselor/Nursing/MHT/Case Management/Adjunct)  08/11/2011 12:37 PM  Type of Therapy:  Group Therapy  Participation Level:  Minimal  Participation Quality:  Attentive and Sharing  Affect:  Blunted  Cognitive:  Confused  Insight:  Limited  Engagement in Group:  Limited  Engagement in Therapy:  Limited  Modes of Intervention:  Education, Orientation and Support  Summary of Progress/Problems: Patient was quiet but attentive. She stated that she was on her own and explained to say that now she was not on a one-to-one. Feels good about this because people aren't coming in her room all the time. Starting to become clearer and able to carry on conversation.   Vanessa Hoffman 08/11/2011, 12:37 PM

## 2011-08-11 NOTE — Progress Notes (Signed)
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  R/O Vascular Dementia.  R/O Acute Delirium.   The patient was seen today and reports the following:   Sleep: The patient reports to sleeping well last night.  Appetite: The patient reports a good appetite.   Mild>(1-10) >Severe  Hopelessness (1-10): 3  Depression (1-10): 3  Anxiety (1-10): 3   Suicidal Ideation: The patient adamantly denies any suicidal ideations today.  Plan: No  Intent: No  Means: No   Homicidal Ideation: The patient denies any homicidal ideations today.  Plan: No  Intent: No.  Means: No   Eye Contact: Good.  General Appearance Vanessa Hoffman: Neat and Clean today and more alert.  Motor Behavior: Appropriate.  Speech: Much clearer today with appropriate rate and volume.  More coherent speech today.  Mental Status: AO x 3 today.  Level of Consciousness: Alert and oriented x 3..  Mood: Mildly depressed.  Affect: Mildly Constricted.  Anxiety: Mild anxiety.  Thought Process: Mildly Confused at times. Thought Content: No auditory or visual hallucinations reported or observed. No delusional thinking reported.  Perception: Mild Confusion today.  Judgment: Fair.  Insight: Poor.  Cognition: Orientation time, place and person.  Sleep:  Number of Hours: 6.5   Vital Signs:Blood pressure 113/72, pulse 69, temperature 97.1 F (36.2 C), temperature source Oral, resp. rate 18, weight 68.13 kg (150 lb 3.2 oz), SpO2 97.00%.  Lab Results:  Results for orders placed during the hospital encounter of 07/28/11 (from the past 48 hour(s))  RPR     Status: Normal   Collection Time   08/10/11  7:48 PM      Component Value Range Comment   RPR NON REACTIVE  NON REACTIVE    FOLATE     Status: Normal   Collection Time   08/10/11  7:48 PM      Component Value Range Comment   Folate >20.0     VITAMIN B12     Status: Normal   Collection Time   08/10/11  7:48 PM        Component Value Range Comment   Vitamin B-12 662  211 - 911 (pg/mL)    CT OF HEAD: IMPRESSION: July 31, 2011  Mild chronic changes in the white matter are stable from prior  study. No acute abnormality. Negative for mass lesion.   MRI RESULTS: (August 06, 2011)  IMPRESSION:  1. No acute intracranial abnormality.  2. Age advanced but nonspecific cerebral white matter signal  changes. Differential considerations include  accelerated/hereditary small vessel ischemia, sequelae of trauma,  hypercoagulable state, vasculitis, migraines, prior infection or  demyelination.  3. Generalized cerebral volume loss which may be mildly advanced  for age. No disproportionate areas of atrophy are identified.  4. Mild paranasal sinus and right mastoid inflammatory changes.  Imaging Studies continue to be non-specific and suggestive but not diagnostic of a dementing illness.   Treatment Plan Summary:  1. Daily contact with patient to assess and evaluate symptoms and progress in treatment  2. Medication management  3. The patient will deny suicidal ideations or homicidal ideations for 48 hours prior to discharge and have a depression and anxiety rating of 3 or less. The patient will also deny any auditory or visual hallucinations or delusional thinking or display any manic or hypomanic behaviors.   Plan:  1. Will continue current medications.  2. Will d/c 1:1 today.  3. Neurology Consult completed.  EEG pending. 4. Will decrease Depakote ER 750 mgs po qhs to further reduce medications.  As medications are reduced, the patient's confusion appears to clear suggesting a diagnosis of delirium.  5. Continue to monitor.  Vanessa Hoffman 08/11/2011, 2:54 PM

## 2011-08-11 NOTE — Progress Notes (Signed)
[facility] Group Notes:  (Counselor/Nursing/MHT/Case Management/Adjunct)  08/11/2011 12:32 PM  Type of Therapy:  Group Therapy 08/10/2011  Participation Level:  Minimal  Participation Quality:  Attentive and Sharing  Affect:  Blunted  Cognitive:  Confused  Insight:  None  Engagement in Group:  Limited  Engagement in Therapy:  Limited  Modes of Intervention:  Clarification, Orientation and Support  Summary of Progress/Problems: Patient was a little clearer in conversation today but did not initiate interaction   Rakin Lemelle, Aram Beecham 08/11/2011, 12:32 PM

## 2011-08-11 NOTE — Progress Notes (Signed)
Patient ID: Vanessa Hoffman, female   DOB: Jun 04, 1956, 55 y.o.   MRN: [id number] See 1:1 note

## 2011-08-12 MED ORDER — DIVALPROEX SODIUM ER 500 MG PO TB24
500.0000 mg | ORAL_TABLET | Freq: Every day | ORAL | Status: DC
  Administered 2011-08-12 – 2011-08-13 (×2): 500 mg via ORAL
  Filled 2011-08-12 (×3): qty 1

## 2011-08-12 NOTE — BH Assessment (Signed)
Pt states that she has SI off and of. Pt agrees to contract. Pt denies HI/AVH. Pt states that she did not eat well. Pt states that her appetite is good. Her energy level is low. She rates her depression a 1 and hopelessness a 5. Pt is anxious and intrusive. Pt is cooperative and she does attend groups. Pt does show confusion when conversing.

## 2011-08-12 NOTE — Progress Notes (Signed)
Patient ID: Vanessa Hoffman, female   DOB: 1955/12/31, 55 y.o.   MRN: [id number] Nursing Note 08/12/2011 - 1106 Received a call from Ms. Lequita Asal, at [phone], requesting information regarding [name].  The Clinical research associate participated in treatment team with clinical staff.  Informed Darl Pikes the patient had received MRI, and neuro consult.  Currently unable to provide a concrete diagnoses of Dementia at this time.  Patient arrived with multiple medications and several medications have been tapered off, and some improvement noted by the clinical team.  Current recommendation to taper Depakote.  Case Management currently working on placement, however the patient is going to required a lock-unit with supervision, which creates a challenge.  Other recommendations by physician include additional f/u with neurology.  Darl Pikes provided the number of a family friend who would be of assistance with placement - Ginnie Smart [phone]) [phone], per Dyke Brackett will not be able to return to Susan's home at the time of discharge.  Darl Pikes provide information of a Georjean Mode, attorney [phone] assist with insurance concerns (husband).  The writer informed Darl Pikes, that the patient would be discharged when [facility] no longer met the criteria for admission and we would assist with placement, to the best of our ability.  The writer will share this information with the treatment team.

## 2011-08-12 NOTE — Progress Notes (Signed)
Patient ID: Vanessa Hoffman, female   DOB: April 02, 1956, 55 y.o.   MRN: [id number]  Pt was anxious and asked several times if she could get her meds and go to bed. Writer encouraged pt to stay up and attend group. Informed that after group she would be given her hs meds. Pt stated that she was upset earlier in the morning with the nurse. Stated she asked for something for her nerves after she was taken off the 1:1 and was told she couldn't have anything.  "I can't believe I couldn't get something after coming down from that 1 on 1". Writer asked pt if she eventually felt better without the meds and was told "yes". Support and encouragement was offered.

## 2011-08-12 NOTE — Progress Notes (Signed)
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  R/O Vascular Dementia.  R/O Acute Delirium.   The patient was seen today and reports the following:   Sleep: The patient reports to sleeping well last night.  Appetite: The patient reports a good appetite.   Mild>(1-10) >Severe  Hopelessness (1-10): 3  Depression (1-10): 0  Anxiety (1-10): 0   Suicidal Ideation: The patient adamantly denies any suicidal ideations today.  Plan: No  Intent: No  Means: No   Homicidal Ideation: The patient denies any homicidal ideations today.  Plan: No  Intent: No.  Means: No   Eye Contact: Good.  General Appearance Vanessa Hoffman: Neat and Clean today and alert.  Motor Behavior: Appropriate.  Speech: Much clearer today with appropriate rate and volume. More coherent speech today.  Mental Status: AO x 3 today.  Level of Consciousness: Alert and oriented x 3..  Mood: Euthymic.  Affect: Mildly Constricted.  Anxiety: None reported.  Thought Process: Mildly confused at times but this is episodic and improving. Thought Content: No auditory or visual hallucinations reported or observed. No delusional thinking reported.  Perception: Mild Confusion today.  Judgment: Fair.  Insight: Fair.  Cognition: Orientation time, place and person.  Sleep:  Number of Hours: 6.5   Vital Signs:Blood pressure 94/61, pulse 84, temperature 99 F (37.2 C), temperature source Oral, resp. rate 18, weight 68.13 kg (150 lb 3.2 oz), SpO2 97.00%.  Lab Results:  Results for orders placed during the hospital encounter of 07/28/11 (from the past 48 hour(s))  RPR     Status: Normal   Collection Time   08/10/11  7:48 PM      Component Value Range Comment   RPR NON REACTIVE  NON REACTIVE    FOLATE     Status: Normal   Collection Time   08/10/11  7:48 PM      Component Value Range Comment   Folate >20.0     VITAMIN B12     Status: Normal   Collection Time   08/10/11  7:48 PM      Component Value Range Comment   Vitamin B-12 662  211 - 911 (pg/mL)    CT OF HEAD: IMPRESSION: July 31, 2011  Mild chronic changes in the white matter are stable from prior  study. No acute abnormality. Negative for mass lesion.   MRI RESULTS: (August 06, 2011)  IMPRESSION:  1. No acute intracranial abnormality.  2. Age advanced but nonspecific cerebral white matter signal  changes. Differential considerations include  accelerated/hereditary small vessel ischemia, sequelae of trauma,  hypercoagulable state, vasculitis, migraines, prior infection or  demyelination.  3. Generalized cerebral volume loss which may be mildly advanced  for age. No disproportionate areas of atrophy are identified.  4. Mild paranasal sinus and right mastoid inflammatory changes.  Imaging Studies continue to be non-specific and suggestive but not diagnostic of a dementing illness.   The patient initially was coherent and then began to discuss that her father was coming to visit today to "unify two churches."   Treatment Plan Summary:  1. Daily contact with patient to assess and evaluate symptoms and progress in treatment  2. Medication management  3. The patient will deny suicidal ideations or homicidal ideations for 48 hours prior to discharge and have a depression and anxiety rating of 3 or less. The patient will also deny any auditory or visual hallucinations or delusional thinking or display any  manic or hypomanic behaviors.   Plan:  1. Will continue current medications.  2. Will decrease Depakote ER to 500 mgs po qhs to further reduce possibly unnecessary medications.  3. Continue to monitor.  Daniele Yankowski 08/12/2011, 2:00 PM

## 2011-08-12 NOTE — Progress Notes (Signed)
Recreation Therapy Group Note  Date: 08/12/2011         Time: 0930      Group Topic/Focus: The focus of this group is on enhancing patients' problem solving skills, which involves identifying the problem, brainstorming solutions and choosing and trying a solution.   Participation Level: Active  Participation Quality: Resistant and Redirectable  Affect: Irritable  Cognitive: Alert   Additional Comments: Patient reports that she is looking forward to today because "the two churches are coming together" patient appeared to be insinuating that the churches were here at [facility] and had been "feuding" lately.  Nicci Vaughan 08/12/2011 10:11 AM

## 2011-08-12 NOTE — Discharge Planning (Signed)
[facility], which has a special care, locked unit, is coming to interview Floriene on Thursday 12/20 at 2:00.  Ambrose Mantle, LCSW 08/12/2011, 2:16 PM

## 2011-08-12 NOTE — Discharge Planning (Signed)
Met with patient in Aftercare Planning Group.   Today patient was without a 1:1 for the first time, and stated she had been lonely much of the time, and that yesterday was the "worst day since I got here" because of not having someone with her all the time.  Said that she got quite upset with the staff because they "would not let me walk up to the store."  Patient was pleasant and well-spoken although disorganized.  Case Manager discussed with her briefly that her medications were being changed, and she appeared surprised by this.  Reminded her that the doctor talks to her daily about her medications.  In Treatment Team, case was discussed, and it was decided that no additional efforts can be made to send FL-2 to more sites until it is better known through medication stabilization whether this is truly white-matter-related dementia versus delrium induced from over-medication.  There is no question, however, that placement will be needed, because family has made it clear she cannot return there.  Ambrose Mantle, LCSW 08/12/2011, 1:28 PM

## 2011-08-12 NOTE — Procedures (Signed)
EEG NUMBER:  REFERRING PHYSICIAN:  Dr. Duffy Rhody.  HISTORY:  A 55 year old female with history of alcoholism and depression and evaluated for altered mental status.  MEDICATIONS:  Aspirin, Aricept, Microzide, Benicar, Inderal, Zoloft.  CONDITIONS OF RECORDING:  This is a 16-channel EEG carried out with the patient in the awake state.  DESCRIPTION:  The waking background activity consists of a low-voltage, symmetrical, fairly well-organized, 9 Hz alpha activity seen from the parieto-occipital and posterotemporal regions.  Low-voltage, fast activity, poorly organized was seen anteriorly at times, superimposed on more posterior rhythms.  A mixture of theta and alpha was seen from the central and temporal regions.  The patient does not drowse or sleep. Hypoventilation was performed and elicited a mild buildup, but failed to elicit any abnormalities.  Intermittent photic stimulation was not performed.  IMPRESSION:  This is a normal awake EEG.  COMMENT:  In the EEG, the patient is sleep deprived to elicit drowse and light sleep may be desirable to further elicit the possible seizure disorder.          ______________________________ Thana Farr, MD    JX:[id number] D:  08/12/2011 10:24:04  T:  08/12/2011 82:95:62  Job #:  [id number]

## 2011-08-13 LAB — CBC
MCV: 93.8 fL (ref 78.0–100.0)
Platelets: 142 10*3/uL — ABNORMAL LOW (ref 150–400)
RDW: 12.9 % (ref 11.5–15.5)
WBC: 12.9 10*3/uL — ABNORMAL HIGH (ref 4.0–10.5)

## 2011-08-13 LAB — DIFFERENTIAL
Basophils Absolute: 0 10*3/uL (ref 0.0–0.1)
Lymphocytes Relative: 34 % (ref 12–46)
Lymphs Abs: 4.4 10*3/uL — ABNORMAL HIGH (ref 0.7–4.0)
Monocytes Relative: 11 % (ref 3–12)

## 2011-08-13 LAB — COMPREHENSIVE METABOLIC PANEL
Alkaline Phosphatase: 45 U/L (ref 39–117)
BUN: 26 mg/dL — ABNORMAL HIGH (ref 6–23)
Chloride: 99 mEq/L (ref 96–112)
GFR calc Af Amer: 77 mL/min — ABNORMAL LOW (ref >90)
GFR calc non Af Amer: 66 mL/min — ABNORMAL LOW (ref >90)
Glucose, Bld: 143 mg/dL — ABNORMAL HIGH (ref 70–99)
Potassium: 3.7 mEq/L (ref 3.5–5.1)
Total Bilirubin: 0.1 mg/dL — ABNORMAL LOW (ref 0.3–1.2)

## 2011-08-13 LAB — RETICULOCYTES
RBC.: 3.05 MIL/uL — ABNORMAL LOW (ref 3.87–5.11)
Retic Ct Pct: 0.6 % (ref 0.4–3.1)

## 2011-08-13 NOTE — Progress Notes (Signed)
Pt. was demanding her HS meds before 8PM. Pt.was advised that it was too early for bedtime meds.The clock in the dayroom was indicating that it was almost 9 PM (wrong time;off by 1 hour)Pt.became agitated and stated that we  should go by that clock ,right time or not. Pt. did calm down & ended up going to [place].Pt. continues on 15 minute checks. Pt. safety maintained.

## 2011-08-13 NOTE — Progress Notes (Signed)
Patient ID: Vanessa Hoffman, female   DOB: February 09, 1956, 55 y.o.   MRN: [id number] Pt approached the writer in what appeared to be an attempt at shaking the writer's hand. Writer extended her hand and the pt grabbed for the writer's keys that were in the writer's other hand.  Boundary issues were reinforced and pt asked if the keys were "house keys". Pt was more paranoid today than previous days.  "They're all talking about this hope shit and going to a better place to check out". Stated that the furniture has been moved 3 times and that the place is too clean.  Said she couldn't talk much because "they" were probably listening.  When asked about her day during group, stated, " A long day because it's dark til 3:00, then the next day it's dark at 6:00." Said it's so much stuff to do, and that she hates being in the room so long. Writer and group facilitator feel that pt is referencing treatment teams. Support and encouragement was offered.

## 2011-08-13 NOTE — Progress Notes (Addendum)
Patient out of her room and interacting well with others today.  Somewhat fixated on the relationship, or lack of relationship, with her daughter.  Met with neurologist this morning.  Patient is more oriented today.  Met briefly with lawyer from [employer] regarding disability.  Appetite good today.  Blood pressure quite low on the first check this morning with the [employer], but 114/70 on manual recheck.  Pleasant and cooperative with staff.  Patient was very agitated and did not know how she got here.  Talking about a tape we have made of her and how we have recorded her dreams and are giving her medications to keep her here.  I talked at length with the patient and offered reassurance that we were trying to help her and that she had gotten better since admission because of the medications we have been giving her.  Patient was given 2 mg of Haldol PO and took the pills out of her mouth as soon as I left the room.  Case manager witnessed this and came to me.  I went back into the room and confronted the patient.  She then took the medication with a full glass of water and continued talking with [place] for several minutes.  She wants to be able to visit with her family this evening stating she has not seen them since she has been here.  They have been here every day since she got here, but she has no recall.  Family reports that patient has a history of TBI and that they were told a few years ago that she might be developing early dementia.  They are trying to piece together when her symptoms worsened.  Father and step-mother visiting at this time.  Mother was in at lunch time.

## 2011-08-13 NOTE — Progress Notes (Signed)
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  R/O Vascular Dementia.  R/O Acute Delirium.   The patient was seen today and reports the following:   Sleep: The patient reports to having some difficulty sleeping last night due to noise on the unit.  Appetite: The patient reports a good appetite.   Mild>(1-10) >Severe  Hopelessness (1-10): 0  Depression (1-10): 0  Anxiety (1-10): 0   Suicidal Ideation: The patient adamantly denies any suicidal ideations today.  Plan: No  Intent: No  Means: No   Homicidal Ideation: The patient adamantly denies any homicidal ideations today.  Plan: No  Intent: No.  Means: No   Eye Contact: Good.  General Appearance Vanessa Hoffman: Neat and Clean today and alert.  Motor Behavior: Appropriate.  Speech: Much clearer today with appropriate rate and volume. More coherent speech today.  Mental Status: AO x 3 today.  Level of Consciousness: Alert and oriented x 3..  Mood: Euthymic.  Affect: Mildly Constricted.  Anxiety: None reported.  Thought Process: Mildly confused at times but this is episodic and continues to improve.  Thought Content: No auditory or visual hallucinations reported or observed. No delusional thinking reported.  Perception: Mild Confusion today.  Judgment: Fair.  Insight: Fair.  Cognition: Orientation time, place and person.  Sleep:  Number of Hours: 6.5   Vital Signs:Blood pressure 114/70, pulse 79, temperature 99.2 F (37.3 C), temperature source Oral, resp. rate 18, weight 68.13 kg (150 lb 3.2 oz), SpO2 97.00%.  Lab Results: No results found for this or any previous visit (from the past 48 hour(s)).  The patient is more coherent today with episodic confusion.  The patient presents in a way similar to an early dementia.  She will have periods of coherent and then confabulate when she forgets what she is trying to say.  This confusion is continuing to slowly  improve and it is difficult at this time to determine her baseline.  CT OF HEAD: IMPRESSION: July 31, 2011  Mild chronic changes in the white matter are stable from prior  study. No acute abnormality. Negative for mass lesion.   MRI RESULTS: (August 06, 2011)  IMPRESSION:  1. No acute intracranial abnormality.  2. Age advanced but nonspecific cerebral white matter signal  changes. Differential considerations include  accelerated/hereditary small vessel ischemia, sequelae of trauma,  hypercoagulable state, vasculitis, migraines, prior infection or  demyelination.  3. Generalized cerebral volume loss which may be mildly advanced  for age. No disproportionate areas of atrophy are identified.  4. Mild paranasal sinus and right mastoid inflammatory changes.  Imaging Studies continue to be non-specific and suggestive but not diagnostic of a dementing illness.  The patient initially was coherent and then began to discuss that her father was coming to visit today to "unify two churches."   EEG:  According to verbal communication with the Neurologist, the patient's EEG was unremarkable.  This Physician however has not seen the study.  Treatment Plan Summary:  1. Daily contact with patient to assess and evaluate symptoms and progress in treatment  2. Medication management  3. The patient will deny suicidal ideations or homicidal ideations for 48 hours prior to discharge and have a depression and anxiety rating of 3 or less. The patient will also deny any auditory or visual hallucinations or delusional thinking or display any manic or hypomanic behaviors.   Plan:  1. Will continue current medications.  2. Continue to  monitor.   Vanessa Hoffman 08/13/2011, 1:11 PM

## 2011-08-13 NOTE — Progress Notes (Signed)
Subjective: Per report of staff seems to be steadily improving.  Family is requesting placement.  EEG is unremarkable.  Objective: Vital signs in last 24 hours: Temp:  [99.2 F (37.3 C)] 99.2 F (37.3 C) (12/20 0747) Pulse Rate:  [79-80] 79  (12/20 0748) Resp:  [18] 18  (12/20 0747) BP: (82-114)/(52-70) 114/70 mmHg (12/20 0858)  Intake/Output from previous day:   Intake/Output this shift:   Nutritional status: General  Neurologic Exam: Mental Status: Alert, oriented, Able to name Presidents back through [place].  Speech fluent without evidence of aphasia.  Able to follow 3 step commands without difficulty. Cranial Nerves: II: visual fields grossly normal, pupils equal, round, reactive to light and accommodation III,IV, VI: ptosis not present, extra-ocular motions intact bilaterally V,VII: smile symmetric, facial light touch sensation normal bilaterally VIII: hearing normal bilaterally IX,X: gag reflex present XI: trapezius strength/neck flexion strength normal bilaterally XII: tongue strength normal  Motor: Right : Upper extremity   5/5    Left:     Upper extremity   5/5  Lower extremity   5/5     Lower extremity   5/5 Tone and bulk:normal tone throughout; no atrophy noted Sensory: Pinprick and light touch intact throughout, bilaterally Deep Tendon Reflexes: 2+ and symmetric throughout Plantars: Right: downgoing   Left: downgoing Cerebellar: normal finger-to-nose, normal rapid alternating movements and normal heel-to-shin test normal gait and station    Lab Results: No results found for this basename: WBC:2,HGB:2,HCT:2,PLT:2,NA:2,K:2,CL:2,CO2:2,GLUCOSE:2,BUN:2,CREATININE:2,CALCIUM:2,LABA1C in the last 72 hours Lipid Panel No results found for this basename: CHOL,TRIG,HDL,CHOLHDL,VLDL,LDLCALC in the last 72 hours  Studies/Results: MRI HEAD WITHOUT AND WITH CONTRAST  Technique: Multiplanar, multiecho pulse sequences of the brain and surrounding structures were  obtained according to standard protocol without and with intravenous contrast  Contrast: 15mL MULTIHANCE GADOBENATE DIMEGLUMINE 529 MG/ML IV SOLN  Comparison: Head CTs 07/31/2011 and earlier.  Findings: Generalized cerebral volume is at the lower limits of normal for age or may be mildly advanced. No areas of disproportionate volume loss are identified. No restricted diffusion to suggest acute infarction. No midline shift, mass effect, evidence of mass lesion, ventriculomegaly, extra-axial collection or acute intracranial hemorrhage. Cervicomedullary junction and pituitary are within normal limits. Major intracranial vascular flow voids are preserved.  Age advanced scattered cerebral white matter T2 and FLAIR hyperintensity is in a nonspecific pattern. Much of this is subcortical. The left temporal lobe is mildly involved. The deep gray matter nuclei are normal. The brain stem and cerebellum are within normal limits. Grossly normal visualized cervical spine. No abnormal enhancement identified.  Normal bone marrow signal. Fluid in the inferior right mastoid air cells. Negative visualized nasopharynx. Widespread but mild. Widespread but mild paranasal sinus mucosal thickening. Negative visualized orbit and scalp soft tissues.  IMPRESSION: 1. No acute intracranial abnormality. 2. Age advanced but nonspecific cerebral white matter signal changes. Differential considerations include accelerated/hereditary small vessel ischemia, sequelae of trauma, hypercoagulable state, vasculitis, migraines, prior infection or demyelination. 3. Generalized cerebral volume loss which may be mildly advanced for age. No disproportionate areas of atrophy are identified. 4. Mild paranasal sinus and right mastoid inflammatory changes.  Original Report Authenticated By: Harley Hallmark, M.D.   Medications:  I have reviewed the patient's current medications. Scheduled:   . aspirin EC  81 mg Oral  Daily  . cholecalciferol  1,000 Units Oral Daily  . divalproex  500 mg Oral QHS  . donepezil  5 mg Oral QHS  . hydrochlorothiazide  12.5 mg Oral Daily  . multivitamins  with iron  1 tablet Oral Daily  . olmesartan  20 mg Oral Daily  . propranolol  20 mg Oral BH-qamhs  . sertraline  200 mg Oral Daily  . simvastatin  10 mg Oral QHS  . traZODone  100 mg Oral QHS  . DISCONTD: divalproex  750 mg Oral QHS    Assessment/Plan:  Patient Active Hospital Problem List: Mental status change   Assessment;  Patient is clearly improving with this hospitalization.  Based on the evaluation here it would be difficult to give her a diagnosis of dementia.  Work up for other causes of mental status changes has been unremarkable and include a normal TSH, B12, RPR and ESR.  Imaging showed some small vessel ischemic changes but in an of itself is not diagnostic of a vascular dementia.     Plan: 1.  May require formal neuropsych testing as an outpatient.  No further inpatient work up recommended at this time.  Case discussed with Dr. Allena Katz.    LOS: 16 days   Thana Farr, MD [facility] [phone] 08/13/2011  1:55 PM

## 2011-08-14 LAB — IRON AND TIBC: Saturation Ratios: 35 % (ref 20–55)

## 2011-08-14 LAB — VITAMIN B12: Vitamin B-12: 645 pg/mL (ref 211–911)

## 2011-08-14 MED ORDER — RISPERIDONE 0.5 MG PO TBDP
0.5000 mg | ORAL_TABLET | Freq: Every day | ORAL | Status: DC
  Administered 2011-08-14 – 2011-08-20 (×7): 0.5 mg via ORAL
  Filled 2011-08-14: qty 1
  Filled 2011-08-14: qty 10
  Filled 2011-08-14 (×7): qty 1

## 2011-08-14 MED ORDER — DONEPEZIL HCL 10 MG PO TABS
10.0000 mg | ORAL_TABLET | Freq: Every day | ORAL | Status: DC
  Administered 2011-08-14 – 2011-08-20 (×7): 10 mg via ORAL
  Filled 2011-08-14 (×7): qty 1
  Filled 2011-08-14: qty 10
  Filled 2011-08-14: qty 1

## 2011-08-14 MED ORDER — DIVALPROEX SODIUM ER 250 MG PO TB24
250.0000 mg | ORAL_TABLET | Freq: Every day | ORAL | Status: DC
  Administered 2011-08-14 – 2011-08-16 (×3): 250 mg via ORAL
  Filled 2011-08-14 (×5): qty 1

## 2011-08-14 NOTE — Progress Notes (Signed)
[facility] Group Notes:  (Counselor/Nursing/MHT/Case Management/Adjunct)  08/14/2011 4:29 PM  Type of Therapy:  Music Therapy 08/13/2011  Participation Level:  Active  Participation Quality:  Attentive  Affect:  Blunted  Cognitive:  Oriented  Insight:  Good  Engagement in Group:  Good  Engagement in Therapy:  Good  Modes of Intervention:  Socialization, Support and Music  Summary of Progress/Problems: Patient participated in Christmas music activities. Worked with her peers as a Administrator, Civil Service. Nature conservation officer. No periods of confusion.   [name] 08/14/2011, 4:29 PM

## 2011-08-14 NOTE — Progress Notes (Signed)
Pt has been visible on the unit. Has been attending groups. Denies SI. Rates depression at 5 and feeling hopeless at 3. Sleeping and eating well. She continues to have problems with memory and confusion. Pt has new orders to be started on aricept and risperdone M-tab. She has been cooperative and is pleasant during interactions. Remains safe on every fifteen minute safety checks.

## 2011-08-14 NOTE — Progress Notes (Signed)
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  R/O Vascular Dementia.  R/O Acute Delirium.   The patient was seen today and reports the following:   Sleep:  The patient reports to sleeping well last night.  Appetite:  The patient reports a good appetite.   Mild>(1-10) >Severe  Hopelessness (1-10): 0  Depression (1-10): 0  Anxiety (1-10): 0   Suicidal Ideation: The patient adamantly denies any suicidal ideations today.  Plan: No  Intent: No  Means: No   Homicidal Ideation: The patient adamantly denies any homicidal ideations today.  Plan: No  Intent: No.  Means: No   Eye Contact: Good.  General Appearance Vanessa Hoffman: Neat and Clean today and alert.  Motor Behavior: Appropriate.  Speech: Continuing to be clearer today with appropriate rate and volume. More coherent speech today.  Mental Status: AO x 3 today.  Level of Consciousness: Alert and oriented x 3..  Mood: Euthymic.  Affect: Mildly Constricted.  Anxiety: None reported.  Thought Process: Mildly confused at times but this remains episodic and continues to improve.  Thought Content: No auditory or visual hallucinations reported or observed. No delusional thinking reported.  Perception: Mild Confusion today.  Judgment: Fair.  Insight: Fair.  Cognition: Orientation time, place and person.  Sleep:  Number of Hours: 6.5   Vital Signs:Blood pressure 86/61, pulse 86, temperature 98.1 F (36.7 C), temperature source Oral, resp. rate 16, weight 68.13 kg (150 lb 3.2 oz), SpO2 97.00%.  Lab Results:  Results for orders placed during the hospital encounter of 07/28/11 (from the past 48 hour(s))  COMPREHENSIVE METABOLIC PANEL     Status: Abnormal   Collection Time   08/13/11  8:00 PM      Component Value Range Comment   Sodium 136  135 - 145 (mEq/L)    Potassium 3.7  3.5 - 5.1 (mEq/L)    Chloride 99  96 - 112 (mEq/L)    CO2 29  19 - 32 (mEq/L)    Glucose, Bld 143 (*) 70 - 99 (mg/dL)    BUN 26 (*) 6 - 23 (mg/dL)    Creatinine, Ser 0.45  0.50 - 1.10 (mg/dL)    Calcium 9.4  8.4 - 10.5 (mg/dL)    Total Protein 7.0  6.0 - 8.3 (g/dL)    Albumin 3.5  3.5 - 5.2 (g/dL)    AST 16  0 - 37 (U/L)    ALT 12  0 - 35 (U/L)    Alkaline Phosphatase 45  39 - 117 (U/L)    Total Bilirubin 0.1 (*) 0.3 - 1.2 (mg/dL)    GFR calc non Af Amer 66 (*) >90 (mL/min)    GFR calc Af Amer 77 (*) >90 (mL/min)   CBC     Status: Abnormal   Collection Time   08/13/11  8:00 PM      Component Value Range Comment   WBC 12.9 (*) 4.0 - 10.5 (K/uL)    RBC 3.05 (*) 3.87 - 5.11 (MIL/uL)    Hemoglobin 9.6 (*) 12.0 - 15.0 (g/dL)    HCT 40.9 (*) 81.1 - 46.0 (%)    MCV 93.8  78.0 - 100.0 (fL)    MCH 31.5  26.0 - 34.0 (pg)    MCHC 33.6  30.0 - 36.0 (g/dL)    RDW 91.4  78.2 - 95.6 (%)    Platelets 142 (*) 150 - 400 (K/uL)   DIFFERENTIAL  Status: Abnormal   Collection Time   08/13/11  8:00 PM      Component Value Range Comment   Neutrophils Relative 50  43 - 77 (%)    Lymphocytes Relative 34  12 - 46 (%)    Monocytes Relative 11  3 - 12 (%)    Eosinophils Relative 5  0 - 5 (%)    Basophils Relative 0  0 - 1 (%)    Neutro Abs 6.5  1.7 - 7.7 (K/uL)    Lymphs Abs 4.4 (*) 0.7 - 4.0 (K/uL)    Monocytes Absolute 1.4 (*) 0.1 - 1.0 (K/uL)    Eosinophils Absolute 0.6  0.0 - 0.7 (K/uL)    Basophils Absolute 0.0  0.0 - 0.1 (K/uL)    WBC Morphology TOXIC GRANULATION   ATYPICAL LYMPHOCYTES   Smear Review LARGE PLATELETS PRESENT     VITAMIN D 25 HYDROXY     Status: Normal   Collection Time   08/13/11  8:00 PM      Component Value Range Comment   Vit D, 25-Hydroxy 31  30 - 89 (ng/mL)   VITAMIN B12     Status: Normal   Collection Time   08/13/11  8:00 PM      Component Value Range Comment   Vitamin B-12 645  211 - 911 (pg/mL)   FOLATE     Status: Normal   Collection Time   08/13/11  8:00 PM      Component Value Range Comment   Folate 14.6     IRON AND TIBC      Status: Normal   Collection Time   08/13/11  8:00 PM      Component Value Range Comment   Iron 97  42 - 135 (ug/dL)    TIBC 130  865 - 784 (ug/dL)    Saturation Ratios 35  20 - 55 (%)    UIBC 181  125 - 400 (ug/dL)   FERRITIN     Status: Abnormal   Collection Time   08/13/11  8:00 PM      Component Value Range Comment   Ferritin 301 (*) 10 - 291 (ng/mL)   RETICULOCYTES     Status: Abnormal   Collection Time   08/13/11  8:00 PM      Component Value Range Comment   Retic Ct Pct 0.6  0.4 - 3.1 (%)    RBC. 3.05 (*) 3.87 - 5.11 (MIL/uL)    Retic Count, Manual 18.3 (*) 19.0 - 186.0 (K/uL)    CT OF HEAD: IMPRESSION: July 31, 2011  Mild chronic changes in the white matter are stable from prior  study. No acute abnormality. Negative for mass lesion.   MRI RESULTS: (August 06, 2011)  IMPRESSION:  1. No acute intracranial abnormality.  2. Age advanced but nonspecific cerebral white matter signal  changes. Differential considerations include  accelerated/hereditary small vessel ischemia, sequelae of trauma,  hypercoagulable state, vasculitis, migraines, prior infection or  demyelination.  3. Generalized cerebral volume loss which may be mildly advanced  for age. No disproportionate areas of atrophy are identified.  4. Mild paranasal sinus and right mastoid inflammatory changes.  Imaging Studies continue to be non-specific and suggestive but not diagnostic of a dementing illness.  The patient initially was coherent and then began to discuss that her father was coming to visit today to "unify two churches."   EEG: According to verbal communication with the Neurologist, the patient's EEG was unremarkable. This Physician  however has not seen the study.   Treatment Plan Summary:  1. Daily contact with patient to assess and evaluate symptoms and progress in treatment  2. Medication management  3. The patient will deny suicidal ideations or homicidal ideations for 48 hours prior to  discharge and have a depression and anxiety rating of 3 or less. The patient will also deny any auditory or visual hallucinations or delusional thinking or display any manic or hypomanic behaviors.   Plan:  1. Will continue current medications. 2. Will further decrease Depakote ER to 250 mgs po q am since there is no known indication for this medication. 3. Will add Risperdal M-Tab 0.5 mgs po q 2 pm since the patient's pattern is to become more confused and agitated after 2 pm. 4. Will increase Aricept to 10 mgs po q am for confusion.  5. Continue to monitor.   Tiera Mensinger 08/14/2011, 1:17 PM

## 2011-08-14 NOTE — Progress Notes (Signed)
Patient ID: Vanessa Hoffman, female   DOB: [birth year], 55 y.o.   MRN: [id number] PT. IS APP/COOP AND AGREES TO CONTRACT FOR SAFETY.  SHE MAKES NO PHY. COMPLAINTS AND IS INTERACTING WELL WITH PEERS.  SHE IS OPEN AND FRIENDLY TO STAFF AND SHOWS NO SIGNS OF STRESS OR ANXIETY.  MAINTAINS A HAPPY/ PLEASANT AFFECT AND NO BEHAVIOR ISSUES AT THIS TIME

## 2011-08-14 NOTE — Progress Notes (Signed)
Per state regulation 482.30 This chart was reviewed for medical neccessary with respect to the patient's admission/duration of stay. Jeannette How. LCSW 08/14/2011 Next review date:   Pt still waiting to here back from [facility] that has been sent out regarding a bed for her at a facility.

## 2011-08-14 NOTE — Tx Team (Signed)
Interdisciplinary Treatment Plan Update (Adult)  Date:  08/14/2011 Time Reviewed:  12:22 PM  Progress in Treatment: Attending groups: Yes. Participating in groups:  Yes. Taking medication as prescribed:  Yes. Tolerating medication:  Yes. Family/Significant othe contact made:  Yes, individual(s) contacted:  family contacted Patient understands diagnosis:  Yes. and No. Discussing patient identified problems/goals with staff:  Yes. Medical problems stabilized or resolved:  Yes. Denies suicidal/homicidal ideation: Yes. and As evidenced by:  pt report in group Issues/concerns per patient self-inventory:  No. Other:  New problem(s) identified: No, Describe:  no issues  Reason for Continuation of Hospitalization: Medication stabilization  Interventions implemented related to continuation of hospitalization:  Additional comments:  Estimated length of stay: 3-5 days  Discharge Plan: waiting on response from [facility]  New goal(s):  Review of initial/current patient goals per problem list:   1.  Goal(s):deny SI/HI 48 hrs prior to d/c  Met:  Yes  Target date:prior to d/c  As evidenced by:pt reports 0  2.  Goal (s):decrease depression  Met:  Yes  Target date: by d/c date  As evidenced by: pt reports 0  3.  Goal(s):decrease anxiety  Met:  Yes  Target date: by d/c date  As evidenced by: pt reports 0  4.  Goal(s):  Met:  No  Target date:  As evidenced by:  Attendees: Patient:  Vanessa Hoffman 12/21/201212:22 PM  Family:   12/21/201212:22 PM  Physician:  Dr. Harvie Heck Readling 12/21/201212:22 PM  Nursing:   Fransisca Kaufmann 12/21/201212:22 PM  Case Manager:  Jeannette How, LCSW 12/21/201212:22 PM  Counselor:  Veto Kemps 12/21/201212:22 PM  Other:   12/21/201212:22 PM  Other:   12/21/201212:22 PM  Other:   12/21/201212:22 PM  Other:   12/21/201212:22 PM   Scribe for Treatment Team:   Jeannette How, 08/14/2011, 12:22 PM

## 2011-08-14 NOTE — Progress Notes (Signed)
[facility] Group Notes:  (Counselor/Nursing/MHT/Case Management/Adjunct)  08/14/2011 4:27 PM  Type of Therapy:  Group therapy 08/13/2011  Participation Level:  Active  Participation Quality:  Attentive and Sharing  Affect:  Blunted  Cognitive:  Confused  Insight:  Limited  Engagement in Group:  Good  Engagement in Therapy:  Good  Modes of Intervention:  Clarification, Education and Support  Summary of Progress/Problems: Patient was more attentive. She was tearful as she discussed relationship with children. Continues to have periods of confusion. Requested reading glasses. Misses doing her crocheting   Veto Kemps 08/14/2011, 4:27 PM

## 2011-08-14 NOTE — Progress Notes (Signed)
Recreation Therapy Group Note  Date: 08/14/11         Time: 1130      Group Topic/Focus: The focus of the group is on enhancing the patients' ability to cope with stressors by understanding what coping is, why it is important, the negative effects of stress and developing healthier coping skills. Patients practice [place] and discuss how exercise can be used as a healthy coping strategy.   Participation Level: Active  Participation Quality: Appropriate and Attentive  Affect: Blunted  Cognitive: Confused   Additional Comments: None.   Jonna Dittrich 08/14/2011 3:51 PM

## 2011-08-15 DIAGNOSIS — R45851 Suicidal ideations: Secondary | ICD-10-CM

## 2011-08-15 LAB — URINALYSIS, ROUTINE W REFLEX MICROSCOPIC
Bilirubin Urine: NEGATIVE
Hgb urine dipstick: NEGATIVE
Protein, ur: NEGATIVE mg/dL
Urobilinogen, UA: 0.2 mg/dL (ref 0.0–1.0)

## 2011-08-15 NOTE — Progress Notes (Signed)
Patient ID: Vanessa Hoffman, female   DOB: Jul 10, 1956, 55 y.o.   MRN: [id number] Pt. denies lerthality and A/v/H's: Pt. Is appropriate and cooperative with staff and peers. Took HS meds and went to bed. Pt. Awakened for VS at 06:45 B/P remains low (86 systolic). Pt. offered fluids.

## 2011-08-15 NOTE — Progress Notes (Signed)
  TECIA CINNAMON is a 55 y.o. female [id number] [birth year]  07/28/2011 Principal Problem:  *Major depressive disorder, recurrent, severe with psychotic features Active Problems:  Vascular dementia   Mental Status:alert and oriented denies SI/HI/AVH.    Subjective/Objective: Dressed active in [place]. Denies problems with mood appetite or sleep. Wonders if she can go a placement in [place].      Filed Vitals:   08/15/11 0647  BP: 76/54  Pulse: 82  Temp:   Resp:     Lab Results:   BMET    Component Value Date/Time   NA 136 08/13/2011 2000   K 3.7 08/13/2011 2000   CL 99 08/13/2011 2000   CO2 29 08/13/2011 2000   GLUCOSE 143* 08/13/2011 2000   BUN 26* 08/13/2011 2000   CREATININE 0.96 08/13/2011 2000   CALCIUM 9.4 08/13/2011 2000   GFRNONAA 66* 08/13/2011 2000   GFRAA 77* 08/13/2011 2000    Medications:  Scheduled:     . aspirin EC  81 mg Oral Daily  . cholecalciferol  1,000 Units Oral Daily  . divalproex  250 mg Oral QHS  . donepezil  10 mg Oral QHS  . hydrochlorothiazide  12.5 mg Oral Daily  . multivitamins with iron  1 tablet Oral Daily  . olmesartan  20 mg Oral Daily  . propranolol  20 mg Oral BH-qamhs  . risperiDONE  0.5 mg Oral Q1400  . sertraline  200 mg Oral Daily  . simvastatin  10 mg Oral QHS  . traZODone  100 mg Oral QHS  . DISCONTD: divalproex  500 mg Oral QHS  . DISCONTD: donepezil  5 mg Oral QHS     PRN Meds acetaminophen, alum & mag hydroxide-simeth, haloperidol, haloperidol lactate, magnesium hydroxide  Plan: continue current plan of care.           Wonders if she can get placement in [facility]?            Glucose is elevated at 143 as is BUN 26. Will check FBS and kidney function.  Joretta Eads,MICKIE D. 08/15/2011

## 2011-08-15 NOTE — Progress Notes (Signed)
Pt is a little more logical and less confused than the previous week    She is cooperative and compliant   She attends and participates in groups and interacts well with others    Verbal support given  Medications administered and effectiveness monitored   Q 15 min checks   Pt safe at present

## 2011-08-15 NOTE — Progress Notes (Signed)
[facility] Group Notes:  (Counselor/Nursing/MHT/Case Management/Adjunct)  08/15/2011 11 AM  Type of Therapy:  Group Therapy, Dance/Movement Therapy   Participation Level:  Active  Participation Quality:  Attentive, Sharing and Supportive  Affect:  Appropriate  Cognitive:  Appropriate  Insight:  Limited  Engagement in Group:  Limited  Engagement in Therapy:  Limited  Modes of Intervention:  Clarification, Problem-solving, Role-play, Socialization and Support  Summary of Progress/Problems: pt participated in a group discussion and experiential on how to release tension in the body, mind and heart. Pt stated that her shoulders were tense and shared a stretch to bring about calmness and allow the pt to feel more present in their body. Pt was encouraged to repeat this stretch and others during the group and at D/C.   [name]

## 2011-08-16 LAB — BASIC METABOLIC PANEL
BUN: 21 mg/dL (ref 6–23)
Creatinine, Ser: 0.85 mg/dL (ref 0.50–1.10)
GFR calc Af Amer: 89 mL/min — ABNORMAL LOW (ref >90)
GFR calc non Af Amer: 77 mL/min — ABNORMAL LOW (ref >90)
Potassium: 4 mEq/L (ref 3.5–5.1)

## 2011-08-16 NOTE — Progress Notes (Signed)
  Vanessa Hoffman is a 55 y.o. female [id number] 26-Jun-1956  07/28/2011 Principal Problem:  *Major depressive disorder, recurrent, severe with psychotic features Active Problems:  Vascular dementia   Mental Status:alert & oriented mood and affect euthymic.Denies SI/HI/AVH. Dressed and active in [place].    Subjective/Objective:  Kidney function has normalized glucose is less elevated.Says that asking top go to [facility] was a whim.    Filed Vitals:   08/15/11 2139  BP: 100/65  Pulse: 80  Temp:   Resp: 16    Lab Results:   BMET    Component Value Date/Time   NA 141 08/16/2011 0700   K 4.0 08/16/2011 0700   CL 105 08/16/2011 0700   CO2 29 08/16/2011 0700   GLUCOSE 108* 08/16/2011 0700   BUN 21 08/16/2011 0700   CREATININE 0.85 08/16/2011 0700   CALCIUM 9.2 08/16/2011 0700   GFRNONAA 77* 08/16/2011 0700   GFRAA 89* 08/16/2011 0700    Medications:  Scheduled:     . aspirin EC  81 mg Oral Daily  . cholecalciferol  1,000 Units Oral Daily  . divalproex  250 mg Oral QHS  . donepezil  10 mg Oral QHS  . hydrochlorothiazide  12.5 mg Oral Daily  . multivitamins with iron  1 tablet Oral Daily  . olmesartan  20 mg Oral Daily  . propranolol  20 mg Oral BH-qamhs  . risperiDONE  0.5 mg Oral Q1400  . sertraline  200 mg Oral Daily  . simvastatin  10 mg Oral QHS  . traZODone  100 mg Oral QHS     PRN Meds acetaminophen, alum & mag hydroxide-simeth, haloperidol, haloperidol lactate, magnesium hydroxide  Plan Continue current plan of care  Danna Sewell,MICKIE D. 08/16/2011

## 2011-08-16 NOTE — Progress Notes (Signed)
Pt is appropriate and pleasant  She still has some disorganization and mild confusion   She attends and participates in groups and interacts well with others   Verbal support given  Medications administered and effectiveness monitored   Q 15 min checks   Pt safe at present

## 2011-08-16 NOTE — Progress Notes (Signed)
[facility] Group Notes:  (Counselor/Nursing/MHT/Case Management/Adjunct)  08/16/2011 11 AM  Type of Therapy:  Group Therapy, Dance/Movement Therapy   Participation Level:  Active  Participation Quality:  Appropriate and Sharing  Affect:  Appropriate  Cognitive:  Oriented  Insight:  Limited  Engagement in Group:  Good  Engagement in Therapy:  Good  Modes of Intervention:  Clarification, Problem-solving, Role-play, Socialization and Support  Summary of Progress/Problems: Pt participated in a group discussion on how to express and integrate feelings.Pt stated that they felt "turquize" today because it symbolizes "hope" pt. took on posture further expressing this feeling and sharing with the group. Pt had appropriate interactions and sharing with others. Pt shared positive memories of the Christmas season.    [name]

## 2011-08-16 NOTE — Progress Notes (Signed)
Pt is visible in milieu though quiet for the most part. She is appropriate in her behavior and communicates her needs clearly. Pleasant, cooperative. Medicated per orders. Pt denies SI/HI/AVH and remains safe. M Clell Trahan rN

## 2011-08-16 NOTE — Progress Notes (Signed)
Pt resting in bed with eyes closed. RR WNL, even and unlabored. Color WNL. Level III obs continued for safety. Pt is safe. No distress observed, no complaints voiced. Lawrence Marseilles

## 2011-08-17 NOTE — Progress Notes (Signed)
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  R/O Vascular Dementia.  R/O Acute Delirium.   The patient was seen today and reports the following:   Sleep: The patient reports to continuing to sleep well.  Appetite: The patient reports a good appetite.   Mild>(1-10) >Severe  Hopelessness (1-10): 0  Depression (1-10): 0  Anxiety (1-10): 0   Suicidal Ideation: The patient adamantly denies any suicidal ideations today.  Plan: No  Intent: No  Means: No   Homicidal Ideation: The patient adamantly denies any homicidal ideations today.  Plan: No  Intent: No.  Means: No   Eye Contact: Good.  General Appearance Vanessa Hoffman: Neat and Clean today and alert.  Motor Behavior: Appropriate.  Speech: Continuing to be clearer today with appropriate rate and volume. Mostly coherent speech today.  Mental Status: AO x 3 today.  Level of Consciousness: Alert and oriented x 3..  Mood: Euthymic.  Affect: Mildly Constricted.  Anxiety: None reported.  Thought Process: No confusion noted today however her confusion usually occurs in the evening hours.  Thought Content: No auditory or visual hallucinations reported or observed. No delusional thinking reported.  Perception: WNL today.  Judgment: Fair to Good.  Insight: Fair to Good.  Cognition: Orientation time, place and person.  Sleep:  Number of Hours: 6   Vital Signs:Blood pressure 91/61, pulse 84, temperature 98.2 F (36.8 C), temperature source Oral, resp. rate 16, weight 68.13 kg (150 lb 3.2 oz), SpO2 97.00%.  Lab Results:  Results for orders placed during the hospital encounter of 07/28/11 (from the past 48 hour(s))  BASIC METABOLIC PANEL     Status: Abnormal   Collection Time   08/16/11  7:00 AM      Component Value Range Comment   Sodium 141  135 - 145 (mEq/L)    Potassium 4.0  3.5 - 5.1 (mEq/L)    Chloride 105  96 - 112 (mEq/L)    CO2 29  19 - 32 (mEq/L)    Glucose, Bld 108 (*) 70 - 99 (mg/dL)    BUN 21  6 - 23 (mg/dL)    Creatinine, Ser 9.56  0.50 - 1.10 (mg/dL)    Calcium 9.2  8.4 - 10.5 (mg/dL)    GFR calc non Af Amer 77 (*) >90 (mL/min)    GFR calc Af Amer 89 (*) >90 (mL/min)   HEMOGLOBIN A1C     Status: Abnormal   Collection Time   08/16/11  7:00 AM      Component Value Range Comment   Hemoglobin A1C 5.9 (*) <5.7 (%)    Mean Plasma Glucose 123 (*) <117 (mg/dL)     The patient called her Father and Stepmother today to see if it would be possible for her to return to live with them.  The Stepmother called back to the unit and informed Nursing that the patient would not be allowed to return home regardless of what level of improvement she shows.  CT OF HEAD: IMPRESSION: July 31, 2011  Mild chronic changes in the white matter are stable from prior  study. No acute abnormality. Negative for mass lesion.   MRI RESULTS: (August 06, 2011)  IMPRESSION:  1. No acute intracranial abnormality.  2. Age advanced but nonspecific cerebral white matter signal  changes. Differential considerations include  accelerated/hereditary small vessel ischemia, sequelae of trauma,  hypercoagulable state, vasculitis, migraines, prior infection or  demyelination.  3. Generalized cerebral  volume loss which may be mildly advanced  for age. No disproportionate areas of atrophy are identified.  4. Mild paranasal sinus and right mastoid inflammatory changes.  Imaging Studies continue to be non-specific and suggestive but not diagnostic of a dementing illness.  The patient initially was coherent and then began to discuss that her father was coming to visit today to "unify two churches."   EEG: According to verbal communication with the Neurologist, the patient's EEG was unremarkable. This Physician however has not seen the study.   Treatment Plan Summary:  1. Daily contact with patient to assess and evaluate symptoms and progress in treatment  2. Medication  management  3. The patient will deny suicidal ideations or homicidal ideations for 48 hours prior to discharge and have a depression and anxiety rating of 3 or less. The patient will also deny any auditory or visual hallucinations or delusional thinking or display any manic or hypomanic behaviors.   Plan:  1. Will continue current medications.  2. Will discontinue Depakote ER today since the patient showed further improvement with the reduction in this medication. 3. Continue to monitor. 4. Placement needed.  Vanessa Hoffman 08/17/2011, 12:01 PM

## 2011-08-17 NOTE — Progress Notes (Signed)
Pt was up in dayroom upon first assessment.  She was given her self-inventory to fill out which she did on her own.  She rated both her depression and hopelessness a 1 today.  She does admit to feeling a bit anxious because she is wanting to go home for Christmas and her family will not allow her to come back.  She claims that she understands but feels they are just wanting her money.  She stated,"they use my money to pay their bills and I want my pocketbook and checkbook back from them"  We talked about her going to as ALF and asked did she realize her disability check would go there for her room and board.  Again she stated,"well at least they won't have access to my money anymore"  Her step-mother did call up here and told this nurse that they had no intention on brining her home that she would have to go some where but not back with them.  Tried to explain that pt was between 80 and 90 % better but she acted like she didn't want to hear anything.  She did ask if she and her husband should try and visit pt today since she was upset with them.  informed them that she would be upset if they came or not and that they needed to decide which way they wanted her to be upset.  She left the conversation not committing to come or not.  Informed pt and she stated,"well, they can come or not right now I don't care"  Pt did have her lawyer to visit and was asking questions about pt and what the plan was for her.  Pt gave permission to talk with her so told her right now it looked like ALF.  The lawyer is a family friend and plans to speak with pt's parents to let them know how much better pt is really doing to see if they will change their mind.

## 2011-08-17 NOTE — Progress Notes (Signed)
Patient ID: Vanessa Hoffman, female   DOB: May 08, 1956, 55 y.o.   MRN: [id number]  Stated she had a "good day".  "I was getting a little depressed this morning but decided not to let it happen".  Informed the writer that she is disappointed that she's not able to return home.  Support and encouragement was offered.

## 2011-08-17 NOTE — Progress Notes (Signed)
Recreation Therapy Group Note  Date: 08/17/2011         Time: 0930      Group Topic/Focus: The focus of this group is on promoting emotional and psychological well-being through the process of creative expression, relaxation, socialization, fun and enjoyment.  Participation Level: Active  Participation Quality: Appropriate and Attentive  Affect: Appropriate  Cognitive: Oriented   Additional Comments: Patient talked about how she would like to be discharged, but is willing to be patient while all of the placement issues are worked out.   Tomothy Eddins 08/17/2011 9:58 AM

## 2011-08-17 NOTE — Progress Notes (Signed)
Patient ID: Vanessa Hoffman, female   DOB: 13-Jun-1956, 55 y.o.   MRN: [id number] Nursing: Pt. has been asleep all night.

## 2011-08-18 MED ORDER — TRAZODONE HCL 50 MG PO TABS
150.0000 mg | ORAL_TABLET | Freq: Every day | ORAL | Status: DC
  Administered 2011-08-18 – 2011-08-20 (×3): 150 mg via ORAL
  Filled 2011-08-18: qty 3
  Filled 2011-08-18: qty 1
  Filled 2011-08-18: qty 30
  Filled 2011-08-18 (×3): qty 1

## 2011-08-18 MED ORDER — IBUPROFEN 400 MG PO TABS
400.0000 mg | ORAL_TABLET | Freq: Every day | ORAL | Status: DC
  Administered 2011-08-18 – 2011-08-20 (×3): 400 mg via ORAL
  Filled 2011-08-18 (×5): qty 1

## 2011-08-18 NOTE — Progress Notes (Signed)
Report received from [employer] Charity fundraiser.  Patient observed lying in bed awake but voiced no complaints when asked if she needed anything. Safety maintained on unit, will continue to monitor.

## 2011-08-18 NOTE — Progress Notes (Signed)
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  R/O Vascular Dementia.  R/O Acute Delirium.   The patient was seen today and reports the following:   Sleep: The patient reports to continuing to sleep well without difficulty.  Appetite: The patient reports a good appetite.   Mild>(1-10) >Severe  Hopelessness (1-10): 0  Depression (1-10): 0  Anxiety (1-10): 0   Suicidal Ideation: The patient adamantly denies any suicidal ideations today.  Plan: No  Intent: No  Means: No   Homicidal Ideation: The patient adamantly denies any homicidal ideations today.  Plan: No  Intent: No.  Means: No   Eye Contact: Good.  General Appearance Vanessa Hoffman: Neat and Clean today and alert.  Motor Behavior: Appropriate.  Speech: Continuing to be clearer today with appropriate rate and volume. Mostly coherent speech today.  Mental Status: AO x 3 today.  Level of Consciousness: Alert and oriented x 3..  Mood: Euthymic.  Affect: Mildly Constricted.  Anxiety: None reported.  Thought Process: No confusion noted today however her confusion usually occurs in the evening hours.  Thought Content: No auditory or visual hallucinations reported or observed. No delusional thinking reported.  Perception: WNL today.  Judgment: Fair to Good.  Insight: Fair to Good.  Cognition: Orientation time, place and person.  Sleep:  Number of Hours: 5   Vital Signs:Blood pressure 82/55, pulse 87, temperature 98.3 F (36.8 C), temperature source Oral, resp. rate 18, weight 68.13 kg (150 lb 3.2 oz), SpO2 97.00%.  Lab Results: No results found for this or any previous visit (from the past 48 hour(s)).  CT OF HEAD: IMPRESSION: July 31, 2011  Mild chronic changes in the white matter are stable from prior  study. No acute abnormality. Negative for mass lesion.   MRI RESULTS: (August 06, 2011)  IMPRESSION:  1. No acute intracranial abnormality.  2.  Age advanced but nonspecific cerebral white matter signal  changes. Differential considerations include  accelerated/hereditary small vessel ischemia, sequelae of trauma,  hypercoagulable state, vasculitis, migraines, prior infection or  demyelination.  3. Generalized cerebral volume loss which may be mildly advanced  for age. No disproportionate areas of atrophy are identified.  4. Mild paranasal sinus and right mastoid inflammatory changes.  Imaging Studies continue to be non-specific and suggestive but not diagnostic of a dementing illness.   EEG: According to verbal communication with the Neurologist, the patient's EEG was unremarkable. This Physician however has not seen the study.   Treatment Plan Summary:  1. Daily contact with patient to assess and evaluate symptoms and progress in treatment  2. Medication management  3. The patient will deny suicidal ideations or homicidal ideations for 48 hours prior to discharge and have a depression and anxiety rating of 3 or less. The patient will also deny any auditory or visual hallucinations or delusional thinking or display any manic or hypomanic behaviors.   Plan:  1. Will continue current medications.  2. Continue to monitor.  3. Placement needed.  Lionell Matuszak 08/18/2011, 12:57 PM

## 2011-08-18 NOTE — Discharge Planning (Signed)
Met with patient and doctor.  Affect bright, denies psychosis.  No evidence of any.  Focused on getting into ALF.  Explained that we will make referral tomorrow, and see how soon we can get her into a facility.

## 2011-08-18 NOTE — Progress Notes (Signed)
Patient ID: Vanessa Hoffman, female   DOB: 1955/12/31, 55 y.o.   MRN: [id number]   Patient pleasant on approach. Reports feeling better than first admitted. Smiling and interacting with peers appropriately today. Patient denies feeling depressed or having any SI/HI. No overt psychosis noted during our conversation. Staff will monitor.

## 2011-08-18 NOTE — Progress Notes (Signed)
Patient ID: Vanessa Hoffman, female   DOB: 02/20/1956, 54 y.o.   MRN: [id number] 08/18/2011 Nursing 1345 D  Vanessa Hoffman is seen circulating out in the hall....interacting quietly with whomever she sees fit. She makes very brief eye contact with anyone...but when she does, she smiles pleasantly- and warmly- to this nurse. She acknowledges to this nurse that today is Christmas Day. She attended her AM group and has been pleasant to deal with . A She states she has no SI and or HI and can stay safe today. R Safety is maintained and POC includes PD RN Bc

## 2011-08-19 DIAGNOSIS — F062 Psychotic disorder with delusions due to known physiological condition: Secondary | ICD-10-CM

## 2011-08-19 NOTE — Progress Notes (Signed)
Pt was up in hallway upon first assessment.  She was given her self-inventory to fill out but the first thing she said was "my mother will be here today to sign my papers so I can leave" was not sure what she meant by that remark.  Spoke with the CM and the doctor and it seems that she will not have any papers to sign.  Informed pt that her parents would not need to sign any paperwork before she is placed in an ALF or GH.  Pt did voice understanding and stated,"well, I figured I needed them to sign before I could leave no one really said that to me here".  Pt denied any depression, hopelessness or anxiety today.  She has been up and active today participating in groups and interacting with peers and staff appropriately.  She did say that she wouldn't mind if CM found her a place in [place] or [place] that she is ready to go any where that the treatment team thinks is right for her.  The only order today was to d/c her haldol prn injection.

## 2011-08-19 NOTE — Progress Notes (Signed)
Patient ID: MERY GUADALUPE, female   DOB: [birth year], 55 y.o.   MRN: [id number]    Vanessa Hoffman is fully alert and anxious to tell me that she has spoken to her mother today about the possibility of going to an assisted living facility.  She says mother is in agreement with this and will cooperate with making that possible.  Doxie has been working with our Sports coach on ALF placement and I am not exactly sure what role her mother is playing in this, but I have promised I will pass the message on to her case manager.   O:  She is pleasant and cooperative and pleased to see me.  She remembers me from a previous admission many years ago.  Calm, focused, pleased to be going to assisted living.  She is pleased with her medications and feels she can sleep well and concentrate during the day.  Denies any dangerous ideas.  She shows me her information on the [facility] and is looking forward to attending these outpatient appts.  Grooming good, speech non-pressured with normal form and production.    A. Stable for discharge.   P; Continue placement efforts.  Continue current plan.

## 2011-08-19 NOTE — Tx Team (Signed)
Interdisciplinary Treatment Plan Update (Adult)  Date:  08/19/2011  Time Reviewed: 9:27 AM   Progress in Treatment: Attending groups: Yes Participating in groups:  Yes Taking medication as prescribed: Yes Tolerating medication:  Yes Family/Significant othe contact made:   Patient understands diagnosis:  Yes Discussing patient identified problems/goals with staff:  Yes Medical problems stabilized or resolved:  Yes Denies suicidal/homicidal ideation: Yes Issues/concerns per patient self-inventory:  None identified Other:  New problem(s) identified:  Reason for Continuation of Hospitalization: Medication stabilization Other; describe placement issue  Interventions implemented related to continuation of hospitalization: Medication monitoring and adjustment, safety checks q 15 mins, group therapy, psychoeducation for coping skills, collateral contact and discharge planning.   Additional comments:  Estimated length of stay:1-2 days  Discharge Plan: Pt will likely follow up with an assisted living home  New goal(s): N/A  Review of initial/current patient goals per problem list:   1.  Goal(s): Denies SI/HI 48 hrs prior to D/C  Met:  yes  Target date: discharge date  As evidenced by: Pt reports none  2.  Goal (s): Decrease Depression  Met:  yes  Target date: discharge date  As evidenced by: Pt reports 0  3.  Goal(s): Decrease Anxiety   Met: yes     Target date discharge  As evidenced by: reports 0  4.  Goal(s):  Met:    Target date:  As evidenced by:    Attendees: Patient:     Family:     Physician:  Dr. Harvie Heck Reading 4:22 PM 08/19/2011   Nursing:   Lissa Morales, RN 08/19/2011 4:22 PM   Case Manager:  Vanetta Mulders, LPCA 08/19/2011 4:22 PM   Counselor:     Other:  Lynann Bologna, NP 08/19/2011 4:22 PM   Other:     Other:     Other:      Scribe for Treatment Team:   Purcell Nails, LPCA

## 2011-08-19 NOTE — Progress Notes (Signed)
Per State Regulation 482.30  This Chart was reviewed for medical necessity with respect to the patient's Admission/Duration of stay.  Vanetta Mulders LPCA 08/19/2011  Next review 08/22/11
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
## 2011-08-19 NOTE — Progress Notes (Signed)
Pt laying in bed resting with eyes closed. Respirations even and unlabored. No distress noted.  

## 2011-08-20 NOTE — Progress Notes (Signed)
Patient ID: Vanessa Hoffman, female   DOB: [birth year], 55 y.o.   MRN: [id number] Pt is awake and active on the unit this AM. Pt denies SI/HI and A/V hallucinations and is cooperative with staff. Pt B/P was questionably low this AM, so writer took a manual B/P to verify MHT findings. Pt is asymptomatic. Writer consulted with physician to determine if he would still like to administer medications. MD instructed writer to administer medications. Writer taught pt to stay hydrated by drinking non cafinated fluids and to rise slowly in order to ensure that she was stable before ambulating. Pt is attending groups and is in good spirits today. Writer will continue to monitor.

## 2011-08-20 NOTE — Progress Notes (Signed)
Patient ID: Vanessa Hoffman, female   DOB: [birth year], 55 y.o.   MRN: [id number]  Pt informed the writer that she's still upset with her mother. Reiterated the info that writer was given in shift briefing, about pt's mother and whether or not pt would ever put her in a nursing home. Stated, "I told my mom, that's not cool". Referring to fact that her mother is part of the reason she's in [facility]. Support and encouragement was offered.

## 2011-08-20 NOTE — Discharge Planning (Signed)
Met with patient in Aftercare Planning Group.   She feels "stuck" and states that she is better but does not have a place to go.  Gets her own check, and feels she could get an apartment and/or a hotel room until then.  She gave the name of friends and signed a consent, stating that she could go stay there.  Case Manager has left message, awaiting call back.  Does not have a psychiatrist to her knowledge, and does not know the name of her family doctor.    Ambrose Mantle, LCSW 08/20/2011, 2:38 PM

## 2011-08-20 NOTE — Progress Notes (Signed)
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  R/O Acute Delirium.   The patient was seen today and reports the following:   Sleep: The patient reports to having difficulty initiating and maintaining sleep last night.  Appetite: The patient reports a good appetite.   Mild>(1-10) >Severe  Hopelessness (1-10): 0  Depression (1-10): 0  Anxiety (1-10): 0   Suicidal Ideation: The patient adamantly denies any suicidal ideations today.  Plan: No  Intent: No  Means: No   Homicidal Ideation: The patient adamantly denies any homicidal ideations today.  Plan: No  Intent: No.  Means: No   Eye Contact: Good.  General Appearance Vanessa Hoffman: Neat and Clean today and alert.  Motor Behavior: Appropriate.  Speech: Appropriate in rate and volume.  Early understood today.  Mental Status: AO x 3 today.  Level of Consciousness: Alert and oriented x 3..  Mood: Euthymic.  Affect: Mildly Constricted.  Anxiety: None reported.  Thought Process: No confusion noted today.  Thought Content: No auditory or visual hallucinations reported or observed. No delusional thinking reported.  Perception: WNL today.  Judgment: Fair to Good.  Insight: Fair to Good.  Sleep:  Number of Hours: 4.75   Vital Signs:Blood pressure 106/72, pulse 66, temperature 98.2 F (36.8 C), temperature source Oral, resp. rate 18, weight 68.13 kg (150 lb 3.2 oz), SpO2 97.00%.  Lab Results: No results found for this or any previous visit (from the past 48 hour(s)).  CT OF HEAD: IMPRESSION: July 31, 2011  Mild chronic changes in the white matter are stable from prior  study. No acute abnormality. Negative for mass lesion.   MRI RESULTS: (August 06, 2011)  IMPRESSION:  1. No acute intracranial abnormality.  2. Age advanced but nonspecific cerebral white matter signal  changes. Differential considerations include  accelerated/hereditary small vessel  ischemia, sequelae of trauma,  hypercoagulable state, vasculitis, migraines, prior infection or  demyelination.  3. Generalized cerebral volume loss which may be mildly advanced  for age. No disproportionate areas of atrophy are identified.  4. Mild paranasal sinus and right mastoid inflammatory changes.  Imaging Studies continue to be non-specific and suggestive but not diagnostic of a dementing illness.   EEG: According to verbal communication with the Neurologist, the patient's EEG was unremarkable. This Physician however has not seen the study.   Treatment Plan Summary:  1. Daily contact with patient to assess and evaluate symptoms and progress in treatment  2. Medication management  3. The patient will deny suicidal ideations or homicidal ideations for 48 hours prior to discharge and have a depression and anxiety rating of 3 or less. The patient will also deny any auditory or visual hallucinations or delusional thinking or display any manic or hypomanic behaviors.   Plan:  1. Will continue current medications.  2. Continue to monitor.  3. Placement needed.  Randy Readling 08/20/2011, 1:50 PM

## 2011-08-20 NOTE — Progress Notes (Signed)
Pt. Has been pleasant , appreciative & appropriate this PM.Pt. Takes medications as scheduled. Denies SI,HI, & AVH. Continues on 15 minute checks the patient. Safety maintained.

## 2011-08-20 NOTE — Discharge Planning (Signed)
Patient adamant that she will no longer consider going to an assisted living facility or group home, as she has found a friend with whom she can live.  Gave permission to call that friend.  Case Manager spoke with Vanita Panda [phone].  He stated that patient can live with them indefinitely, the household being made up of himself, his daughter, and his 53yo granddaughter.  The daughter stays home with the baby, so there would be ongoing help for patient.  The medications being in the home are not an issue, and they will be willing to oversee patient taking them.  They have been friends for 5-6 years.  He does not foresee drugs or alcohol being an issue for patient at his home.  He is willing and able to take her to doctor's appointments.  He understands the importance of ongoing treatment.  He was aware of her recent going downhill, and when he visited her at home, thought that she was overmedicated.  Although patient's mother has called to state that patient should not go there, she is an adult who has her own guardianship, and is allowed to make this decision on her own.  It appears that this would be an appropriate placement.  Ambrose Mantle, LCSW 08/20/2011, 4:32 PM

## 2011-08-21 MED ORDER — PRAVASTATIN SODIUM 20 MG PO TABS
20.0000 mg | ORAL_TABLET | Freq: Every day | ORAL | Status: DC
  Filled 2011-08-21: qty 1

## 2011-08-21 MED ORDER — PROPRANOLOL HCL 20 MG PO TABS: 20.0000 mg | ORAL_TABLET | ORAL | Status: DC

## 2011-08-21 MED ORDER — VALSARTAN 160 MG PO TABS
160.0000 mg | ORAL_TABLET | Freq: Every day | ORAL | Status: DC
  Filled 2011-08-21 (×2): qty 1

## 2011-08-21 MED ORDER — DONEPEZIL HCL 10 MG PO TABS: 10.0000 mg | ORAL_TABLET | Freq: Every day | ORAL | Status: DC

## 2011-08-21 MED ORDER — SERTRALINE HCL 100 MG PO TABS: 200.0000 mg | ORAL_TABLET | Freq: Every day | ORAL | Status: DC

## 2011-08-21 MED ORDER — PRAVASTATIN SODIUM 20 MG PO TABS: 20.0000 mg | ORAL_TABLET | Freq: Every day | ORAL | Status: DC

## 2011-08-21 MED ORDER — TAB-A-VITE/IRON PO TABS: 1.0000 | ORAL_TABLET | Freq: Every day | ORAL | Status: AC

## 2011-08-21 MED ORDER — ASPIRIN 81 MG PO TBEC: 81.0000 mg | DELAYED_RELEASE_TABLET | Freq: Every day | ORAL | Status: AC

## 2011-08-21 MED ORDER — RISPERIDONE 0.5 MG PO TBDP: 0.5000 mg | ORAL_TABLET | Freq: Every day | ORAL | Status: AC

## 2011-08-21 MED ORDER — TRAZODONE HCL 150 MG PO TABS: 150.0000 mg | ORAL_TABLET | Freq: Every day | ORAL | Status: AC

## 2011-08-21 MED ORDER — VALSARTAN-HYDROCHLOROTHIAZIDE 160-12.5 MG PO TABS: 1.0000 | ORAL_TABLET | Freq: Every day | ORAL | Status: DC
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
## 2011-08-21 NOTE — Progress Notes (Signed)
[facility] Adult Inpatient Family/Significant Other Suicide Prevention Education  Suicide Prevention Education:  Education Completed; Glenetta Hew (daughter of patient's boyfriend) has been identified by the patient as the family member/significant other with whom the patient will be residing, and identified as the person(s) who will aid the patient in the event of a mental health crisis (suicidal ideations/suicide attempt).  With written consent from the patient, the family member/significant other has been provided the following suicide prevention education, prior to the and/or following the discharge of the patient.  The suicide prevention education provided includes the following:  Suicide risk factors  Suicide prevention and interventions  National Suicide Hotline telephone number  [facility] assessment telephone number  [facility] Emergency Assistance 911  [facility] and/or Residential Mobile Crisis Unit telephone number  Request made of family/significant other to:  Remove weapons (e.g., guns, rifles, knives), all items previously/currently identified as safety concern.    Remove drugs/medications (over-the-counter, prescriptions, illicit drugs), all items previously/currently identified as a safety concern.  The family member/significant other verbalizes understanding of the suicide prevention education information provided.  The family member/significant other agrees to remove the items of safety concern listed above. Patient's boyfriend was unable to pick up for discharge due to work. Patient did not want counselor to bother at work. Talked with Angie when she came to get patient for discharge and reviewed suicide prevention information with her and gave pamphlet to share with her father. Angie had no safety concerns and stated there were no weapons in the home. Reviewed information today primarily due to patient's discharge plans changing.  Lulabelle Desta,  Aram Beecham 08/21/2011, 11:53 AM

## 2011-08-21 NOTE — Progress Notes (Signed)
[facility] Case Management Discharge Plan:  Will you be returning to the same living situation after discharge: No.  Has another home will go to. Would you like a referral for services when you are discharged:Yes,  to return to [facility] Do you have access to transportation at discharge:Yes,  can be picked up Do you have the ability to pay for your medications:Yes,  has insurance  Interagency Information:     Patient to Follow up at:  Follow-up Information    Follow up with [facility] on 08/24/2011. (8AM appointment with doctor)    Contact information:   [address] [place] Telephone [phone]         Patient denies SI/HI:   Yes,      Safety Planning and Suicide Prevention discussed:  Yes.  During Aftercare Planning Group, Case Manager provided psychoeducation on "Suicide Prevention Information."  This included descriptions of risk factors for suicide, warning signs that an individual is in crisis and thinking of suicide, and what to do if this occurs.  Pt indicated understanding of information provided, and will read brochure given upon discharge.     Barrier to discharge identified:No.  Summary and Recommendations:  There are no remaining barriers to discharge.   Sarina Ser 08/21/2011, 11:01 AM

## 2011-08-21 NOTE — Progress Notes (Signed)
Suicide Risk Assessment  Discharge Assessment     Demographic factors:  Assessment Details Time of Assessment: Admission Information Obtained From: Patient Current Mental Status:  AO x 3. Risk Reduction Factors:  Risk Reduction Factors: Sense of responsibility to family;Religious beliefs about death;Living with another person, especially a relative  CLINICAL FACTORS:   Depression:   Comorbid alcohol abuse/dependence Alcohol/Substance Abuse/Dependencies  COGNITIVE FEATURES THAT CONTRIBUTE TO RISK:  Mild Cognitive Impairment noted.   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  R/O Acute Delirium.   The patient was seen today and reports the following:   Sleep: The patient reports to sleeping well without difficulty.  Appetite: The patient reports a good appetite.   Mild>(1-10) >Severe  Hopelessness (1-10): 0  Depression (1-10): 0  Anxiety (1-10): 0   Suicidal Ideation: The patient adamantly denies any suicidal ideations today.  Plan: No  Intent: No  Means: No   Homicidal Ideation: The patient adamantly denies any homicidal ideations today.  Plan: No  Intent: No.  Means: No   Eye Contact: Good.  General Appearance Vanessa Hoffman: Neat and Clean today and alert.  Motor Behavior: Appropriate.  Speech: Appropriate in rate and volume. Easily understood today.  Mental Status: AO x 3 today.  Level of Consciousness: Alert and oriented x 3..  Mood: Euthymic.  Affect: Mildly Constricted.  Anxiety: None reported.  Thought Process: No confusion noted today.  Thought Content: No auditory or visual hallucinations reported or observed. No delusional thinking reported.  Perception: WNL today.  Judgment: Fair to Good.  Insight: Fair to Good.   Time was spent discussing with the patient her discharge plans.  The patient states that her boyfriend Vanessa Hoffman November and his daughter and Granddaughter has offered to allow her to live with them.  She  states that "Vanessa Hoffman November" will help monitor her medications and will make sure she is not exposed to alcohol and or drugs.  She has an outpatient follow-up at the [facility] on Monday.   CT OF HEAD: IMPRESSION: July 31, 2011  Mild chronic changes in the white matter are stable from prior  study. No acute abnormality. Negative for mass lesion.   MRI RESULTS: (August 06, 2011)  IMPRESSION:  1. No acute intracranial abnormality.  2. Age advanced but nonspecific cerebral white matter signal  changes. Differential considerations include  accelerated/hereditary small vessel ischemia, sequelae of trauma,  hypercoagulable state, vasculitis, migraines, prior infection or  demyelination.  3. Generalized cerebral volume loss which may be mildly advanced  for age. No disproportionate areas of atrophy are identified.  4. Mild paranasal sinus and right mastoid inflammatory changes.  Imaging Studies continue to be non-specific and suggestive but not diagnostic of a dementing illness.   EEG: According to verbal communication with the Neurologist, the patient's EEG was unremarkable. This Physician however has not seen the study.  Treatment Plan Summary:  1. Daily contact with patient to assess and evaluate symptoms and progress in treatment  2. Medication management  3. The patient will deny suicidal ideations or homicidal ideations for 48 hours prior to discharge and have a depression and anxiety rating of 3 or less. The patient will also deny any auditory or visual hallucinations or delusional thinking or display any manic or hypomanic behaviors.   Plan:  1. Will continue current medications.  2. Continue to monitor.  3. Discharge today to the home of her boyfriend, his daughter and grand-daughter.  SUICIDE RISK:   Minimal: No  identifiable suicidal ideation.  Patients presenting with no risk factors but with morbid ruminations; may be classified as minimal risk based on the severity of the  depressive symptoms  Vanessa Hoffman 08/21/2011, 10:14 AM

## 2011-08-21 NOTE — Progress Notes (Signed)
Patient ID: Vanessa Hoffman, female   DOB: 1956-02-18, 55 y.o.   MRN: [id number] Writer reviewed d/c instructions with pt including medications, follow up appointments and crisis intervention. Pt verbalized understanding of instructions. Pt denies SI/HI and AVH and states that she has no reservations about leaving [facility] at this time. Pt belongings returned from locker and pt is released into her own care.

## 2011-08-21 NOTE — Tx Team (Signed)
Interdisciplinary Treatment Plan Update (Adult)  Date:  08/21/2011  Time Reviewed:  10:47 AM   Progress in Treatment: Attending groups:  Yes Participating in groups:    Yes, fully engaged Taking medication as prescribed:  Yes, no refusals   Tolerating medication:   Yes, no side effects reported or noted Family/Significant other contact made:  Yes, multiple Patient understands diagnosis:   Yes Discussing patient identified problems/goals with staff:   Yes Medical problems stabilized or resolved:   Yes Denies suicidal/homicidal ideation:  Yes Issues/concerns per patient self-inventory:   None Other:  New problem(s) identified: No, Describe:    Reason for Continuation of Hospitalization: None  Interventions implemented related to continuation of hospitalization:  N/A  Additional comments:  Not applicable  Estimated length of stay:  Discharge today  Discharge Plan:  Will be picked up by friend's daughter, will live with boyfriend and his daughter - will follow up at [facility]  New goal(s):  Not applicable  Review of initial/current patient goals per problem list:   1.  Goal(s):  Find appropriate placement for discharge  Met:  Yes  Target date:  By Discharge   As evidenced by:  Has a home to live in with BF now  2.  Goal(s):  Decrease depression from 10 to 3  Met:  Yes  Target date:  By Discharge   As evidenced by:  Denies depression  3.  Goal(s):  Reduce psychosis to baseline  Met:  Yes  Target date:  By Discharge   As evidenced by:  Appears to be at baseline  4.  Goal(s):  Reduce confusion  Met:  Yes  Target date:  By Discharge   As evidenced by:  Appears to be well organized in thinking now  Attendees: Patient:  Vanessa Hoffman  08/21/2011 10:55 AM   Family:     Physician:  Dr. Harvie Heck Readling 08/21/2011 10:55 AM   Nursing:    08/21/2011   10:55 AM   Case Manager:  Ambrose Mantle, LCSW 08/21/2011  10:55 AM   Counselor:  Veto Kemps,  MT-BC 08/21/2011  10:55 AM   Other:      Other:      Other:      Other:       Scribe for Treatment Team:   Sarina Ser, 08/21/2011, 10:47 AM

## 2011-08-21 NOTE — Progress Notes (Signed)
Pt is pleasant and cooperative  Her thinking is logical and she interacts well with others  Verbal support given  Medications administered and effectiveness monitored   Q 15 min checks   Pt safe at present

## 2011-08-24 NOTE — Progress Notes (Signed)
Patient Discharge Instructions:  Admission Note Faxed,  08/24/2011 After Visit Summary Faxed,  08/24/2011 Faxed to the [facility] Care provider:  08/24/2011 Facesheet faxed 08/24/2011  Faxed to [facility] @ [phone]  Wandra Scot, 08/24/2011, 11:51 AM

## 2011-09-07 NOTE — Discharge Summary (Signed)
  Identifying information: This is a 56 year old Caucasian female, single, this was an involuntary admission  Date of admission: 07/28/2011 Date of discharge: 08/21/2011  Discharge plan: Vanessa Hoffman will followup at the [facility] on December 31 for medication and counseling.  Discharge diagnoses: Axis I: Maj. depression, recurrent, severe, with psychotic features, stabilized. History of alcohol abuse.  R/O Acute Delirium, resolved. Axis II: No diagnosis Axis III: Degenerative disc disease, arthritis, hypertension, fibromyalgia. Axis IV: Deferred, stable home as an asset Axis V: Current 57 past year not known  Discharge medications: Aspirin 81 mg by mouth daily, for heart health Aricept 10 mg daily, for clear thinking. Zoloft 200 mg daily, for depression. Trazodone 150 mg at bedtime, for insomnia Multivitamin with iron, one tab daily, vitamin supplement. Diovan/HCTZ 160 mg/12.5 mg 1 tab daily, for hypertension. Inderal 20 mg twice daily for blood pressure. Risperdal 0.5 mg twice daily, for clear thoughts. Pravachol 20 mg daily, for cholesterol. Vitamin D 1000 units daily, her vitamin D supplement.  Course of hospitalization:  Vanessa Hoffman was admitted by way of our emergency room where she initially presented with disorganized behavior and some delusional thinking. She was admitted to our acute stabilization unit and given a provisional diagnosis of Maj. depression with psychosis versus acute delirium. She was initially very disorganized with intrusive behaviors and agitation, and pacing most of the night. She was placed on one-to-one observation for time, until she could be clear not respect boundaries.  Initially a routine medications were restarted and Zoloft was increased 250 mg daily to address her depressive symptoms. She had initially said that she had no alcohol in the previous 3 weeks but her story very considerably. She began to appear in a state of delirium medications were  gradually decreased with corresponding improvement in her mental status. Elavil, Wellbutrin, and Depakote, which had been restarted premenstrually decrease completely. She was started on Risperdal 2 mg by mouth each bedtime which was eventually titrated to 4 mg each bedtime, and subsequently decreased to 0.5 mg after her delirium cleared.  In the course of evaluating her delirium she received a neuro consult which included an EEG that was unremarkable, and recommendations for no further workup. MRI was completed on 08/06/2011 and revealed age advanced nonspecific white matter changes but no acute findings. CT scan was completed which showed stable white matter changes compared to previous readings. Diagnostic studies included a vitamin D level was shown to be decreased in vitamin D supplements were started. TSH, B12 level, RPR, and erythrocyte sedimentation level, were all found to be normal.  Her case manager worked with her family who refused to allow her to come home. Ultimately her boyfriend was willing to take her home with him to live with him and his daughter. Around December 21 Vanessa Hoffman's mental status began to clear, and she no longer required one-to-one observation. At December 23 she was in full contact with reality with no dangerous ideas and thinking becoming more logical. She was able to appreciate her family situation and disappointed she cannot return to her parents home. By the 28th she was thinking logically with greatly improved insight and ready for discharge.

## 2012-03-15 ENCOUNTER — Other Ambulatory Visit: Payer: Self-pay | Admitting: Physical Medicine & Rehabilitation

## 2015-03-02 ENCOUNTER — Encounter (HOSPITAL_COMMUNITY): Payer: Self-pay | Admitting: General Practice

## 2015-03-02 ENCOUNTER — Inpatient Hospital Stay (HOSPITAL_COMMUNITY)
Admission: EM | Admit: 2015-03-02 | Discharge: 2015-03-05 | DRG: 086 | Disposition: A | Discharge status: Skilled Nursing Facility | Payer: Medicare Other | Attending: Internal Medicine | Admitting: Internal Medicine

## 2015-03-02 ENCOUNTER — Emergency Department (HOSPITAL_COMMUNITY): Payer: Medicare Other

## 2015-03-02 DIAGNOSIS — R296 Repeated falls: Secondary | ICD-10-CM | POA: Diagnosis present

## 2015-03-02 DIAGNOSIS — K709 Alcoholic liver disease, unspecified: Secondary | ICD-10-CM | POA: Diagnosis present

## 2015-03-02 DIAGNOSIS — D539 Nutritional anemia, unspecified: Secondary | ICD-10-CM | POA: Diagnosis present

## 2015-03-02 DIAGNOSIS — F333 Major depressive disorder, recurrent, severe with psychotic symptoms: Secondary | ICD-10-CM | POA: Diagnosis present

## 2015-03-02 DIAGNOSIS — K7689 Other specified diseases of liver: Secondary | ICD-10-CM | POA: Diagnosis present

## 2015-03-02 DIAGNOSIS — D72829 Elevated white blood cell count, unspecified: Secondary | ICD-10-CM | POA: Diagnosis present

## 2015-03-02 DIAGNOSIS — M797 Fibromyalgia: Secondary | ICD-10-CM | POA: Diagnosis present

## 2015-03-02 DIAGNOSIS — M1990 Unspecified osteoarthritis, unspecified site: Secondary | ICD-10-CM | POA: Diagnosis present

## 2015-03-02 DIAGNOSIS — N179 Acute kidney failure, unspecified: Secondary | ICD-10-CM | POA: Diagnosis present

## 2015-03-02 DIAGNOSIS — M810 Age-related osteoporosis without current pathological fracture: Secondary | ICD-10-CM | POA: Diagnosis present

## 2015-03-02 DIAGNOSIS — E876 Hypokalemia: Secondary | ICD-10-CM | POA: Diagnosis present

## 2015-03-02 DIAGNOSIS — F17210 Nicotine dependence, cigarettes, uncomplicated: Secondary | ICD-10-CM | POA: Diagnosis present

## 2015-03-02 DIAGNOSIS — Z9181 History of falling: Secondary | ICD-10-CM | POA: Diagnosis not present

## 2015-03-02 DIAGNOSIS — R55 Syncope and collapse: Secondary | ICD-10-CM | POA: Diagnosis not present

## 2015-03-02 DIAGNOSIS — W19XXXA Unspecified fall, initial encounter: Secondary | ICD-10-CM | POA: Diagnosis present

## 2015-03-02 DIAGNOSIS — D696 Thrombocytopenia, unspecified: Secondary | ICD-10-CM | POA: Diagnosis present

## 2015-03-02 DIAGNOSIS — S020XXA Fracture of vault of skull, initial encounter for closed fracture: Principal | ICD-10-CM | POA: Diagnosis present

## 2015-03-02 DIAGNOSIS — I10 Essential (primary) hypertension: Secondary | ICD-10-CM | POA: Diagnosis present

## 2015-03-02 DIAGNOSIS — R51 Headache: Secondary | ICD-10-CM | POA: Diagnosis present

## 2015-03-02 DIAGNOSIS — I951 Orthostatic hypotension: Secondary | ICD-10-CM | POA: Diagnosis present

## 2015-03-02 DIAGNOSIS — Z79899 Other long term (current) drug therapy: Secondary | ICD-10-CM | POA: Diagnosis not present

## 2015-03-02 DIAGNOSIS — R7309 Other abnormal glucose: Secondary | ICD-10-CM | POA: Diagnosis present

## 2015-03-02 DIAGNOSIS — S0291XA Unspecified fracture of skull, initial encounter for closed fracture: Secondary | ICD-10-CM

## 2015-03-02 DIAGNOSIS — S06300A Unspecified focal traumatic brain injury without loss of consciousness, initial encounter: Secondary | ICD-10-CM | POA: Diagnosis not present

## 2015-03-02 LAB — URINALYSIS, ROUTINE W REFLEX MICROSCOPIC
Bilirubin Urine: NEGATIVE
Glucose, UA: NEGATIVE mg/dL
Hgb urine dipstick: NEGATIVE
KETONES UR: NEGATIVE mg/dL
LEUKOCYTES UA: NEGATIVE
Nitrite: NEGATIVE
PH: 7 (ref 5.0–8.0)
PROTEIN: NEGATIVE mg/dL
Specific Gravity, Urine: 1.012 (ref 1.005–1.030)
Urobilinogen, UA: 1 mg/dL (ref 0.0–1.0)

## 2015-03-02 LAB — TROPONIN I

## 2015-03-02 LAB — BASIC METABOLIC PANEL
Anion gap: 12 (ref 5–15)
BUN: 12 mg/dL (ref 6–20)
CALCIUM: 8.8 mg/dL — AB (ref 8.9–10.3)
CO2: 25 mmol/L (ref 22–32)
CREATININE: 1.28 mg/dL — AB (ref 0.44–1.00)
Chloride: 99 mmol/L — ABNORMAL LOW (ref 101–111)
GFR, EST AFRICAN AMERICAN: 53 mL/min — AB (ref >60)
GFR, EST NON AFRICAN AMERICAN: 45 mL/min — AB (ref >60)
Glucose, Bld: 135 mg/dL — ABNORMAL HIGH (ref 65–99)
Potassium: 3.1 mmol/L — ABNORMAL LOW (ref 3.5–5.1)
Sodium: 136 mmol/L (ref 135–145)

## 2015-03-02 LAB — I-STAT CHEM 8, ED
BUN: 12 mg/dL (ref 6–20)
CALCIUM ION: 1.11 mmol/L — AB (ref 1.12–1.23)
Chloride: 98 mmol/L — ABNORMAL LOW (ref 101–111)
Creatinine, Ser: 1.2 mg/dL — ABNORMAL HIGH (ref 0.44–1.00)
Glucose, Bld: 130 mg/dL — ABNORMAL HIGH (ref 65–99)
HCT: 38 % (ref 36.0–46.0)
Hemoglobin: 12.9 g/dL (ref 12.0–15.0)
Potassium: 3 mmol/L — ABNORMAL LOW (ref 3.5–5.1)
Sodium: 137 mmol/L (ref 135–145)
TCO2: 22 mmol/L (ref 0–100)

## 2015-03-02 LAB — RAPID URINE DRUG SCREEN, HOSP PERFORMED
Amphetamines: NOT DETECTED
BARBITURATES: NOT DETECTED
Benzodiazepines: NOT DETECTED
Cocaine: NOT DETECTED
Opiates: NOT DETECTED
Tetrahydrocannabinol: NOT DETECTED

## 2015-03-02 LAB — CBC
HEMATOCRIT: 33.4 % — AB (ref 36.0–46.0)
Hemoglobin: 11.2 g/dL — ABNORMAL LOW (ref 12.0–15.0)
MCH: 33.4 pg (ref 26.0–34.0)
MCHC: 33.5 g/dL (ref 30.0–36.0)
MCV: 99.7 fL (ref 78.0–100.0)
Platelets: 144 10*3/uL — ABNORMAL LOW (ref 150–400)
RBC: 3.35 MIL/uL — ABNORMAL LOW (ref 3.87–5.11)
RDW: 14.8 % (ref 11.5–15.5)
WBC: 11.3 10*3/uL — AB (ref 4.0–10.5)

## 2015-03-02 LAB — CBG MONITORING, ED: GLUCOSE-CAPILLARY: 117 mg/dL — AB (ref 65–99)

## 2015-03-02 MED ORDER — ACETAMINOPHEN 500 MG PO TABS
1000.0000 mg | ORAL_TABLET | Freq: Four times a day (QID) | ORAL | Status: DC | PRN
  Administered 2015-03-03 (×2): 1000 mg via ORAL
  Filled 2015-03-02 (×3): qty 2

## 2015-03-02 MED ORDER — ENOXAPARIN SODIUM 40 MG/0.4ML ~~LOC~~ SOLN
40.0000 mg | SUBCUTANEOUS | Status: DC
  Administered 2015-03-02 – 2015-03-04 (×3): 40 mg via SUBCUTANEOUS
  Filled 2015-03-02 (×3): qty 0.4

## 2015-03-02 MED ORDER — HYDROCODONE-ACETAMINOPHEN 5-325 MG PO TABS: 1.0000 | ORAL_TABLET | Freq: Four times a day (QID) | ORAL | Status: DC | PRN

## 2015-03-02 MED ORDER — HYDROCODONE-ACETAMINOPHEN 5-325 MG PO TABS
1.0000 | ORAL_TABLET | Freq: Two times a day (BID) | ORAL | Status: DC | PRN
  Administered 2015-03-02 – 2015-03-05 (×6): 1 via ORAL
  Filled 2015-03-02 (×6): qty 1

## 2015-03-02 MED ORDER — SODIUM CHLORIDE 0.9 % IV SOLN
INTRAVENOUS | Status: AC
  Administered 2015-03-02 – 2015-03-03 (×2): via INTRAVENOUS

## 2015-03-02 MED ORDER — POTASSIUM CHLORIDE CRYS ER 20 MEQ PO TBCR
40.0000 meq | EXTENDED_RELEASE_TABLET | Freq: Once | ORAL | Status: AC
  Administered 2015-03-02: 40 meq via ORAL
  Filled 2015-03-02: qty 2

## 2015-03-02 MED ORDER — SERTRALINE HCL 100 MG PO TABS
200.0000 mg | ORAL_TABLET | Freq: Every day | ORAL | Status: DC
  Administered 2015-03-03 – 2015-03-05 (×3): 200 mg via ORAL
  Filled 2015-03-02 (×3): qty 2

## 2015-03-02 MED ORDER — SODIUM CHLORIDE 0.9 % IV BOLUS (SEPSIS)
1000.0000 mL | Freq: Once | INTRAVENOUS | Status: AC
Start: 2015-03-02 — End: 2015-03-02
  Administered 2015-03-02: 1000 mL via INTRAVENOUS

## 2015-03-02 MED ORDER — ACETAMINOPHEN 500 MG PO TABS: 1000.0000 mg | ORAL_TABLET | Freq: Four times a day (QID) | ORAL | Status: DC | PRN

## 2015-03-02 MED ORDER — ACETAMINOPHEN 325 MG PO TABS: 650.0000 mg | ORAL_TABLET | Freq: Four times a day (QID) | ORAL | Status: DC | PRN

## 2015-03-02 MED ORDER — ACETAMINOPHEN 500 MG PO TABS
1000.0000 mg | ORAL_TABLET | Freq: Four times a day (QID) | ORAL | Status: DC | PRN
  Administered 2015-03-02: 1000 mg via ORAL
  Filled 2015-03-02: qty 2

## 2015-03-02 MED ORDER — SODIUM CHLORIDE 0.9 % IJ SOLN
3.0000 mL | Freq: Two times a day (BID) | INTRAMUSCULAR | Status: DC
  Administered 2015-03-02 – 2015-03-04 (×4): 3 mL via INTRAVENOUS

## 2015-03-02 MED ORDER — PRAVASTATIN SODIUM 20 MG PO TABS
20.0000 mg | ORAL_TABLET | Freq: Every day | ORAL | Status: DC
  Administered 2015-03-02 – 2015-03-04 (×3): 20 mg via ORAL
  Filled 2015-03-02 (×3): qty 1

## 2015-03-02 MED ORDER — SODIUM CHLORIDE 0.9 % IV SOLN: INTRAVENOUS | Status: DC

## 2015-03-02 NOTE — Progress Notes (Signed)
Called to get report on patient but not successful at this time.

## 2015-03-02 NOTE — Progress Notes (Signed)
Patient admitted from ER. Patient alert and oriented x 4. Patient oriented to room amd made comfortable. Will continue to monitor.

## 2015-03-02 NOTE — ED Notes (Signed)
Pt transported to CT and Xray. 

## 2015-03-02 NOTE — H&P (Signed)
Date: 03/02/2015               Patient Name:  Vanessa Hoffman MRN: [id number]  DOB: 1955/12/14 Age / Sex: 59 y.o., female   PCP: No primary care provider on file.         Medical Service: Internal Medicine Teaching Service         Attending Physician: Dr. Earl Lagos, MD    First Contact: Dr. Isabella Bowens Pager: [phone]  Second Contact: Dr. Yetta Barre Pager: [phone]       After Hours (After 5p/  First Contact Pager: [phone]  weekends / holidays): Second Contact Pager: [phone]   Chief Complaint: Fall  History of Present Illness: [name] is a 59 year old woman with history of hypertension, fibromyalgia, depression/anxiety presenting after fall. She fell 1.5 days ago. She was walking to her car when she felt her legs collapse. It was sudden. Denies LOC. Denies paresthesias. She feels that her left leg may have been weaker at the time. Denies pain prior or during the collapse. She has chronic knee arthralgias. After falling she [place] is unable to get up on her own and has to crawl. It takes about an hour for her to regain her baseline strength.  She has been having frequent falls for the past 2 years. She falls about 4 times a week. She normally walks with a rolling walker or cane. She occasionally feels dizzy prior to her falls. She has a history of alcohol abuse but denies heavy drinking in the past month. She denies illicit drugs.  No fevers or chills, change in her occasional dry cough, shortness of breath, chest pain, abdominal pain, diarrhea, hematochezia, melena, dysuria, hematuria, rash, edema. Some blurry vision, nausea and vomiting after the fall. Denies history of VTE.  Meds: Current Facility-Administered Medications  Medication Dose Route Frequency Provider Last Rate Last Dose  . 0.9 %  sodium chloride infusion   Intravenous STAT Glynn Octave, MD      . sodium chloride 0.9 % bolus 1,000 mL  1,000 mL Intravenous Once Glynn Octave, MD 500 mL/hr at 03/02/15 1311  1,000 mL at 03/02/15 1311   Current Outpatient Prescriptions  Medication Sig Dispense Refill  . amitriptyline (ELAVIL) 50 MG tablet Take 50 mg by mouth 2 (two) times daily.    . sertraline (ZOLOFT) 100 MG tablet Take 200 mg by mouth daily.    Marland Kitchen donepezil (ARICEPT) 10 MG tablet Take 1 tablet (10 mg total) by mouth at bedtime. For clear thinking. 30 tablet 0  . Multiple Vitamins-Iron (MULTIVITAMINS WITH IRON) TABS Take 1 tablet by mouth daily. Vitamin supplement. (Patient not taking: Reported on 03/02/2015) 30 tablet 0  . pravastatin (PRAVACHOL) 20 MG tablet Take 1 tablet (20 mg total) by mouth at bedtime. For cholesterol management. (Patient not taking: Reported on 03/02/2015) 30 tablet 0  . propranolol (INDERAL) 20 MG tablet Take 20 mg by mouth 2 (two) times daily.      . propranolol (INDERAL) 20 MG tablet Take 1 tablet (20 mg total) by mouth 2 (two) times daily in the am and at bedtime.. For anxiety and also for blood pressure. (Patient not taking: Reported on 03/02/2015) 60 tablet 0  . sertraline (ZOLOFT) 100 MG tablet Take 2 tablets (200 mg total) by mouth daily. For depression. (Patient not taking: Reported on 03/02/2015) 60 tablet 0  . valsartan-hydrochlorothiazide (DIOVAN-HCT) 160-12.5 MG per tablet Take 1 tablet by mouth daily. For hypertension. (Patient not taking: Reported on 03/02/2015) 30  tablet 0    Allergies: Allergies as of 03/02/2015  . (No Known Allergies)   Past Medical History  Diagnosis Date  . Anxiety   . Arthritis   . Depression   . Hypertension   . Fibromyalgia   . DDD (degenerative disc disease)   . Osteoporosis    Past Surgical History  Procedure Laterality Date  . Cervical disc surgery  2001    3 fusions  . Cesarean section      2   No family history on file. History   Social History  . Marital Status: Married    Spouse Name: N/A  . Number of Children: N/A  . Years of Education: N/A   Occupational History  . Not on file.   Social History Main Topics  .  Smoking status: Current Every Day Smoker -- 0.05 packs/day for 20 years    Types: Cigarettes  . Smokeless tobacco: Never Used  . Alcohol Use: No  . Drug Use: No     Comment: Smoked marijuana some until she was in her 64's  . Sexual Activity: No   Other Topics Concern  . Not on file   Social History Narrative    Review of Systems: Constitutional: no fevers/chills Eyes: + vision changes Ears, nose, mouth, throat, and face: + Chronic intermittent dry cough Respiratory: no shortness of breath Cardiovascular: no chest pain Gastrointestinal: + nausea/vomiting, no abdominal pain, no constipation, no diarrhea Genitourinary: no dysuria, no hematuria Integument: no rash Hematologic/lymphatic: no bleeding/bruising, no edema Musculoskeletal: + chronic arthralgias, + chronic myalgias Neurological: no paresthesias, + weakness  Physical Exam: Blood pressure 103/30, pulse 107, temperature 98.4 F (36.9 C), temperature source Oral, resp. rate 25, height 5\' 3"  (1.6 m), SpO2 100 %. General Apperance: NAD Head: Posterior scalp hematoma Eyes: PERRL, anicteric sclera Ears: Normal external ear canal Nose: Nares normal, septum midline, mucosa normal Throat: Lips, mucosa and tongue normal  Neck: Supple, trachea midline Back: No tenderness or bony abnormality  Lungs: Clear to auscultation bilaterally. No wheezes, rhonchi or rales. Breathing comfortably Chest Wall: Nontender, no deformity Heart: Regular rate and rhythm, no murmur/rub/gallop Abdomen: Soft, nontender, nondistended, no rebound/guarding Extremities: Normal, atraumatic, warm and well perfused, no edema Pulses: 2+ throughout Skin: Scattered ecchymosis Neurologic: Alert and oriented x 3. Speech fluent without evidence of aphasia. EOMI but brings eyes back to midline when looking to the right upper and left upper fields. CNII-XII otherwise intact. Normal strength and sensation. No pronator drift.  Lab results: Basic Metabolic  Panel:  Recent Labs  03/02/15 1213 03/02/15 1224  NA 136 137  K 3.1* 3.0*  CL 99* 98*  CO2 25  --   GLUCOSE 135* 130*  BUN 12 12  CREATININE 1.28* 1.20*  CALCIUM 8.8*  --    CBC:  Recent Labs  03/02/15 1213 03/02/15 1224  WBC 11.3*  --   HGB 11.2* 12.9  HCT 33.4* 38.0  MCV 99.7  --   PLT 144*  --    Cardiac Enzymes:  Recent Labs  03/02/15 1213  TROPONINI <0.03   CBG:  Recent Labs  03/02/15 1211  GLUCAP 117*   Imaging results:  Dg Thoracic Spine 2 View  03/02/2015   CLINICAL DATA:  Fall yesterday with thoracic pain. Initial encounter.  EXAM: THORACIC SPINE - 2-3 VIEWS  COMPARISON:  04/04/2010 and prior chest radiograph  FINDINGS: There is no evidence of acute fracture or subluxation.  Mild degenerative disc disease at T7-T8 again noted.  No focal  bony lesions are identified.  Anterior fusion changes in the lower cervical spine are again identified.  IMPRESSION: No acute abnormalities.   Electronically Signed   By: Harmon PierJeffrey  Hu M.D.   On: 03/02/2015 14:05   Dg Lumbar Spine Complete  03/02/2015   CLINICAL DATA:  Patient status post fall 2 days prior. Back pain. Initial encounter.  EXAM: LUMBAR SPINE - COMPLETE 4+ VIEW  COMPARISON:  None.  FINDINGS: Normal anatomic alignment. Mild anterior height loss of the L1 vertebral body. L1-2 degenerative disc disease with anterior endplate osteophytosis. Lower lumbar spine facet degenerative changes. The SI joints are unremarkable. Pelvic phleboliths. T11-T12 degenerative disc disease.  IMPRESSION: Mild anterior height loss of the L1 vertebral body, which appears to have been present on MR 01/22/2007. Recommend correlation for point tenderness.  L1-2 degenerative disc disease.   Electronically Signed   By: Annia Beltrew  Davis M.D.   On: 03/02/2015 14:10   Ct Head Wo Contrast  03/02/2015   CLINICAL DATA:  Patient status post fall 2 days prior. Patient hit the back of her head. No loss of consciousness.  EXAM: CT HEAD WITHOUT CONTRAST  CT  CERVICAL SPINE WITHOUT CONTRAST  TECHNIQUE: Multidetector CT imaging of the head and cervical spine was performed following the standard protocol without intravenous contrast. Multiplanar CT image reconstructions of the cervical spine were also generated.  COMPARISON:  Brain CT 07/31/2011  FINDINGS: CT HEAD FINDINGS  Ventricles and sulci are appropriate for patient's age. Overlying the right cerebral convexity there is isodense fluid measuring up to 5 mm. There is approximately 2 mm of right to left midline shift. No evidence for acute cortically based infarct. Periventricular and subcortical white matter hypodensity compatible with chronic small vessel ischemic changes. Orbits are unremarkable. Mucosal thickening left maxillary sinus. Remainder the paranasal sinuses are unremarkable. Mastoid air cells are well aerated. There is a nondisplaced fracture through the posterior left calvarium extending to the skullbase, possibly involving the left occipital condyle.  CT CERVICAL SPINE FINDINGS  Patient status post anterior plate and screw fixation with fusion of C5, C6 and C7 vertebral bodies. There is degenerative disc disease with mild retrolisthesis of C4 on C5. Bulky anterior osteophytes at C4 level. Craniocervical junction is intact. Focal kyphosis C4-5 level. Lung apices are clear. Visualized thyroid is unremarkable.  IMPRESSION: Nondisplaced fracture through posterior left calvarium extending to the skullbase, possibly extending to left occipital condyle.  Isodense fluid measuring up to 5 mm overlying the right cerebral hemisphere with mild right to left midline shift. This may represent a chronic subdural hematoma or potentially an acute hygroma. This collection was not present on prior CT 12/17/2007. Recommend correlation for interval history.  Anterior cervical spinal fusion hardware from the C5-C7 levels with associated C4-5 degenerative disc disease and focal kyphosis at this level. If there is concern for  ligamentous injury at this location consider MRI.  No definite evidence for acute cervical spine fracture.  Chronic small vessel ischemic change.  Critical Value/emergent results were called by telephone at the time of interpretation on 03/02/2015 at 1:25 pm to Dr. Glynn OctaveSTEPHEN RANCOUR , who verbally acknowledged these results.   Electronically Signed   By: Annia Beltrew  Davis M.D.   On: 03/02/2015 13:38   Ct Cervical Spine Wo Contrast  03/02/2015   CLINICAL DATA:  Patient status post fall 2 days prior. Patient hit the back of her head. No loss of consciousness.  EXAM: CT HEAD WITHOUT CONTRAST  CT CERVICAL SPINE WITHOUT CONTRAST  TECHNIQUE: Multidetector  CT imaging of the head and cervical spine was performed following the standard protocol without intravenous contrast. Multiplanar CT image reconstructions of the cervical spine were also generated.  COMPARISON:  Brain CT 07/31/2011  FINDINGS: CT HEAD FINDINGS  Ventricles and sulci are appropriate for patient's age. Overlying the right cerebral convexity there is isodense fluid measuring up to 5 mm. There is approximately 2 mm of right to left midline shift. No evidence for acute cortically based infarct. Periventricular and subcortical white matter hypodensity compatible with chronic small vessel ischemic changes. Orbits are unremarkable. Mucosal thickening left maxillary sinus. Remainder the paranasal sinuses are unremarkable. Mastoid air cells are well aerated. There is a nondisplaced fracture through the posterior left calvarium extending to the skullbase, possibly involving the left occipital condyle.  CT CERVICAL SPINE FINDINGS  Patient status post anterior plate and screw fixation with fusion of C5, C6 and C7 vertebral bodies. There is degenerative disc disease with mild retrolisthesis of C4 on C5. Bulky anterior osteophytes at C4 level. Craniocervical junction is intact. Focal kyphosis C4-5 level. Lung apices are clear. Visualized thyroid is unremarkable.  IMPRESSION:  Nondisplaced fracture through posterior left calvarium extending to the skullbase, possibly extending to left occipital condyle.  Isodense fluid measuring up to 5 mm overlying the right cerebral hemisphere with mild right to left midline shift. This may represent a chronic subdural hematoma or potentially an acute hygroma. This collection was not present on prior CT 12/17/2007. Recommend correlation for interval history.  Anterior cervical spinal fusion hardware from the C5-C7 levels with associated C4-5 degenerative disc disease and focal kyphosis at this level. If there is concern for ligamentous injury at this location consider MRI.  No definite evidence for acute cervical spine fracture.  Chronic small vessel ischemic change.  Critical Value/emergent results were called by telephone at the time of interpretation on 03/02/2015 at 1:25 pm to Dr. Glynn Octave , who verbally acknowledged these results.   Electronically Signed   By: Annia Belt M.D.   On: 03/02/2015 13:38    Other results: EKG: normal sinus rhythm, T wave inversions in V2, QTc 503 ms, no previous EKG for comparison  Assessment & Plan by Problem: Active Problems:   Skull fracture, non depressed  Frequent falls: history of alcohol abuse but denies recent heavy alcohol use. She reports problems with gait as her legs become acutely weak prior to her falls. Differential includes gait/balance impairment, vestibular dysfunction, vision impairment, medical illness, orthostatic hypotension, medications. T wave inversions in V2 seen on EKG but she denies chest pain and initial troponin negative. Low risk and moderate risk for PE by Wells and Modified Geneva score respectively. Also unlikely to have PE as she denies dyspnea and is not hypoxic. Positive orthostatics - BP 130/79 lying, 126/72 sitting, and 103/30 standing. No acute abnormalities on XR thoracic spine. XR lumbar spine with previously seen mild anterior height loss of the L1 vertebral body  in L1-2 degenerative disc disease. CT findings discussed below. -PT/OT with vestibular evaluation by PT -Repeat EKG in AM -Echo -NS @ 125 ml/hr -Hold home amitriptyline 50mg  BID and donepezil 10mg  QHS. -Obtain TSH, Hgb A1c, UDS -Telemetry -Repeat orthostatic vital signs in AM  Skull fracture: CT head/c-spine with nondisplaced fracture through posterior left calvarium extending to the skull base and isodense fluid overlying the right cerebral hemisphere with mild right to left midline shift. Findings discussed with Dr. Mikal Plane by ED. He does not recommend neurosurgical intervention. -Tylenol 1000mg  q 6 hr prn moderate  pain x 3 doses -Norco 5/325mg  q 12 hr prn severe pain -Will limit acetaminophen to less than 4g in 24 hours -Neurochecks q4hr  AKI: Creatinine on admission 1.28. Her baseline is around 0.9. Likely prerenal. -IV fluids as above  Leukocytosis: likely 2/2 skull fracture. Afebrile. -Continue to monitor.  Thrombocytopenia: Platelets 144 on admission. Previous platelet 142 08/13/2011. May be secondary to alcohol abuse history. -Continue to monitor.  Hypertension: Not on any medications at home. Normotensive in ED. -Continue to monitor.  Major depression with history of psychosis:  -Hold home amitriptyline  BID and donepezil  QHS. -Continue home sertraline  daily  FEN: General diet  VTE ppx: Lovenox  Dispo: Disposition is deferred at this time, awaiting improvement of current medical problems. Anticipated discharge in approximately 1-2 day(s).   The patient does have a current PCP (No primary care provider on file.) and does not need an [facility] hospital follow-up appointment after discharge.  The patient does not know have transportation limitations that hinder transportation to clinic appointments.  Signed: Lora Paula, MD 03/02/2015, 2:33 PM

## 2015-03-02 NOTE — ED Notes (Signed)
Pt brought in via [facility] with complaints of a fall 2 days ago, landing on her back. Pt denies loss of consciousness, and neck or back pain. Pt is A/O. Pt reporting a headache, rating pain a 8/10. Pt presents with 4 bruises to her back and bruising down both legs. Pt uses a walker and cane at home to ambulate at home.

## 2015-03-02 NOTE — ED Provider Notes (Signed)
CSN: [id number]     Arrival date & time 03/02/15  1158 History   First MD Initiated Contact with Patient 03/02/15 1222     Chief Complaint  Patient presents with  . Fall     (Consider location/radiation/quality/duration/timing/severity/associated sxs/prior Treatment) HPI Comments: Patient complains of head pain after a fall 2 days ago. She states she has recurrent falls for unknown reasons in her legs become weak. She states she falls about 4 times a week. She complains of pain in the back of her head and neck. Denies losing consciousness. Patient is alert and oriented. Complains of a headache that is diffuse. Nothing makes it better or worse. No nausea or vomiting. Patient not on any anticoagulation. No focal weakness, numbness or tingling. She reports no previous workup for her recurrent falls. She denies any prodrome of dizziness or lightheadedness. No chest pain or shortness of breath. Her medical history includes depression, anxiety, fibromyalgia. She states her husband who she normally lives with is admitted to the hospital with a hemorrhagic stroke.   The history is provided by the patient.    Past Medical History  Diagnosis Date  . Anxiety   . Arthritis   . Depression   . Hypertension   . Fibromyalgia   . DDD (degenerative disc disease)   . Osteoporosis    Past Surgical History  Procedure Laterality Date  . Cervical disc surgery  2001    3 fusions  . Cesarean section      2   No family history on file. History  Substance Use Topics  . Smoking status: Current Every Day Smoker -- 0.05 packs/day for 20 years    Types: Cigarettes  . Smokeless tobacco: Never Used  . Alcohol Use: No   OB History    No data available     Review of Systems  Constitutional: Negative for fever, activity change and appetite change.  HENT: Negative for congestion and rhinorrhea.   Respiratory: Negative for cough, chest tightness and shortness of breath.   Cardiovascular: Negative for chest  pain.  Gastrointestinal: Negative for abdominal pain.  Genitourinary: Negative for dysuria and hematuria.  Musculoskeletal: Positive for myalgias, back pain, arthralgias and neck pain.  Skin: Negative for wound.  Neurological: Positive for weakness and headaches. Negative for dizziness.  A complete 10 system review of systems was obtained and all systems are negative except as noted in the HPI and PMH.      Allergies  Review of patient's allergies indicates no known allergies.  Home Medications   Prior to Admission medications   Medication Sig Start Date End Date Taking? Authorizing Provider  amitriptyline (ELAVIL) 50 MG tablet Take 50 mg by mouth 2 (two) times daily.   Yes Historical Provider, MD  sertraline (ZOLOFT) 100 MG tablet Take 200 mg by mouth daily.   Yes Historical Provider, MD  donepezil (ARICEPT) 10 MG tablet Take 1 tablet (10 mg total) by mouth at bedtime. For clear thinking. 08/21/11 08/20/12  Viviann Spare, FNP  Multiple Vitamins-Iron (MULTIVITAMINS WITH IRON) TABS Take 1 tablet by mouth daily. Vitamin supplement. Patient not taking: Reported on 03/02/2015 08/21/11   Viviann Spare, FNP  pravastatin (PRAVACHOL) 20 MG tablet Take 1 tablet (20 mg total) by mouth at bedtime. For cholesterol management. Patient not taking: Reported on 03/02/2015 08/21/11   Viviann Spare, FNP  propranolol (INDERAL) 20 MG tablet Take 20 mg by mouth 2 (two) times daily.      Historical Provider, MD  propranolol (INDERAL) 20 MG tablet Take 1 tablet (20 mg total) by mouth 2 (two) times daily in the am and at bedtime.. For anxiety and also for blood pressure. Patient not taking: Reported on 03/02/2015 08/21/11 08/20/12  Viviann Spare, FNP  sertraline (ZOLOFT) 100 MG tablet Take 2 tablets (200 mg total) by mouth daily. For depression. Patient not taking: Reported on 03/02/2015 08/21/11 08/20/12  Viviann Spare, FNP  valsartan-hydrochlorothiazide (DIOVAN-HCT) 160-12.5 MG per tablet Take 1  tablet by mouth daily. For hypertension. Patient not taking: Reported on 03/02/2015 08/21/11   Viviann Spare, FNP   BP 159/88 mmHg  Pulse 99  Temp(Src) 97.9 F (36.6 C) (Oral)  Resp 20  Ht 5\' 3"  (1.6 m)  Wt 175 lb 1.6 oz (79.425 kg)  BMI 31.03 kg/m2  SpO2 99% Physical Exam  Constitutional: She is oriented to person, place, and time. She appears well-developed and well-nourished. No distress.  HENT:  Head: Normocephalic and atraumatic.  Mouth/Throat: Oropharynx is clear and moist. No oropharyngeal exudate.  Posterior scalp hematoma  Eyes: Conjunctivae and EOM are normal. Pupils are equal, round, and reactive to light.  Neck: Normal range of motion. Neck supple.  Diffuse C spine tenderness  Cardiovascular: Normal rate, regular rhythm, normal heart sounds and intact distal pulses.   No murmur heard. Pulmonary/Chest: Effort normal and breath sounds normal. No respiratory distress.  Abdominal: Soft. There is no tenderness. There is no rebound and no guarding.  Musculoskeletal: Normal range of motion. She exhibits tenderness. She exhibits no edema.  Scattered ecchymosis to paraspinal thoracic and lumbar spine, no midline tenderness  Neurological: She is alert and oriented to person, place, and time. No cranial nerve deficit. She exhibits normal muscle tone. Coordination normal.  No ataxia on finger to nose bilaterally. No pronator drift. 5/5 strength throughout. CN 2-12 intact. Equal grip strength. Sensation intact.   Skin: Skin is warm.  Psychiatric: She has a normal mood and affect. Her behavior is normal.  Nursing note and vitals reviewed.   ED Course  Procedures (including critical care time) Labs Review Labs Reviewed  CBC - Abnormal; Notable for the following:    WBC 11.3 (*)    RBC 3.35 (*)    Hemoglobin 11.2 (*)    HCT 33.4 (*)    Platelets 144 (*)    All other components within normal limits  BASIC METABOLIC PANEL - Abnormal; Notable for the following:    Potassium  3.1 (*)    Chloride 99 (*)    Glucose, Bld 135 (*)    Creatinine, Ser 1.28 (*)    Calcium 8.8 (*)    GFR calc non Af Amer 45 (*)    GFR calc Af Amer 53 (*)    All other components within normal limits  CBG MONITORING, ED - Abnormal; Notable for the following:    Glucose-Capillary 117 (*)    All other components within normal limits  I-STAT CHEM 8, ED - Abnormal; Notable for the following:    Potassium 3.0 (*)    Chloride 98 (*)    Creatinine, Ser 1.20 (*)    Glucose, Bld 130 (*)    Calcium, Ion 1.11 (*)    All other components within normal limits  TROPONIN I  URINALYSIS, ROUTINE W REFLEX MICROSCOPIC (NOT AT [facility])  URINE RAPID DRUG SCREEN, HOSP PERFORMED  CBG MONITORING, ED    Imaging Review Dg Thoracic Spine 2 View  03/02/2015   CLINICAL DATA:  Fall yesterday with thoracic  pain. Initial encounter.  EXAM: THORACIC SPINE - 2-3 VIEWS  COMPARISON:  04/04/2010 and prior chest radiograph  FINDINGS: There is no evidence of acute fracture or subluxation.  Mild degenerative disc disease at T7-T8 again noted.  No focal bony lesions are identified.  Anterior fusion changes in the lower cervical spine are again identified.  IMPRESSION: No acute abnormalities.   Electronically Signed   By: Harmon Pier M.D.   On: 03/02/2015 14:05   Dg Lumbar Spine Complete  03/02/2015   CLINICAL DATA:  Patient status post fall 2 days prior. Back pain. Initial encounter.  EXAM: LUMBAR SPINE - COMPLETE 4+ VIEW  COMPARISON:  None.  FINDINGS: Normal anatomic alignment. Mild anterior height loss of the L1 vertebral body. L1-2 degenerative disc disease with anterior endplate osteophytosis. Lower lumbar spine facet degenerative changes. The SI joints are unremarkable. Pelvic phleboliths. T11-T12 degenerative disc disease.  IMPRESSION: Mild anterior height loss of the L1 vertebral body, which appears to have been present on MR 01/22/2007. Recommend correlation for point tenderness.  L1-2 degenerative disc disease.    Electronically Signed   By: Annia Belt M.D.   On: 03/02/2015 14:10   Ct Head Wo Contrast  03/02/2015   CLINICAL DATA:  Patient status post fall 2 days prior. Patient hit the back of her head. No loss of consciousness.  EXAM: CT HEAD WITHOUT CONTRAST  CT CERVICAL SPINE WITHOUT CONTRAST  TECHNIQUE: Multidetector CT imaging of the head and cervical spine was performed following the standard protocol without intravenous contrast. Multiplanar CT image reconstructions of the cervical spine were also generated.  COMPARISON:  Brain CT 07/31/2011  FINDINGS: CT HEAD FINDINGS  Ventricles and sulci are appropriate for patient's age. Overlying the right cerebral convexity there is isodense fluid measuring up to 5 mm. There is approximately 2 mm of right to left midline shift. No evidence for acute cortically based infarct. Periventricular and subcortical white matter hypodensity compatible with chronic small vessel ischemic changes. Orbits are unremarkable. Mucosal thickening left maxillary sinus. Remainder the paranasal sinuses are unremarkable. Mastoid air cells are well aerated. There is a nondisplaced fracture through the posterior left calvarium extending to the skullbase, possibly involving the left occipital condyle.  CT CERVICAL SPINE FINDINGS  Patient status post anterior plate and screw fixation with fusion of C5, C6 and C7 vertebral bodies. There is degenerative disc disease with mild retrolisthesis of C4 on C5. Bulky anterior osteophytes at C4 level. Craniocervical junction is intact. Focal kyphosis C4-5 level. Lung apices are clear. Visualized thyroid is unremarkable.  IMPRESSION: Nondisplaced fracture through posterior left calvarium extending to the skullbase, possibly extending to left occipital condyle.  Isodense fluid measuring up to 5 mm overlying the right cerebral hemisphere with mild right to left midline shift. This may represent a chronic subdural hematoma or potentially an acute hygroma. This  collection was not present on prior CT 12/17/2007. Recommend correlation for interval history.  Anterior cervical spinal fusion hardware from the C5-C7 levels with associated C4-5 degenerative disc disease and focal kyphosis at this level. If there is concern for ligamentous injury at this location consider MRI.  No definite evidence for acute cervical spine fracture.  Chronic small vessel ischemic change.  Critical Value/emergent results were called by telephone at the time of interpretation on 03/02/2015 at 1:25 pm to Dr. Glynn Octave , who verbally acknowledged these results.   Electronically Signed   By: Annia Belt M.D.   On: 03/02/2015 13:38   Ct Cervical Spine Wo  Contrast  03/02/2015   CLINICAL DATA:  Patient status post fall 2 days prior. Patient hit the back of her head. No loss of consciousness.  EXAM: CT HEAD WITHOUT CONTRAST  CT CERVICAL SPINE WITHOUT CONTRAST  TECHNIQUE: Multidetector CT imaging of the head and cervical spine was performed following the standard protocol without intravenous contrast. Multiplanar CT image reconstructions of the cervical spine were also generated.  COMPARISON:  Brain CT 07/31/2011  FINDINGS: CT HEAD FINDINGS  Ventricles and sulci are appropriate for patient's age. Overlying the right cerebral convexity there is isodense fluid measuring up to 5 mm. There is approximately 2 mm of right to left midline shift. No evidence for acute cortically based infarct. Periventricular and subcortical white matter hypodensity compatible with chronic small vessel ischemic changes. Orbits are unremarkable. Mucosal thickening left maxillary sinus. Remainder the paranasal sinuses are unremarkable. Mastoid air cells are well aerated. There is a nondisplaced fracture through the posterior left calvarium extending to the skullbase, possibly involving the left occipital condyle.  CT CERVICAL SPINE FINDINGS  Patient status post anterior plate and screw fixation with fusion of C5, C6 and C7  vertebral bodies. There is degenerative disc disease with mild retrolisthesis of C4 on C5. Bulky anterior osteophytes at C4 level. Craniocervical junction is intact. Focal kyphosis C4-5 level. Lung apices are clear. Visualized thyroid is unremarkable.  IMPRESSION: Nondisplaced fracture through posterior left calvarium extending to the skullbase, possibly extending to left occipital condyle.  Isodense fluid measuring up to 5 mm overlying the right cerebral hemisphere with mild right to left midline shift. This may represent a chronic subdural hematoma or potentially an acute hygroma. This collection was not present on prior CT 12/17/2007. Recommend correlation for interval history.  Anterior cervical spinal fusion hardware from the C5-C7 levels with associated C4-5 degenerative disc disease and focal kyphosis at this level. If there is concern for ligamentous injury at this location consider MRI.  No definite evidence for acute cervical spine fracture.  Chronic small vessel ischemic change.  Critical Value/emergent results were called by telephone at the time of interpretation on 03/02/2015 at 1:25 pm to Dr. Glynn OctaveSTEPHEN Storm Sovine , who verbally acknowledged these results.   Electronically Signed   By: Annia Beltrew  Davis M.D.   On: 03/02/2015 13:38     EKG Interpretation   Date/Time:  Saturday March 02 2015 12:18:20 EDT Ventricular Rate:  97 PR Interval:  161 QRS Duration: 88 QT Interval:  396 QTC Calculation: 503 R Axis:   -165 Text Interpretation:  Sinus rhythm Low voltage, precordial leads Probable  right ventricular hypertrophy Borderline prolonged QT interval septal T  wave inversions Confirmed by Manus GunningANCOUR  MD, Shakesha Soltau [phone]([id number]) on 03/02/2015  12:44:35 PM      MDM   Final diagnoses:  Fall  Skull fracture, closed, initial encounter  Recurrent falls   Headache after fall 2 days ago. Patient states recurrent falls for quite some time without known etiology. Neurologically intact.  CT head calvarium fracture  as well as 5 mm isodense lesion concerning for chronic subdural versus hygroma. Discussed with Dr. Franky Machoabbell. He is reassured that this happened 2 days ago and does not recommend anything from a neurosurgical standpoint.  EKG shows normal sinus rhythm. Patient with frequent falls of unknown etiology. No preceding prodrome. Unclear whether she is having syncope.  She will benefit for observation admission given her skull fracture and possible subdural hematoma. She also need further evaluation for her recurrent falls. [facility].    Glynn OctaveStephen Shykeem Resurreccion,  MD 03/02/15 1636

## 2015-03-03 DIAGNOSIS — Y9301 Activity, walking, marching and hiking: Secondary | ICD-10-CM

## 2015-03-03 DIAGNOSIS — F329 Major depressive disorder, single episode, unspecified: Secondary | ICD-10-CM

## 2015-03-03 DIAGNOSIS — S0291XA Unspecified fracture of skull, initial encounter for closed fracture: Secondary | ICD-10-CM

## 2015-03-03 DIAGNOSIS — I951 Orthostatic hypotension: Secondary | ICD-10-CM

## 2015-03-03 DIAGNOSIS — I10 Essential (primary) hypertension: Secondary | ICD-10-CM

## 2015-03-03 DIAGNOSIS — Y929 Unspecified place or not applicable: Secondary | ICD-10-CM

## 2015-03-03 DIAGNOSIS — Z9181 History of falling: Secondary | ICD-10-CM

## 2015-03-03 DIAGNOSIS — F1020 Alcohol dependence, uncomplicated: Secondary | ICD-10-CM

## 2015-03-03 DIAGNOSIS — D696 Thrombocytopenia, unspecified: Secondary | ICD-10-CM

## 2015-03-03 DIAGNOSIS — S06300A Unspecified focal traumatic brain injury without loss of consciousness, initial encounter: Secondary | ICD-10-CM

## 2015-03-03 DIAGNOSIS — N179 Acute kidney failure, unspecified: Secondary | ICD-10-CM

## 2015-03-03 DIAGNOSIS — W19XXXA Unspecified fall, initial encounter: Secondary | ICD-10-CM

## 2015-03-03 DIAGNOSIS — Z8659 Personal history of other mental and behavioral disorders: Secondary | ICD-10-CM

## 2015-03-03 DIAGNOSIS — D72829 Elevated white blood cell count, unspecified: Secondary | ICD-10-CM

## 2015-03-03 DIAGNOSIS — D539 Nutritional anemia, unspecified: Secondary | ICD-10-CM

## 2015-03-03 LAB — BASIC METABOLIC PANEL
Anion gap: 8 (ref 5–15)
BUN: 9 mg/dL (ref 6–20)
CO2: 23 mmol/L (ref 22–32)
CREATININE: 1.18 mg/dL — AB (ref 0.44–1.00)
Calcium: 7.5 mg/dL — ABNORMAL LOW (ref 8.9–10.3)
Chloride: 105 mmol/L (ref 101–111)
GFR calc non Af Amer: 50 mL/min — ABNORMAL LOW (ref >60)
GFR, EST AFRICAN AMERICAN: 58 mL/min — AB (ref >60)
Glucose, Bld: 132 mg/dL — ABNORMAL HIGH (ref 65–99)
Potassium: 2.9 mmol/L — ABNORMAL LOW (ref 3.5–5.1)
Sodium: 136 mmol/L (ref 135–145)

## 2015-03-03 LAB — CBC
HEMATOCRIT: 30.2 % — AB (ref 36.0–46.0)
HEMOGLOBIN: 10.2 g/dL — AB (ref 12.0–15.0)
MCH: 34.3 pg — ABNORMAL HIGH (ref 26.0–34.0)
MCHC: 33.8 g/dL (ref 30.0–36.0)
MCV: 101.7 fL — ABNORMAL HIGH (ref 78.0–100.0)
Platelets: 126 10*3/uL — ABNORMAL LOW (ref 150–400)
RBC: 2.97 MIL/uL — ABNORMAL LOW (ref 3.87–5.11)
RDW: 15 % (ref 11.5–15.5)
WBC: 9.5 10*3/uL (ref 4.0–10.5)

## 2015-03-03 LAB — MAGNESIUM: Magnesium: 1 mg/dL — ABNORMAL LOW (ref 1.7–2.4)

## 2015-03-03 LAB — TSH: TSH: 4.473 u[IU]/mL (ref 0.350–4.500)

## 2015-03-03 MED ORDER — FOLIC ACID 1 MG PO TABS
1.0000 mg | ORAL_TABLET | Freq: Every day | ORAL | Status: DC
  Administered 2015-03-03 – 2015-03-05 (×3): 1 mg via ORAL
  Filled 2015-03-03 (×3): qty 1

## 2015-03-03 MED ORDER — PROMETHAZINE HCL 25 MG PO TABS
12.5000 mg | ORAL_TABLET | Freq: Four times a day (QID) | ORAL | Status: DC | PRN
  Administered 2015-03-03 – 2015-03-04 (×2): 12.5 mg via ORAL
  Filled 2015-03-03 (×2): qty 1

## 2015-03-03 MED ORDER — VITAMIN B-1 100 MG PO TABS
100.0000 mg | ORAL_TABLET | Freq: Every day | ORAL | Status: DC
  Administered 2015-03-03 – 2015-03-05 (×3): 100 mg via ORAL
  Filled 2015-03-03 (×3): qty 1

## 2015-03-03 MED ORDER — POTASSIUM CHLORIDE CRYS ER 20 MEQ PO TBCR
40.0000 meq | EXTENDED_RELEASE_TABLET | Freq: Two times a day (BID) | ORAL | Status: AC
  Administered 2015-03-03: 40 meq via ORAL
  Filled 2015-03-03: qty 2

## 2015-03-03 MED ORDER — MAGNESIUM SULFATE 2 GM/50ML IV SOLN
2.0000 g | Freq: Once | INTRAVENOUS | Status: AC
  Administered 2015-03-03: 2 g via INTRAVENOUS
  Filled 2015-03-03: qty 50

## 2015-03-03 MED ORDER — SODIUM CHLORIDE 0.9 % IV SOLN
INTRAVENOUS | Status: AC
  Administered 2015-03-03 (×2): 125 mL via INTRAVENOUS

## 2015-03-03 NOTE — Progress Notes (Signed)
Spoke to MD on call regarding BP, have orders to call MD with a SBP >160, no symptoms in patient. MD says no orders at this time, keep fluids running. Will continue to monitor

## 2015-03-03 NOTE — Evaluation (Signed)
Physical Therapy Evaluation Patient Details Name: Vanessa Hoffman MRN: [id number] DOB: 1955-11-23 Today's Date: 03/03/2015   History of Present Illness  [name] is a 59 year old woman with history of hypertension, fibromyalgia, depression/anxiety presenting after fall. CT head/c-spine with nondisplaced fracture through posterior left calvarium extending to the skull base and isodense fluid overlying the right cerebral hemisphere with mild right to left midline shift.  Clinical Impression  Pt admitted with the above complications. Pt currently with functional limitations due to the deficits listed below (see PT Problem List). Min assist for patient mobilize safely, limited by fatigue, nausea, and dizziness today. Husband currently hospitalized and pt will not have any support at home. Difficulty following simple commands at times and is easily distracted during evaluation. I do not feel patient can adequately care for herself at home and has a history of frequent falls PTA. (see vestibular assessment.) Pt would benefit most from SNF stay for rehabilitation to improve her safety with mobility and functional tasks. Pt will benefit from skilled PT to increase their independence and safety with mobility to allow discharge to the venue listed below.       Follow Up Recommendations SNF;Supervision/Assistance - 24 hour    Equipment Recommendations  None recommended by PT    Recommendations for Other Services Speech consult (Cognitive assessment)     Precautions / Restrictions Precautions Precautions: Fall Restrictions Weight Bearing Restrictions: No      Mobility  Bed Mobility Overal bed mobility: Needs Assistance Bed Mobility: Rolling;Sidelying to Sit Rolling: Supervision Sidelying to sit: Min assist       General bed mobility comments: Rolls with use of bed rail. Min assist for truncal support to rise to edge of bed. Increased dizziness after performing Epley's  maneuver and sitting up.  Transfers Overall transfer level: Needs assistance Equipment used: Rolling walker (2 wheeled) Transfers: Sit to/from Stand Sit to Stand: Min assist         General transfer comment: Min assist for stability to rise from seated position. Performed x2 from bed. Only tolerated x1 min first attempt and needed to sit due to reported LE weakness.  Ambulation/Gait Ambulation/Gait assistance: Min assist Ambulation Distance (Feet): 40 Feet Assistive device: Rolling walker (2 wheeled) Gait Pattern/deviations: Step-through pattern;Decreased stride length;Wide base of support Gait velocity: decreased Gait velocity interpretation: Below normal speed for age/gender General Gait Details: Min assist intermittently for walker placement, especially with turns. VC for safe DME use with a rolling walker. No overt loss of balance, reports significant LE weakness however no buckling noted with this short distance.  Stairs            Wheelchair Mobility    Modified Rankin (Stroke Patients Only)       Balance Overall balance assessment: Needs assistance;History of Falls Sitting-balance support: No upper extremity supported;Feet supported Sitting balance-Leahy Scale: Fair     Standing balance support: No upper extremity supported Standing balance-Leahy Scale: Fair                               Pertinent Vitals/Pain Pain Assessment: 0-10 Pain Score: 10-Worst pain ever Pain Location: Posterior skull Pain Descriptors / Indicators: Shooting Pain Intervention(s): Monitored during session;Repositioned    Home Living Family/patient expects to be discharged to:: Private residence Living Arrangements: Spouse/significant other Available Help at Discharge: Family (Husband is in hosptial. No family/friends available at d/c) Type of Home: Apartment Home Access: Level entry  Home Layout: One level Home Equipment: Walker - 2 wheels;Cane - single point       Prior Function Level of Independence: Needs assistance   Gait / Transfers Assistance Needed: RW for ambulating  ADL's / Homemaking Assistance Needed: Husband assists dressing        Hand Dominance   Dominant Hand: Right    Extremity/Trunk Assessment   Upper Extremity Assessment: Defer to OT evaluation           Lower Extremity Assessment: Generalized weakness         Communication   Communication: No difficulties  Cognition Arousal/Alertness: Awake/alert Behavior During Therapy: WFL for tasks assessed/performed Overall Cognitive Status: No family/caregiver present to determine baseline cognitive functioning Area of Impairment: Following commands;Problem solving;Attention;Safety/judgement   Current Attention Level: Focused Memory: Decreased short-term memory Following Commands: Follows one step commands inconsistently Safety/Judgement: Decreased awareness of deficits;Decreased awareness of safety   Problem Solving: Slow processing;Difficulty sequencing;Requires verbal cues General Comments: Difficulty following commands with simple tasks such as finger-nose-finger test and keeping head in single position for testing. Quickly forgets about task at hand.    General Comments General comments (skin integrity, edema, etc.): See Vestibular assessment note. - Also of note, pt with BIL UE dysmetria with FNF testing although difficulty to have pt finish test due to being easily distracted.    Exercises        Assessment/Plan    PT Assessment Patient needs continued PT services  PT Diagnosis Difficulty walking;Generalized weakness;Acute pain;Altered mental status   PT Problem List Decreased strength;Decreased activity tolerance;Decreased balance;Decreased mobility;Decreased coordination;Decreased cognition;Decreased knowledge of use of DME;Decreased safety awareness;Pain  PT Treatment Interventions DME instruction;Gait training;Functional mobility  training;Therapeutic activities;Therapeutic exercise;Balance training;Neuromuscular re-education;Cognitive remediation;Patient/family education;Modalities   PT Goals (Current goals can be found in the Care Plan section) Acute Rehab PT Goals Patient Stated Goal: None stated PT Goal Formulation: With patient Time For Goal Achievement: 03/17/15 Potential to Achieve Goals: Good    Frequency Min 3X/week   Barriers to discharge Decreased caregiver support Husband in hospital. Will be alone if d/c home. Needs 24 hour supervision and assistance for safety with continued rehab.    Co-evaluation               End of Session Equipment Utilized During Treatment: Gait belt Activity Tolerance: Patient limited by fatigue Patient left: in chair;with call bell/phone within reach;with chair alarm set Nurse Communication: Mobility status         Time: [id number] PT Time Calculation (min) (ACUTE ONLY): 50 min   Charges:   PT Evaluation $Initial PT Evaluation Tier I: 1 Procedure PT Treatments $Therapeutic Activity: 8-22 mins $Canalith Rep Proc: 8-22 mins   PT G Codes:        Berton MountBarbour, Niesha Bame S 03/03/2015, 11:33 AM Charlsie MerlesLogan Secor Ardenia Stiner, PT [phone]

## 2015-03-03 NOTE — Progress Notes (Addendum)
Subjective: No acute events overnight. She had some lightheadedness with standing this morning.   Objective: Vital signs in last 24 hours: Filed Vitals:   03/02/15 1547 03/02/15 2137 03/03/15 0118 03/03/15 0622  BP: 159/88 120/56 113/70 165/96  Pulse: 99 90 89 107  Temp: 97.9 F (36.6 C) 98.2 F (36.8 C) 98.2 F (36.8 C) 97.7 F (36.5 C)  TempSrc:  Oral Oral Oral  Resp: Height:  (1.6 m)     Weight: 175 lb 1.6 oz (79.425 kg)     SpO2: 99% 99% 97% 100%   Weight change:  No intake or output data in the 24 hours ending 03/03/15 0908 General Apperance: NAD HEENT: Posterior scalp hematoma, PERRL, EOMI, anicteric sclera Neck: Supple, trachea midline Lungs: Clear to auscultation bilaterally. No wheezes, rhonchi or rales. Breathing comfortably Heart: Regular rate and rhythm, no murmur/rub/gallop Abdomen: Soft, nontender, nondistended, no rebound/guarding Extremities: Normal, atraumatic, warm and well perfused, no edema Pulses: 2+ throughout Skin: No rashes or lesions Neurologic: Alert and oriented x 3. CNII-XII intact. Normal strength and sensation  Lab Results: Basic Metabolic Panel:  Recent Labs Lab 03/02/15 1213 03/02/15 1224 03/03/15 0539  NA 136 137 136  K 3.1* 3.0* 2.9*  CL 99* 98* 105  CO2 25  --  23  GLUCOSE 135* 130* 132*  BUN CREATININE 1.28* 1.20* 1.18*  CALCIUM 8.8*  --  7.5*  MG  --   --  1.0*   CBC:  Recent Labs Lab 03/02/15 1213 03/02/15 1224 03/03/15 0539  WBC 11.3*  --  9.5  HGB 11.2* 12.9 10.2*  HCT 33.4* 38.0 30.2*  MCV 99.7  --  101.7*  PLT 144*  --  126*   Cardiac Enzymes:  Recent Labs Lab 03/02/15 1213  TROPONINI <0.03   CBG:  Recent Labs Lab 03/02/15 1211  GLUCAP 117*   Thyroid Function Tests:  Recent Labs Lab 03/03/15 0539  TSH 4.473   Urine Drug Screen: Drugs of Abuse     Component Value Date/Time   LABOPIA NONE DETECTED 03/02/2015 1544   COCAINSCRNUR NONE DETECTED 03/02/2015 1544     LABBENZ NONE DETECTED 03/02/2015 1544   AMPHETMU NONE DETECTED 03/02/2015 1544   THCU NONE DETECTED 03/02/2015 1544   LABBARB NONE DETECTED 03/02/2015 1544    Urinalysis:  Recent Labs Lab 03/02/15 1544  COLORURINE AMBER*  LABSPEC 1.012  PHURINE 7.0  GLUCOSEU NEGATIVE  HGBUR NEGATIVE  BILIRUBINUR NEGATIVE  KETONESUR NEGATIVE  PROTEINUR NEGATIVE  UROBILINOGEN 1.0  NITRITE NEGATIVE  LEUKOCYTESUR NEGATIVE    Micro Results: No results found for this or any previous visit (from the past 240 hour(s)). Studies/Results: Dg Thoracic Spine 2 View  03/02/2015   CLINICAL DATA:  Fall yesterday with thoracic pain. Initial encounter.  EXAM: THORACIC SPINE - 2-3 VIEWS  COMPARISON:  04/04/2010 and prior chest radiograph  FINDINGS: There is no evidence of acute fracture or subluxation.  Mild degenerative disc disease at T7-T8 again noted.  No focal bony lesions are identified.  Anterior fusion changes in the lower cervical spine are again identified.  IMPRESSION: No acute abnormalities.   Electronically Signed   By: Harmon Pier M.D.   On: 03/02/2015 14:05   Dg Lumbar Spine Complete  03/02/2015   CLINICAL DATA:  Patient status post fall 2 days prior. Back pain. Initial encounter.  EXAM: LUMBAR SPINE - COMPLETE 4+ VIEW  COMPARISON:  None.  FINDINGS: Normal anatomic alignment. Mild anterior  height loss of the L1 vertebral body. L1-2 degenerative disc disease with anterior endplate osteophytosis. Lower lumbar spine facet degenerative changes. The SI joints are unremarkable. Pelvic phleboliths. T11-T12 degenerative disc disease.  IMPRESSION: Mild anterior height loss of the L1 vertebral body, which appears to have been present on MR 01/22/2007. Recommend correlation for point tenderness.  L1-2 degenerative disc disease.   Electronically Signed   By: Annia Belt M.D.   On: 03/02/2015 14:10   Ct Head Wo Contrast  03/02/2015   CLINICAL DATA:  Patient status post fall 2 days prior. Patient hit the back of her  head. No loss of consciousness.  EXAM: CT HEAD WITHOUT CONTRAST  CT CERVICAL SPINE WITHOUT CONTRAST  TECHNIQUE: Multidetector CT imaging of the head and cervical spine was performed following the standard protocol without intravenous contrast. Multiplanar CT image reconstructions of the cervical spine were also generated.  COMPARISON:  Brain CT 07/31/2011  FINDINGS: CT HEAD FINDINGS  Ventricles and sulci are appropriate for patient's age. Overlying the right cerebral convexity there is isodense fluid measuring up to 5 mm. There is approximately 2 mm of right to left midline shift. No evidence for acute cortically based infarct. Periventricular and subcortical white matter hypodensity compatible with chronic small vessel ischemic changes. Orbits are unremarkable. Mucosal thickening left maxillary sinus. Remainder the paranasal sinuses are unremarkable. Mastoid air cells are well aerated. There is a nondisplaced fracture through the posterior left calvarium extending to the skullbase, possibly involving the left occipital condyle.  CT CERVICAL SPINE FINDINGS  Patient status post anterior plate and screw fixation with fusion of C5, C6 and C7 vertebral bodies. There is degenerative disc disease with mild retrolisthesis of C4 on C5. Bulky anterior osteophytes at C4 level. Craniocervical junction is intact. Focal kyphosis C4-5 level. Lung apices are clear. Visualized thyroid is unremarkable.  IMPRESSION: Nondisplaced fracture through posterior left calvarium extending to the skullbase, possibly extending to left occipital condyle.  Isodense fluid measuring up to 5 mm overlying the right cerebral hemisphere with mild right to left midline shift. This may represent a chronic subdural hematoma or potentially an acute hygroma. This collection was not present on prior CT 12/17/2007. Recommend correlation for interval history.  Anterior cervical spinal fusion hardware from the C5-C7 levels with associated C4-5 degenerative disc  disease and focal kyphosis at this level. If there is concern for ligamentous injury at this location consider MRI.  No definite evidence for acute cervical spine fracture.  Chronic small vessel ischemic change.  Critical Value/emergent results were called by telephone at the time of interpretation on 03/02/2015 at 1:25 pm to Dr. Glynn Octave , who verbally acknowledged these results.   Electronically Signed   By: Annia Belt M.D.   On: 03/02/2015 13:38   Ct Cervical Spine Wo Contrast  03/02/2015   CLINICAL DATA:  Patient status post fall 2 days prior. Patient hit the back of her head. No loss of consciousness.  EXAM: CT HEAD WITHOUT CONTRAST  CT CERVICAL SPINE WITHOUT CONTRAST  TECHNIQUE: Multidetector CT imaging of the head and cervical spine was performed following the standard protocol without intravenous contrast. Multiplanar CT image reconstructions of the cervical spine were also generated.  COMPARISON:  Brain CT 07/31/2011  FINDINGS: CT HEAD FINDINGS  Ventricles and sulci are appropriate for patient's age. Overlying the right cerebral convexity there is isodense fluid measuring up to 5 mm. There is approximately 2 mm of right to left midline shift. No evidence for acute cortically based infarct.  Periventricular and subcortical white matter hypodensity compatible with chronic small vessel ischemic changes. Orbits are unremarkable. Mucosal thickening left maxillary sinus. Remainder the paranasal sinuses are unremarkable. Mastoid air cells are well aerated. There is a nondisplaced fracture through the posterior left calvarium extending to the skullbase, possibly involving the left occipital condyle.  CT CERVICAL SPINE FINDINGS  Patient status post anterior plate and screw fixation with fusion of C5, C6 and C7 vertebral bodies. There is degenerative disc disease with mild retrolisthesis of C4 on C5. Bulky anterior osteophytes at C4 level. Craniocervical junction is intact. Focal kyphosis C4-5 level. Lung  apices are clear. Visualized thyroid is unremarkable.  IMPRESSION: Nondisplaced fracture through posterior left calvarium extending to the skullbase, possibly extending to left occipital condyle.  Isodense fluid measuring up to 5 mm overlying the right cerebral hemisphere with mild right to left midline shift. This may represent a chronic subdural hematoma or potentially an acute hygroma. This collection was not present on prior CT 12/17/2007. Recommend correlation for interval history.  Anterior cervical spinal fusion hardware from the C5-C7 levels with associated C4-5 degenerative disc disease and focal kyphosis at this level. If there is concern for ligamentous injury at this location consider MRI.  No definite evidence for acute cervical spine fracture.  Chronic small vessel ischemic change.  Critical Value/emergent results were called by telephone at the time of interpretation on 03/02/2015 at 1:25 pm to Dr. Glynn Octave , who verbally acknowledged these results.   Electronically Signed   By: Annia Belt M.D.   On: 03/02/2015 13:38   Medications: I have reviewed the patient's current medications. Scheduled Meds: . enoxaparin (LOVENOX) injection  40 mg Subcutaneous Q24H  . folic acid  1 mg Oral Daily  . magnesium sulfate 1 - 4 g bolus IVPB  2 g Intravenous Once  . potassium chloride  40 mEq Oral BID  . pravastatin  20 mg Oral QHS  . sertraline  200 mg Oral Daily  . sodium chloride  3 mL Intravenous Q12H  . thiamine  100 mg Oral Daily   Continuous Infusions: . sodium chloride     PRN Meds:.acetaminophen, HYDROcodone-acetaminophen Assessment/Plan: Active Problems:   Skull fracture, non depressed   Frequent falls  Frequent falls: history of alcohol abuse but denies recent heavy alcohol use. She reports problems with gait as her legs become acutely weak prior to her falls. Positive orthostatics on admission- BP 130/79 lying, 126/72 sitting, and 103/30 standing. She remains orthostatic this  morning with BP lying 165/96 and standing 97/66. EKG unchanged in AM. TSH normal at 4.473. UDS unremarkable. She has many factors that contribute to orthostatic hypotension including: alcohol, sertraline, amitriptyline, donepezil. Her antihypertensives can cause this as well but she reports not taking them. -PT/OT with vestibular evaluation by PT -Echo -NS @ 125 ml/hr -Hold home amitriptyline 50mg  BID and donepezil 10mg  QHS. She will need to be evaluated by outpatient psych or her primary care for adjustment of her mental health regimen. -Thiamine and folate supplementation -Hgb A1c pending -compression hose -Telemetry  Skull fracture: CT head/c-spine with nondisplaced fracture through posterior left calvarium extending to the skull base and isodense fluid overlying the right cerebral hemisphere with mild right to left midline shift. Findings discussed with Dr. Mikal Plane by ED. He does not recommend neurosurgical intervention. -Tylenol 1000mg  q 6 hr prn moderate pain -Norco 5/325mg  q 12 hr prn severe pain -Will limit acetaminophen to less than 4g in 24 hours -Neurochecks q4hr  AKI: Creatinine on admission  1.28. Her baseline is around 0.9. Likely prerenal. Creatinine down to 1.18 today. -IV fluids as above  Leukocytosis: likely 2/2 skull fracture. Afebrile. Resolved today. -Continue to monitor.  Macrocytic anemia: Hgb 10.2 with MCV 101.7. Previous baseline around 10. -Check Vitamin B12 and Folate level -Start folate supplementation  Thrombocytopenia: Platelets 144 on admission. Previous platelet 142 08/13/2011. May be secondary to alcohol abuse history. -Continue to monitor.  Hypertension: Not on any medications at home. Normotensive in ED. -Continue to monitor.  Major depression with history of psychosis:  -Hold home amitriptyline 50mg  BID and donepezil 10mg  QHS. -Continue home sertraline 200mg  daily  FEN: General diet -Hypomagnesemia and hypokalemia: repleted with IV MgS and PO  KCl  VTE ppx: Lovenox  Dispo: Disposition is deferred at this time, awaiting improvement of current medical problems.  Anticipated discharge in approximately 1-2 day(s).   The patient does have a current PCP (No primary care provider on file.) and [facility] not [facility] after discharge.  The patient does not have transportation limitations that hinder transportation to clinic appointments.  .Services Needed at time of discharge: Y = Yes, Blank = No PT:   OT:   RN:   Equipment:   Other:     LOS: 1 day   Lora PaulaJennifer T Neilah Fulwider, MD 03/03/2015, 9:08 AM

## 2015-03-03 NOTE — Progress Notes (Signed)
Patient seen and examined. Case d/w residents in detail.  HPI: 59 y/o female with PMH of HTN, fibromyalgia, depression who p/w HA s/p fall 2 days ago. Patient states she was walking to her car when she felt like her legs just gave out. No syncope, no lightheadedness, no fevers/chills, no CP, no sob, no palpitations, no abd pain, no n/v, no diarrhea. Patient was unable to stand for approx 1 hour and had to crawl. Of note, patient has a history of frequent falls and normally uses a rolling walker or cane to get around.   Physical Exam: Gen: AAO*3., NAD CVS: RRR, normal heart sounds Lungs: CTA b/l Abd: soft, non tender, BS + Ext: No edema HEENT: posterior scalp hematoma +  Assessment and Plan: 59 y/o female with weakness and recent fall with skull fx and overlying hematoma. Neurosurgery called- no acute intervention at this time. Will c/w pain control and neuro checks. Will get PT/OT eval for frequent falls. Will also check an ECHO and monitor on tele for frequent falls work up.   Of note patient with + orthostatics which could explain her frequent falls. C/w IVF for now. If no improvement may need compression stockings. Will f/u B12 levels give macrocytic anemia and frequent falls.   AKI is resolving. C/w IVF for now. Likely prerenal

## 2015-03-03 NOTE — Progress Notes (Signed)
Physical Therapy Vestibular Assessement    03/03/15 1116  Vestibular Assessment  General Observation Patient's complaints of dizziness not consitent with a single origin. 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 No nystagmus noted however reported symptoms consistent with both cupulolithiasis and canalithiasis. Most significantly noted with Rt posterior canal. Treated with Epley's maneuver for Rt posterior canalithiasis. Recommend follow-up with vestibular physical rehabilitation.  Symptom Behavior  Type of Dizziness Comment ("off balance" and "weak")  Frequency of Dizziness 4-5x/day  Duration of Dizziness 10 minutes  Aggravating Factors Supine to sit;Sit to stand;Rolling to right;Rolling to left;Lying supine  Relieving Factors Lying supine (Sitting down)  Occulomotor Exam  Occulomotor Alignment Normal  Spontaneous Absent  Gaze-induced Left beating nystagmus with L gaze  Head shaking Horizontal Absent  Smooth Pursuits Saccades  Saccades Dysmetria  Vestibulo-Occular Reflex  VOR 1 Head Only (x 1 viewing) Loses target in both directions. Very difficult to maintain gaze  VOR 2 Head and Object (x 2 viewing) Overshooting bil when returns eyes to midline  VOR Cancellation Normal  Auditory  Comments Hears soft sounds bilaterally  Positional Testing  Dix-Hallpike Dix-Hallpike Right;Dix-Hallpike Left  Horizontal Canal Testing Horizontal Canal Right;Horizontal Canal Left  Dix-Hallpike Right  Dix-Hallpike Right Duration 1 min  Dix-Hallpike Right Symptoms No nystagmus;Other (comment) (Reports blurred vision, severe dizziness)  Dix-Hallpike Left  Dix-Hallpike Left Duration 1 min  Dix-Hallpike Left Symptoms No nystagmus;Other (comment) (Reports blurred vision, mild dizziness)  Horizontal Canal Right  Horizontal Canal Right Duration 1 min   Horizontal Canal Right Symptoms Normal (Mild dizziness)  Horizontal Canal Left  Horizontal Canal Left Duration 1 min  Horizontal Canal Left Symptoms Normal (Mild dizziness)  Cognition  Cognition Orientation Level Oriented x 4  Cognition Comment Some memory difficulties, and trouble following commands  Orthostatics  Orthostatics Comment SBP noted to drop significantly when tested by nursing staff, HR virutally unchanged.    [address] Bishop AvenueLogan Secor RedwayBarbour, South CarolinaPT [phone]

## 2015-03-03 NOTE — Evaluation (Signed)
Occupational Therapy Evaluation Patient Details Name: Vanessa Hoffman MRN: [id number] DOB: May 15, 1956 Today's Date: 03/03/2015    History of Present Illness [name] is a 59 year old woman with history of hypertension, fibromyalgia, depression/anxiety presenting after fall. CT head/c-spine with nondisplaced fracture through posterior left calvarium extending to the skull base and isodense fluid overlying the right cerebral hemisphere with mild right to left midline shift.   Clinical Impression   Pt admitted with above. She demonstrates the below listed deficits and will benefit from continued OT to maximize safety and independence with BADLs.   Pt presents to OT with impaired balance (h/o falls), impaired cognition, and impaired vision.  Currently, she requires min guard to min A for ADLs.  She reports spouse is in hospital and has no assist at home.  Recommend SNF.       Follow Up Recommendations  SNF    Equipment Recommendations  None recommended by OT    Recommendations for Other Services       Precautions / Restrictions Precautions Precautions: Fall      Mobility Bed Mobility Overal bed mobility: Needs Assistance Bed Mobility: Sit to Supine       Sit to supine: Supervision      Transfers Overall transfer level: Needs assistance Equipment used: Rolling walker (2 wheeled) Transfers: Sit to/from UGI CorporationStand;Stand Pivot Transfers Sit to Stand: Min guard Stand pivot transfers: Min guard       General transfer comment: min guard assist for balance     Balance Overall balance assessment: History of Falls;Needs assistance Sitting-balance support: Feet supported Sitting balance-Leahy Scale: Fair     Standing balance support: Bilateral upper extremity supported Standing balance-Leahy Scale: Poor                              ADL Overall ADL's : Needs assistance/impaired Eating/Feeding: Modified independent;Sitting   Grooming: Wash/dry  hands;Wash/dry face;Oral care;Brushing hair;Min guard;Standing   Upper Body Bathing: Set up;Supervision/ safety;Sitting   Lower Body Bathing: Minimal assistance;Sit to/from stand   Upper Body Dressing : Set up;Sitting   Lower Body Dressing: Minimal assistance;Sit to/from stand   Toilet Transfer: Min guard;Ambulation;Comfort height toilet;RW   Toileting- ArchitectClothing Manipulation and Hygiene: Min guard;Sit to/from stand       Functional mobility during ADLs: Min guard;Rolling walker General ADL Comments: Pt requires increased time for tasks, and assist for balance      Vision Vision Assessment?: Yes Ocular Range of Motion: Within Functional Limits Tracking/Visual Pursuits: Impaired - to be further tested in functional context Visual Fields: No apparent deficits Additional Comments: Pt reports double vision occasionally, but when asked if she sees two object she states "it's just blurry".  Pt with difficulty sustainging attention to complete visual assessment.   Loses object frequently during pursuits    Perception     Praxis Praxis Praxis tested?: Within functional limits    Pertinent Vitals/Pain Pain Assessment: Faces Faces Pain Scale: Hurts little more Pain Location: posterior head  Pain Descriptors / Indicators: Aching Pain Intervention(s): Monitored during session     Hand Dominance Right   Extremity/Trunk Assessment Upper Extremity Assessment Upper Extremity Assessment: RUE deficits/detail;LUE deficits/detail RUE Deficits / Details: 4-/5 LUE Deficits / Details: 4-/5   Lower Extremity Assessment Lower Extremity Assessment: Generalized weakness       Communication Communication Communication: No difficulties   Cognition Arousal/Alertness: Awake/alert Behavior During Therapy: Flat affect Overall Cognitive Status: No family/caregiver present to determine  baseline cognitive functioning Area of Impairment: Attention;Memory;Safety/judgement;Following  commands;Awareness;Problem solving   Current Attention Level: Sustained (with min - mod cues) Memory: Decreased short-term memory Following Commands: Follows one step commands consistently;Follows one step commands with increased time Safety/Judgement: Decreased awareness of safety;Decreased awareness of deficits Awareness: Intellectual Problem Solving: Slow processing;Decreased initiation;Difficulty sequencing;Requires verbal cues;Requires tactile cues General Comments: Pt with delayed response time.   Slow to follow simple commands.  Difficulty providing info re: PLOF   General Comments       Exercises       Shoulder Instructions      Home Living Family/patient expects to be discharged to:: Skilled nursing facility                                 Additional Comments: spouse is currently in ICU      Prior Functioning/Environment Level of Independence: Needs assistance  Gait / Transfers Assistance Needed: RW for ambulating ADL's / Homemaking Assistance Needed: Husband assists dressing        OT Diagnosis: Generalized weakness;Cognitive deficits;Disturbance of vision;Acute pain   OT Problem List: Decreased strength;Decreased activity tolerance;Impaired balance (sitting and/or standing);Impaired vision/perception;Decreased coordination;Decreased cognition;Decreased safety awareness;Decreased knowledge of use of DME or AE;Pain   OT Treatment/Interventions: Self-care/ADL training;DME and/or AE instruction;Therapeutic activities;Cognitive remediation/compensation;Visual/perceptual remediation/compensation;Patient/family education;Balance training    OT Goals(Current goals can be found in the care plan section) Acute Rehab OT Goals Patient Stated Goal: did not state  OT Goal Formulation: With patient Time For Goal Achievement: 03/17/15 Potential to Achieve Goals: Good ADL Goals Pt Will Perform Grooming: with supervision;standing Pt Will Perform Lower Body  Bathing: with supervision;sit to/from stand Pt Will Perform Lower Body Dressing: with supervision;sit to/from stand Pt Will Transfer to Toilet: with supervision;ambulating;regular height toilet;bedside commode;grab bars Pt Will Perform Toileting - Clothing Manipulation and hygiene: with supervision;sit to/from stand  OT Frequency: Min 2X/week   Barriers to D/C: Decreased caregiver support          Co-evaluation              End of Session Equipment Utilized During Treatment: Rolling walker Nurse Communication: Mobility status  Activity Tolerance: Patient tolerated treatment well Patient left: in bed;with call bell/phone within reach;with bed alarm set   Time: 1321-1344 OT Time Calculation (min): 23 min Charges:  OT General Charges $OT Visit: 1 Procedure OT Evaluation $Initial OT Evaluation Tier I: 1 Procedure OT Treatments $Self Care/Home Management : 8-22 mins G-Codes:    Kadeisha Betsch M Mar 28, 2015, 4:48 PM

## 2015-03-04 ENCOUNTER — Inpatient Hospital Stay (HOSPITAL_COMMUNITY): Payer: Medicare Other

## 2015-03-04 DIAGNOSIS — R110 Nausea: Secondary | ICD-10-CM

## 2015-03-04 DIAGNOSIS — R7309 Other abnormal glucose: Secondary | ICD-10-CM

## 2015-03-04 DIAGNOSIS — R55 Syncope and collapse: Secondary | ICD-10-CM

## 2015-03-04 LAB — CBC
HEMATOCRIT: 30.6 % — AB (ref 36.0–46.0)
HEMOGLOBIN: 10.1 g/dL — AB (ref 12.0–15.0)
MCH: 33 pg (ref 26.0–34.0)
MCHC: 33 g/dL (ref 30.0–36.0)
MCV: 100 fL (ref 78.0–100.0)
Platelets: 138 10*3/uL — ABNORMAL LOW (ref 150–400)
RBC: 3.06 MIL/uL — ABNORMAL LOW (ref 3.87–5.11)
RDW: 15.1 % (ref 11.5–15.5)
WBC: 9.6 10*3/uL (ref 4.0–10.5)

## 2015-03-04 LAB — BASIC METABOLIC PANEL
ANION GAP: 7 (ref 5–15)
BUN: 7 mg/dL (ref 6–20)
CALCIUM: 7.3 mg/dL — AB (ref 8.9–10.3)
CO2: 22 mmol/L (ref 22–32)
Chloride: 106 mmol/L (ref 101–111)
Creatinine, Ser: 0.89 mg/dL (ref 0.44–1.00)
GFR calc Af Amer: >60 mL/min (ref >60)
GFR calc non Af Amer: >60 mL/min (ref >60)
Glucose, Bld: 140 mg/dL — ABNORMAL HIGH (ref 65–99)
POTASSIUM: 4 mmol/L (ref 3.5–5.1)
Sodium: 135 mmol/L (ref 135–145)

## 2015-03-04 LAB — HEPATIC FUNCTION PANEL
ALBUMIN: 2.2 g/dL — AB (ref 3.5–5.0)
ALT: 47 U/L (ref 14–54)
AST: 183 U/L — ABNORMAL HIGH (ref 15–41)
Alkaline Phosphatase: 123 U/L (ref 38–126)
BILIRUBIN DIRECT: 0.3 mg/dL (ref 0.1–0.5)
Indirect Bilirubin: 0.3 mg/dL (ref 0.3–0.9)
Total Bilirubin: 0.6 mg/dL (ref 0.3–1.2)
Total Protein: 5.6 g/dL — ABNORMAL LOW (ref 6.5–8.1)

## 2015-03-04 LAB — FOLATE: Folate: 24.5 ng/mL (ref >5.9)

## 2015-03-04 LAB — HEMOGLOBIN A1C
HEMOGLOBIN A1C: 6.3 % — AB (ref 4.8–5.6)
Mean Plasma Glucose: 134 mg/dL

## 2015-03-04 LAB — MAGNESIUM: MAGNESIUM: 1.5 mg/dL — AB (ref 1.7–2.4)

## 2015-03-04 LAB — VITAMIN B12: VITAMIN B 12: 1352 pg/mL — AB (ref 180–914)

## 2015-03-04 MED ORDER — IBUPROFEN 200 MG PO TABS
600.0000 mg | ORAL_TABLET | Freq: Four times a day (QID) | ORAL | Status: DC | PRN
  Administered 2015-03-04 – 2015-03-05 (×3): 600 mg via ORAL
  Filled 2015-03-04 (×3): qty 3

## 2015-03-04 MED ORDER — MECLIZINE HCL 12.5 MG PO TABS
25.0000 mg | ORAL_TABLET | Freq: Once | ORAL | Status: AC
  Administered 2015-03-04: 25 mg via ORAL
  Filled 2015-03-04: qty 2

## 2015-03-04 MED ORDER — MECLIZINE HCL 12.5 MG PO TABS: 25.0000 mg | ORAL_TABLET | Freq: Two times a day (BID) | ORAL | Status: DC | PRN

## 2015-03-04 MED ORDER — AMITRIPTYLINE HCL 25 MG PO TABS
50.0000 mg | ORAL_TABLET | Freq: Two times a day (BID) | ORAL | Status: DC
  Administered 2015-03-04 – 2015-03-05 (×3): 50 mg via ORAL
  Filled 2015-03-04 (×3): qty 2

## 2015-03-04 MED ORDER — MAGNESIUM SULFATE 2 GM/50ML IV SOLN
2.0000 g | Freq: Once | INTRAVENOUS | Status: AC
  Administered 2015-03-04: 2 g via INTRAVENOUS
  Filled 2015-03-04: qty 50

## 2015-03-04 MED ORDER — PROMETHAZINE HCL 25 MG PO TABS: 25.0000 mg | ORAL_TABLET | Freq: Four times a day (QID) | ORAL | Status: DC | PRN

## 2015-03-04 NOTE — Clinical Social Work Note (Signed)
Clinical Social Work Assessment  Patient Details  Name: Vanessa Hoffman MRN: [id number] Date of Birth: [birth year]  Date of referral:  03/03/15               Reason for consult:  Facility Placement                Housing/Transportation Living arrangements for the past 2 months:  Single Family Home Source of Information:  Patient Patient Interpreter Needed:  None Criminal Activity/Legal Involvement Pertinent to Current Situation/Hospitalization:   No  Significant Relationships:  Spouse Lives with:  Spouse Do you feel safe going back to the place where you live?  No Need for family participation in patient care:  No (Coment)  Care giving concerns:  N/A   Facilities manager / plan: CSW met the pt at bedside. CSW introduced self and purpose of the visit. CSW discussed SNF rehab. CSW explained the SNF process. CSW explained insurance and its relation to SNF placement. CSW provided the pt with a SNF list. CSW and pt discussed geographic location in which the she would like to receive rehab. The pt reported that she will like to remain in [facility]. CSW answered all questions in which the pt inquired about. CSW will continue to follow this pt and assist with discharge as needed.   Employment status:  Disabled (Comment on whether or not currently receiving Disability) Insurance information:  Medicare, Medicaid In [place] PT Recommendations:  [place] / Referral to community resources:  [place]  Patient/Family's Response to care: Pt reported that her nursing staff has been great to her.   Patient/Family's Understanding of and Emotional Response to Diagnosis, Current Treatment, and Prognosis: Pt reported being scared about her condition because she does not know the underlining cause of the the weakness in her legs. Pt reported that she will discuss this matter in detail with the doctor.   Emotional Assessment Appearance:  Appears stated  age Attitude/Demeanor/Rapport:   (Calm ) Affect (typically observed):  Pleasant, Appropriate Orientation:  Oriented to Situation, Oriented to  Time, Oriented to Place, Oriented to Self Alcohol / Substance use:  Not Applicable Psych involvement (Current and /or in the community):  No (Comment)  Discharge Needs  Concerns to be addressed:  Denies Needs/Concerns at this time Readmission within the last 30 days:  No Current discharge risk:  None Barriers to Discharge:  No Barriers Identified   Deltha Bernales, LCSW 03/04/2015, 1:50 PM

## 2015-03-04 NOTE — Progress Notes (Signed)
Subjective: No acute events overnight. Reports some headache and nausea. Still has dizziness.  Objective: Vital signs in last 24 hours: Filed Vitals:   03/03/15 2155 03/04/15 0153 03/04/15 0541 03/04/15 0912  BP: 144/82 122/74 127/74 140/76  Pulse: 99 75 96 92  Temp: 97.8 F (36.6 C) 98.1 F (36.7 C) 98 F (36.7 C) 98 F (36.7 C)  TempSrc: Oral Oral Oral Oral  Resp: 20 20 20 20   Height:      Weight:      SpO2: 100% 100% 100% 98%   Weight change:   Intake/Output Summary (Last 24 hours) at 03/04/15 1148 Last data filed at 03/03/15 1500  Gross per 24 hour  Intake    500 ml  Output      0 ml  Net    500 ml   General Apperance: NAD HEENT: Posterior scalp hematoma, PERRL, EOMI, anicteric sclera Neck: Supple, trachea midline Lungs: Clear to auscultation bilaterally. No wheezes, rhonchi or rales. Breathing comfortably Heart: Regular rate and rhythm, no murmur/rub/gallop Abdomen: Soft, nontender, nondistended, no rebound/guarding Extremities: Normal, atraumatic, warm and well perfused, no edema Pulses: 2+ throughout Skin: No rashes or lesions Neurologic: Alert and oriented x 3. CNII-XII intact. Normal strength and sensation  Lab Results: Basic Metabolic Panel:  Recent Labs Lab 03/03/15 0539 03/04/15 0628  NA 136 135  K 2.9* 4.0  CL 105 106  CO2 23 22  GLUCOSE 132* 140*  BUN 9 7  CREATININE 1.18* 0.89  CALCIUM 7.5* 7.3*  MG 1.0* 1.5*   CBC:  Recent Labs Lab 03/03/15 0539 03/04/15 0628  WBC 9.5 9.6  HGB 10.2* 10.1*  HCT 30.2* 30.6*  MCV 101.7* 100.0  PLT 126* 138*   Cardiac Enzymes:  Recent Labs Lab 03/02/15 1213  TROPONINI <0.03   CBG:  Recent Labs Lab 03/02/15 1211  GLUCAP 117*   Thyroid Function Tests:  Recent Labs Lab 03/03/15 0539  TSH 4.473   Urine Drug Screen: Drugs of Abuse     Component Value Date/Time   LABOPIA NONE DETECTED 03/02/2015 1544   COCAINSCRNUR NONE DETECTED 03/02/2015 1544   LABBENZ NONE DETECTED 03/02/2015  1544   AMPHETMU NONE DETECTED 03/02/2015 1544   THCU NONE DETECTED 03/02/2015 1544   LABBARB NONE DETECTED 03/02/2015 1544    Urinalysis:  Recent Labs Lab 03/02/15 1544  COLORURINE AMBER*  LABSPEC 1.012  PHURINE 7.0  GLUCOSEU NEGATIVE  HGBUR NEGATIVE  BILIRUBINUR NEGATIVE  KETONESUR NEGATIVE  PROTEINUR NEGATIVE  UROBILINOGEN 1.0  NITRITE NEGATIVE  LEUKOCYTESUR NEGATIVE    Micro Results: No results found for this or any previous visit (from the past 240 hour(s)). Studies/Results: Dg Thoracic Spine 2 View  03/02/2015   CLINICAL DATA:  Fall yesterday with thoracic pain. Initial encounter.  EXAM: THORACIC SPINE - 2-3 VIEWS  COMPARISON:  04/04/2010 and prior chest radiograph  FINDINGS: There is no evidence of acute fracture or subluxation.  Mild degenerative disc disease at T7-T8 again noted.  No focal bony lesions are identified.  Anterior fusion changes in the lower cervical spine are again identified.  IMPRESSION: No acute abnormalities.   Electronically Signed   By: Harmon Pier M.D.   On: 03/02/2015 14:05   Dg Lumbar Spine Complete  03/02/2015   CLINICAL DATA:  Patient status post fall 2 days prior. Back pain. Initial encounter.  EXAM: LUMBAR SPINE - COMPLETE 4+ VIEW  COMPARISON:  None.  FINDINGS: Normal anatomic alignment. Mild anterior height loss of the L1 vertebral body. L1-2 degenerative  disc disease with anterior endplate osteophytosis. Lower lumbar spine facet degenerative changes. The SI joints are unremarkable. Pelvic phleboliths. T11-T12 degenerative disc disease.  IMPRESSION: Mild anterior height loss of the L1 vertebral body, which appears to have been present on MR 01/22/2007. Recommend correlation for point tenderness.  L1-2 degenerative disc disease.   Electronically Signed   By: Annia Belt M.D.   On: 03/02/2015 14:10   Ct Head Wo Contrast  03/02/2015   CLINICAL DATA:  Patient status post fall 2 days prior. Patient hit the back of her head. No loss of consciousness.   EXAM: CT HEAD WITHOUT CONTRAST  CT CERVICAL SPINE WITHOUT CONTRAST  TECHNIQUE: Multidetector CT imaging of the head and cervical spine was performed following the standard protocol without intravenous contrast. Multiplanar CT image reconstructions of the cervical spine were also generated.  COMPARISON:  Brain CT 07/31/2011  FINDINGS: CT HEAD FINDINGS  Ventricles and sulci are appropriate for patient's age. Overlying the right cerebral convexity there is isodense fluid measuring up to 5 mm. There is approximately 2 mm of right to left midline shift. No evidence for acute cortically based infarct. Periventricular and subcortical white matter hypodensity compatible with chronic small vessel ischemic changes. Orbits are unremarkable. Mucosal thickening left maxillary sinus. Remainder the paranasal sinuses are unremarkable. Mastoid air cells are well aerated. There is a nondisplaced fracture through the posterior left calvarium extending to the skullbase, possibly involving the left occipital condyle.  CT CERVICAL SPINE FINDINGS  Patient status post anterior plate and screw fixation with fusion of C5, C6 and C7 vertebral bodies. There is degenerative disc disease with mild retrolisthesis of C4 on C5. Bulky anterior osteophytes at C4 level. Craniocervical junction is intact. Focal kyphosis C4-5 level. Lung apices are clear. Visualized thyroid is unremarkable.  IMPRESSION: Nondisplaced fracture through posterior left calvarium extending to the skullbase, possibly extending to left occipital condyle.  Isodense fluid measuring up to 5 mm overlying the right cerebral hemisphere with mild right to left midline shift. This may represent a chronic subdural hematoma or potentially an acute hygroma. This collection was not present on prior CT 12/17/2007. Recommend correlation for interval history.  Anterior cervical spinal fusion hardware from the C5-C7 levels with associated C4-5 degenerative disc disease and focal kyphosis at  this level. If there is concern for ligamentous injury at this location consider MRI.  No definite evidence for acute cervical spine fracture.  Chronic small vessel ischemic change.  Critical Value/emergent results were called by telephone at the time of interpretation on 03/02/2015 at 1:25 pm to Dr. Glynn Octave , who verbally acknowledged these results.   Electronically Signed   By: Annia Belt M.D.   On: 03/02/2015 13:38   Ct Cervical Spine Wo Contrast  03/02/2015   CLINICAL DATA:  Patient status post fall 2 days prior. Patient hit the back of her head. No loss of consciousness.  EXAM: CT HEAD WITHOUT CONTRAST  CT CERVICAL SPINE WITHOUT CONTRAST  TECHNIQUE: Multidetector CT imaging of the head and cervical spine was performed following the standard protocol without intravenous contrast. Multiplanar CT image reconstructions of the cervical spine were also generated.  COMPARISON:  Brain CT 07/31/2011  FINDINGS: CT HEAD FINDINGS  Ventricles and sulci are appropriate for patient's age. Overlying the right cerebral convexity there is isodense fluid measuring up to 5 mm. There is approximately 2 mm of right to left midline shift. No evidence for acute cortically based infarct. Periventricular and subcortical white matter hypodensity compatible with chronic  small vessel ischemic changes. Orbits are unremarkable. Mucosal thickening left maxillary sinus. Remainder the paranasal sinuses are unremarkable. Mastoid air cells are well aerated. There is a nondisplaced fracture through the posterior left calvarium extending to the skullbase, possibly involving the left occipital condyle.  CT CERVICAL SPINE FINDINGS  Patient status post anterior plate and screw fixation with fusion of C5, C6 and C7 vertebral bodies. There is degenerative disc disease with mild retrolisthesis of C4 on C5. Bulky anterior osteophytes at C4 level. Craniocervical junction is intact. Focal kyphosis C4-5 level. Lung apices are clear. Visualized  thyroid is unremarkable.  IMPRESSION: Nondisplaced fracture through posterior left calvarium extending to the skullbase, possibly extending to left occipital condyle.  Isodense fluid measuring up to 5 mm overlying the right cerebral hemisphere with mild right to left midline shift. This may represent a chronic subdural hematoma or potentially an acute hygroma. This collection was not present on prior CT 12/17/2007. Recommend correlation for interval history.  Anterior cervical spinal fusion hardware from the C5-C7 levels with associated C4-5 degenerative disc disease and focal kyphosis at this level. If there is concern for ligamentous injury at this location consider MRI.  No definite evidence for acute cervical spine fracture.  Chronic small vessel ischemic change.  Critical Value/emergent results were called by telephone at the time of interpretation on 03/02/2015 at 1:25 pm to Dr. Glynn OctaveSTEPHEN RANCOUR , who verbally acknowledged these results.   Electronically Signed   By: Annia Beltrew  Davis M.D.   On: 03/02/2015 13:38   Medications: I have reviewed the patient's current medications. Scheduled Meds: . enoxaparin (LOVENOX) injection  40 mg Subcutaneous Q24H  . folic acid  1 mg Oral Daily  . pravastatin  20 mg Oral QHS  . sertraline  200 mg Oral Daily  . sodium chloride  3 mL Intravenous Q12H  . thiamine  100 mg Oral Daily   Continuous Infusions:   PRN Meds:.acetaminophen, HYDROcodone-acetaminophen, promethazine Assessment/Plan: Active Problems:   Skull fracture, non depressed   Frequent falls  Frequent falls: history of alcohol abuse but denies recent heavy alcohol use. She reports problems with gait as her legs become acutely weak prior to her falls. Positive orthostatics on admission- BP 130/79 lying, 126/72 sitting, and 103/30 standing. EKG unchanged in AM. TSH normal at 4.473. UDS unremarkable. She has many factors that contribute to orthostatic hypotension including: alcohol, sertraline, amitriptyline,  donepezil. Her antihypertensives can cause this as well but she reports not taking them. 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 PT/OT recommending SNF. Orthostatic vitals today with BP 129/58 lying and 109/81 standing. -Echo pending -Discontinue NS @ 125 ml/hr -Hold home donepezil 10mg  QHS. She will need to be evaluated by outpatient psych or her primary care for adjustment of her mental health regimen. -Thiamine and folate supplementation -compression hose -Meclizine 25mg  BID prn dizziness  Skull fracture: CT head/c-spine with nondisplaced fracture through posterior left calvarium extending to the skull base and isodense fluid overlying the right cerebral hemisphere with mild right to left midline shift. Findings discussed with Dr. Mikal Planeabell by ED. He does not recommend neurosurgical intervention. No neurologic changes. Headache and nausea likely post-concussive. -Ibuprofen 600mg  Q6hr prn HA, moderate pain -Norco 5/325mg  q 12 hr prn severe pain -Neurochecks q4hr  AKI: Creatinine on admission 1.28. Her baseline is around 0.9. Likely prerenal. Creatinine down to 0.89 today. Resolved.  Pre-diabetes: Hgb A1c 6.3  Macrocytic anemia: Hgb 10.2 with MCV 101.7. Previous baseline around 10. Vitamin B12 1352 and folate 24.5. Vitamin B12 likely  elevated in setting of liver dysfunction ash her AST is elevated in 2:1 pattern consistent with alcoholic liver disease.  Thrombocytopenia: Platelets 144 on admission. Previous platelet 142 08/13/2011. May be secondary to alcohol abuse history. -Continue to monitor.  Hypertension: Not on any medications at home. -Continue to monitor.  Major depression with history of psychosis:  -Hold home amitriptyline  BID and donepezil  QHS. -Continue home sertraline  daily  FEN: General diet -Hypomagnesemia: repleted with IV MgS  VTE ppx: Lovenox  Dispo: Disposition is deferred at this time, awaiting improvement of current medical problems.   Anticipated discharge in approximately 0-1 day(s).   The patient does have a current PCP (No primary care provider on file.) and does not need an [facility] hospital follow-up appointment after discharge.  The patient does not have transportation limitations that hinder transportation to clinic appointments.  .Services Needed at time of discharge: Y = Yes, Blank = No PT:   OT:   RN:   Equipment:   Other:     LOS: 2 days   Lora Paula, MD 03/04/2015, 11:48 AM

## 2015-03-04 NOTE — Progress Notes (Signed)
Pt c/o nausea and then vomited up a small amount of bright blue emesis while RN was in the room.  Pt denied ingesting any meds or food that would cause this discoloration.  Room inspected for any source, nothing found.  MD paged.

## 2015-03-04 NOTE — Progress Notes (Signed)
Per pt father, pt drinks heavily on a daily basis and has for several years.  Pt has attempted detox programs in the past without success.  MD notified.

## 2015-03-04 NOTE — Progress Notes (Signed)
Patient seen and examined. Case d/w residents in detail. I agree with findings and plan as documented in Dr. Joaquin MusicKrall's note.  Patient still with mild nausea today. No fevers. Falls likely multifactorial- orthostasis, deconditioning, ETOH use and possibly meds as well. Will d/c IVF. PT recommending SNF placement. AKI resolved with IVF likely prerenal and neurosurgery with no acute intervention for skull fracture. She is stable for d/c to SNF once bed available

## 2015-03-04 NOTE — Progress Notes (Signed)
Chaplain initiated visit with pt and family. Pt husband also in the hospital at this time. Pt mother indicated pt had wanted to speak with a chaplain; pt did not seem to want to at this time. Chaplain informed pt to ask nurse to page chaplain as needed.    03/04/15 1400  Clinical Encounter Type  Visited With Patient and family together  Visit Type Initial;Spiritual support  Spiritual Encounters  Spiritual Needs Emotional;Prayer  Jiles HaroldStamey, Jahmere Bramel F, Chaplain 03/04/2015 2:17 PM

## 2015-03-04 NOTE — Progress Notes (Signed)
  Echocardiogram 2D Echocardiogram has been performed.  Vanessa SavoyCasey N Mekisha Hoffman 03/04/2015, 4:55 PM

## 2015-03-04 NOTE — Care Management (Signed)
Important Message  Patient Details  Name: Vanessa Mainlandatricia J Murdock MRN: [id number] Date of Birth: 07-28-1956   Medicare Important Message Given:  Yes-second notification given    Orson AloeMegan P Cayden Rautio 03/04/2015, 3:28 PM

## 2015-03-04 NOTE — Progress Notes (Signed)
Physical Therapy Treatment Patient Details Name: Vanessa Hoffman MRN: [id number] DOB: Jan 13, 1956 Today's Date: 03/04/2015    History of Present Illness [name] is a 59 year old woman with history of hypertension, fibromyalgia, depression/anxiety presenting after fall. CT head/c-spine with nondisplaced fracture through posterior left calvarium extending to the skull base and isodense fluid overlying the right cerebral hemisphere with mild right to left midline shift.    PT Comments    Patient self limited ambulation distance/participation in therapy today secondary to having pain "everywhere" and having dizziness. Tolerated short distance ambulation and grooming EOB. Pt requires constant support for balance during mobility to prevent falls. Cognition seems improved today. Will continue to follow to maximize independence and mobility. Appropriate for ST SNF.   Follow Up Recommendations  SNF;Supervision/Assistance - 24 hour     Equipment Recommendations  None recommended by PT    Recommendations for Other Services Speech consult     Precautions / Restrictions Precautions Precautions: Fall Restrictions Weight Bearing Restrictions: No    Mobility  Bed Mobility Overal bed mobility: Needs Assistance Bed Mobility: Supine to Sit;Sit to Supine     Supine to sit: Min assist Sit to supine: Supervision   General bed mobility comments: Pt reaching out for therapist's hand to provide assist to get to EOB. Able to return to supine without assist. + dizziness upon sitting EOB.  Transfers Overall transfer level: Needs assistance Equipment used: Rolling walker (2 wheeled) Transfers: Sit to/from Stand Sit to Stand: Min guard         General transfer comment: min guard assist for balance. Had to return to sitting due to dizziness upon standing. Stood x2 from EOB, from toilet x1.   Ambulation/Gait Ambulation/Gait assistance: Min assist Ambulation Distance (Feet): 20  Feet (x2 bouts) Assistive device: Rolling walker (2 wheeled) Gait Pattern/deviations: Step-through pattern;Decreased stride length;Wide base of support Gait velocity: decreased Gait velocity interpretation: Below normal speed for age/gender General Gait Details: Pt with severe hip/trunk flexion with RW too far anterior upon return from bathroom reporting back pain. Wide BoS. min A intermittently for balance/support and for RW management.    Stairs            Wheelchair Mobility    Modified Rankin (Stroke Patients Only)       Balance Overall balance assessment: Needs assistance Sitting-balance support: Feet supported;No upper extremity supported Sitting balance-Leahy Scale: Fair Sitting balance - Comments: Able to sit EOB performing grooming and ADLs without LOB or difficulty.    Standing balance support: During functional activity Standing balance-Leahy Scale: Poor Standing balance comment: Relient on RW for support.                     Cognition Arousal/Alertness: Awake/alert Behavior During Therapy: WFL for tasks assessed/performed Overall Cognitive Status: No family/caregiver present to determine baseline cognitive functioning         Following Commands: Follows multi-step commands with increased time            Exercises Other Exercises Other Exercises: Able to scoot along side bed x3    General Comments        Pertinent Vitals/Pain Pain Assessment: Faces Faces Pain Scale: Hurts even more Pain Location: "everywhere" Pain Descriptors / Indicators: Sore;Aching;Headache Pain Intervention(s): Monitored during session;Repositioned;Premedicated before session    Home Living                      Prior Function  PT Goals (current goals can now be found in the care plan section) Progress towards PT goals: Not progressing toward goals - comment (self limiting due to pain/dizziness.)    Frequency  Min 3X/week    PT Plan  Current plan remains appropriate    Co-evaluation             End of Session Equipment Utilized During Treatment: Gait belt Activity Tolerance: Patient limited by pain;Patient limited by fatigue Patient left: in bed;with call bell/phone within reach;with bed alarm set     Time: 1042-1100 PT Time Calculation (min) (ACUTE ONLY): 18 min  Charges:  $Therapeutic Activity: 8-22 mins                    G Codes:      Vanessa Hoffman 03/04/2015, 11:13 AM Mylo Red, PT, DPT [phone]

## 2015-03-04 NOTE — Clinical Social Work Note (Signed)
CSW Consult Acknowledged:   CSW received a consult for SNF placement. Full assessment to come.     Ima Hafner, MSW, LCSWA [phone]

## 2015-03-04 NOTE — Clinical Social Work Placement (Signed)
   CLINICAL SOCIAL WORK PLACEMENT  NOTE  Date:  03/04/2015  Patient Details  Name: Vanessa Hoffman MRN: [id number] Date of Birth: October 22, 1955  Clinical Social Work is seeking post-discharge placement for this patient at the Skilled  Nursing Facility level of care (*CSW will initial, date and re-position this form in  chart as items are completed):  Yes   Patient/family provided with [place] Clinical Social Work Department's list of facilities offering this level of care within the geographic area requested by the patient (or if unable, by the patient's family).  Yes   Patient/family informed of their freedom to choose among providers that offer the needed level of care, that participate in Medicare, Medicaid or managed care program needed by the patient, have an available bed and are willing to accept the patient.  Yes   Patient/family informed of [facility]'s ownership interest [facility] and [facility], as well as of the fact that they are under no obligation to receive care at these facilities.  PASRR submitted to EDS on 03/04/15     PASRR number received on 03/04/15     Existing PASRR number confirmed on       FL2 transmitted to all facilities in geographic area requested by pt/family on 03/04/15     FL2 transmitted to all facilities within larger geographic area on       Patient informed that his/her managed care company has contracts with or will negotiate with certain facilities, including the following:            Patient/family informed of bed offers received.  Patient chooses bed at       Physician recommends and patient chooses bed at      Patient to be transferred to   on  .  Patient to be transferred to facility by       Patient family notified on   of transfer.  Name of family member notified:        PHYSICIAN       Additional Comment:    _______________________________________________ Gwynne EdingerBibbs, Yezenia Fredrick, LCSW 03/04/2015, 2:13 PM

## 2015-03-05 LAB — CBC
HEMATOCRIT: 29 % — AB (ref 36.0–46.0)
Hemoglobin: 9.8 g/dL — ABNORMAL LOW (ref 12.0–15.0)
MCH: 33.8 pg (ref 26.0–34.0)
MCHC: 33.8 g/dL (ref 30.0–36.0)
MCV: 100 fL (ref 78.0–100.0)
Platelets: 130 10*3/uL — ABNORMAL LOW (ref 150–400)
RBC: 2.9 MIL/uL — ABNORMAL LOW (ref 3.87–5.11)
RDW: 15 % (ref 11.5–15.5)
WBC: 10.3 10*3/uL (ref 4.0–10.5)

## 2015-03-05 LAB — BASIC METABOLIC PANEL
Anion gap: 7 (ref 5–15)
BUN: 8 mg/dL (ref 6–20)
CO2: 22 mmol/L (ref 22–32)
Calcium: 7.9 mg/dL — ABNORMAL LOW (ref 8.9–10.3)
Chloride: 106 mmol/L (ref 101–111)
Creatinine, Ser: 0.91 mg/dL (ref 0.44–1.00)
GFR calc non Af Amer: >60 mL/min (ref >60)
Glucose, Bld: 113 mg/dL — ABNORMAL HIGH (ref 65–99)
Potassium: 3.8 mmol/L (ref 3.5–5.1)
Sodium: 135 mmol/L (ref 135–145)

## 2015-03-05 LAB — MAGNESIUM: Magnesium: 2 mg/dL (ref 1.7–2.4)

## 2015-03-05 LAB — PHOSPHORUS: Phosphorus: 2.4 mg/dL — ABNORMAL LOW (ref 2.5–4.6)

## 2015-03-05 MED ORDER — HYDROCODONE-ACETAMINOPHEN 5-325 MG PO TABS: 1.0000 | ORAL_TABLET | Freq: Two times a day (BID) | ORAL | Status: DC | PRN

## 2015-03-05 MED ORDER — FOLIC ACID 1 MG PO TABS: 1.0000 mg | ORAL_TABLET | Freq: Every day | ORAL | Status: AC

## 2015-03-05 MED ORDER — IBUPROFEN 600 MG PO TABS: 600.0000 mg | ORAL_TABLET | Freq: Four times a day (QID) | ORAL | Status: DC | PRN

## 2015-03-05 MED ORDER — THIAMINE HCL 100 MG PO TABS: 100.0000 mg | ORAL_TABLET | Freq: Every day | ORAL | Status: AC

## 2015-03-05 MED ORDER — MECLIZINE HCL 25 MG PO TABS: 25.0000 mg | ORAL_TABLET | Freq: Two times a day (BID) | ORAL | Status: DC | PRN

## 2015-03-05 MED ORDER — PROMETHAZINE HCL 25 MG PO TABS: 25.0000 mg | ORAL_TABLET | Freq: Four times a day (QID) | ORAL | Status: DC | PRN

## 2015-03-05 NOTE — Progress Notes (Signed)
Patient seen and examined. Case d/w residents in detail. I agree with findings and plan as documented in Dr. Joaquin MusicKrall's note.  Patient feels better and is tolerating PO. 2 D ECHO noted - bright area in basal inferior lateral wall of uncertain significance. Patient may need cardiac MRI as outpatient to further assess, Patient to be discharged to SNF today. No further w/u for now

## 2015-03-05 NOTE — Progress Notes (Signed)
Subjective: No acute events overnight. Reports some headache. Tolerating PO intake.  Objective: Vital signs in last 24 hours: Filed Vitals:   03/04/15 2124 03/05/15 0136 03/05/15 0534 03/05/15 0927  BP: 131/64 141/59 133/77 113/53  Pulse: 90 96 92 97  Temp: 98.4 F (36.9 C) 98.3 F (36.8 C) 97.6 F (36.4 C) 98.2 F (36.8 C)  TempSrc: Oral Oral Oral Oral  Resp: 20 20 20 20   Height:      Weight:      SpO2: 100% 100% 99% 97%   Weight change:   Intake/Output Summary (Last 24 hours) at 03/05/15 1058 Last data filed at 03/04/15 1759  Gross per 24 hour  Intake    240 ml  Output      0 ml  Net    240 ml   General Apperance: NAD HEENT: Posterior scalp hematoma, PERRL, EOMI, anicteric sclera Neck: Supple, trachea midline Lungs: Clear to auscultation bilaterally. No wheezes, rhonchi or rales. Breathing comfortably Heart: Regular rate and rhythm, no murmur/rub/gallop Abdomen: Soft, nontender, nondistended, no rebound/guarding Extremities: Normal, atraumatic, warm and well perfused, no edema Pulses: 2+ throughout Skin: No rashes or lesions Neurologic: Alert and oriented x 3. CNII-XII intact. Normal strength and sensation  Lab Results: Basic Metabolic Panel:  Recent Labs Lab 03/04/15 0628 03/05/15 0522  NA 135 135  K 4.0 3.8  CL 106 106  CO2 22 22  GLUCOSE 140* 113*  BUN 7 8  CREATININE 0.89 0.91  CALCIUM 7.3* 7.9*  MG 1.5* 2.0  PHOS  --  2.4*   CBC:  Recent Labs Lab 03/04/15 0628 03/05/15 0522  WBC 9.6 10.3  HGB 10.1* 9.8*  HCT 30.6* 29.0*  MCV 100.0 100.0  PLT 138* 130*   Cardiac Enzymes:  Recent Labs Lab 03/02/15 1213  TROPONINI <0.03   CBG:  Recent Labs Lab 03/02/15 1211  GLUCAP 117*   Thyroid Function Tests:  Recent Labs Lab 03/03/15 0539  TSH 4.473   Urine Drug Screen: Drugs of Abuse     Component Value Date/Time   LABOPIA NONE DETECTED 03/02/2015 1544   COCAINSCRNUR NONE DETECTED 03/02/2015 1544   LABBENZ NONE DETECTED  03/02/2015 1544   AMPHETMU NONE DETECTED 03/02/2015 1544   THCU NONE DETECTED 03/02/2015 1544   LABBARB NONE DETECTED 03/02/2015 1544    Urinalysis:  Recent Labs Lab 03/02/15 1544  COLORURINE AMBER*  LABSPEC 1.012  PHURINE 7.0  GLUCOSEU NEGATIVE  HGBUR NEGATIVE  BILIRUBINUR NEGATIVE  KETONESUR NEGATIVE  PROTEINUR NEGATIVE  UROBILINOGEN 1.0  NITRITE NEGATIVE  LEUKOCYTESUR NEGATIVE    Medications: I have reviewed the patient's current medications. Scheduled Meds: . amitriptyline  50 mg Oral BID  . enoxaparin (LOVENOX) injection  40 mg Subcutaneous Q24H  . folic acid  1 mg Oral Daily  . pravastatin  20 mg Oral QHS  . sertraline  200 mg Oral Daily  . sodium chloride  3 mL Intravenous Q12H  . thiamine  100 mg Oral Daily    PRN Meds:.HYDROcodone-acetaminophen, ibuprofen, meclizine, promethazine Assessment/Plan: Active Problems:   Skull fracture, non depressed   Frequent falls  Frequent falls: history of alcohol abuse but denies recent heavy alcohol use. Positive orthostatics on admission- BP 130/79 lying, 126/72 sitting, and 103/30 standing. She has many factors that contribute to orthostatic hypotension including: alcohol, sertraline, amitriptyline, donepezil. Her antihypertensives can cause this as well but she reports not taking them. 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 PT/OT recommending SNF. Echo with LV EF: 55-60%, grade 1 diastolic  dysfunction. She has bright area in basal inferior lateral wall of uncertain significance. Will recommend consideration of outpatient cardiac MRI to further assess. -Hold home donepezil  QHS. She will need to be evaluated by outpatient psych or her primary care for adjustment of her mental health regimen. -Thiamine and folate supplementation -compression hose -Meclizine  BID prn dizziness  Skull fracture: CT head/c-spine with nondisplaced fracture through posterior left calvarium extending to the skull base and isodense  fluid overlying the right cerebral hemisphere with mild right to left midline shift. Findings discussed with Dr. Mikal Plane by ED. He does not recommend neurosurgical intervention. No neurologic changes. Headache and nausea likely post-concussive. -Ibuprofen  Q6hr prn HA, moderate pain -Norco 5/325mg  q 12 hr prn severe pain -Neurochecks q4hr  Pre-diabetes: Hgb A1c 6.3  Macrocytic anemia: Hgb 10.2 with MCV 101.7. Previous baseline around 10. Vitamin B12 1352 and folate 24.5. Vitamin B12 likely elevated in setting of liver dysfunction ash her AST is elevated in 2:1 pattern consistent with alcoholic liver disease.  Thrombocytopenia: Platelets 144 on admission. Previous platelet 142 08/13/2011. May be secondary to alcohol abuse history. -Continue to monitor.  Hypertension: Not on any medications at home. -Continue to monitor.  Major depression with history of psychosis:  -Hold home donepezil  QHS. -Continue home sertraline  daily and amitriptyline  BID  FEN: General diet  VTE ppx: Lovenox  Dispo: Likely d/c to SNF today.  The patient does have a current PCP (No primary care provider on file.) and does not need an [facility] hospital follow-up appointment after discharge.  The patient does not have transportation limitations that hinder transportation to clinic appointments.  .Services Needed at time of discharge: Y = Yes, Blank = No PT:   OT:   RN:   Equipment:   Other:     LOS: 3 days   Lora Paula, MD 03/05/2015, 10:58 AM

## 2015-03-05 NOTE — Progress Notes (Signed)
Discharge orders received. Pt notified and verbalized understanding. IV and tele removed. Pt dressed and belongings packed. Report called to [facility] RN. Pt given pain medication prior to discharge. [facility] arrived to transport pt.

## 2015-03-05 NOTE — Progress Notes (Signed)
Occupational Therapy Treatment Patient Details Name: Vanessa Hoffman MRN: [id number] DOB: Nov 16, 1955 Today's Date: 03/05/2015    History of present illness [name] is a 59 year old woman with history of hypertension, fibromyalgia, depression/anxiety presenting after fall. CT head/c-spine with nondisplaced fracture through posterior left calvarium extending to the skull base and isodense fluid overlying the right cerebral hemisphere with mild right to left midline shift.   OT comments  Pt progressing towards acute OT goals. Pt completed ADLs as detailed below. Session limited by dizziness OOB as detailed below. D/c plan remains appropriate.  Follow Up Recommendations  SNF    Equipment Recommendations  None recommended by OT    Recommendations for Other Services      Precautions / Restrictions Precautions Precautions: Fall Restrictions Weight Bearing Restrictions: No       Mobility Bed Mobility Overal bed mobility: Needs Assistance Bed Mobility: Supine to Sit;Sit to Supine     Supine to sit: Min assist Sit to supine: Min guard   General bed mobility comments: min A for powerup to sitting position. Min guard for EOB>supine second to dizziness. Cues during functional mobility for technique with rw.  Transfers Overall transfer level: Needs assistance Equipment used: Rolling walker (2 wheeled) Transfers: Sit to/from Stand Sit to Stand: Min guard         General transfer comment: min guard for safety. cues for technique with rw.    Balance Overall balance assessment: Needs assistance;History of Falls         Standing balance support: Bilateral upper extremity supported;During functional activity Standing balance-Leahy Scale: Poor Standing balance comment: rw for balance                   ADL Overall ADL's : Needs assistance/impaired     Grooming: Oral care;Wash/dry hands;Min guard;Sitting Grooming Details (indicate cue type and reason):  Pt reported increased dizziness after ambulating to bathroom. Cued pt to sit for oral care. Pt stated,"this is the kind of diizziness I''ve had when I've fallen." Cues to stay in seated position. Pt declined SPT to recliner; ambulated with chair follow to return to bed.                  Toilet Transfer: Min guard;Ambulation;BSC;RW Toilet Transfer Details (indicate cue type and reason): min guard for balance         Functional mobility during ADLs: Min guard;Rolling walker General ADL Comments: Pt needing cues second to cognition for ADL tasks. Pt with increased dizziness during ambulation. Pt completed oral care sitting at sink. Pt reported increased dizziness sitting EOB. Rest break provided before attempting sit<>stand.      Vision                     Perception     Praxis      Cognition   Behavior During Therapy: Vanessa Hoffman for tasks assessed/performed Overall Cognitive Status: No family/caregiver present to determine baseline cognitive functioning Area of Impairment: Orientation;Attention;Memory;Safety/judgement;Following commands;Problem solving Orientation Level: Time ("2015") Current Attention Level: Sustained Memory: Decreased short-term memory  Following Commands: Follows multi-step commands with increased time;Follows multi-step commands inconsistently Safety/Judgement: Decreased awareness of safety;Decreased awareness of deficits   Problem Solving: Slow processing;Difficulty sequencing;Requires verbal cues General Comments: Pt appears internally distracted. Pt stating that her mom had just helped her with getting "the weird thoughts out of my head." When asked to elaborate pt stated she has a hard time controlling her emotions sometimes.  Extremity/Trunk Assessment               Exercises     Shoulder Instructions       General Comments      Pertinent Vitals/ Pain       Pain Assessment: Faces Faces Pain Scale: Hurts even more Pain Location:  posterior head Pain Intervention(s): Limited activity within patient's tolerance;Monitored during session;Repositioned  Home Living                                          Prior Functioning/Environment              Frequency Min 2X/week     Progress Toward Goals  OT Goals(current goals can now be found in the care plan section)  Progress towards OT goals: Progressing toward goals  Acute Rehab OT Goals Patient Stated Goal: did not state  OT Goal Formulation: With patient Time For Goal Achievement: 03/17/15 Potential to Achieve Goals: Good ADL Goals Pt Will Perform Grooming: with supervision;standing Pt Will Perform Lower Body Bathing: with supervision;sit to/from stand Pt Will Perform Lower Body Dressing: with supervision;sit to/from stand Pt Will Transfer to Toilet: with supervision;ambulating;regular height toilet;bedside commode;grab bars Pt Will Perform Toileting - Clothing Manipulation and hygiene: with supervision;sit to/from stand  Plan Discharge plan remains appropriate    Co-evaluation                 End of Session Equipment Utilized During Treatment: Gait belt;Rolling walker   Activity Tolerance Other (comment) (dizzness OOB)   Patient Left in bed;with call bell/phone within reach;with bed alarm set   Nurse Communication Other (comment) (dizziness OOB)        Time: 1235-1300 OT Time Calculation (min): 25 min  Charges: OT General Charges $OT Visit: 1 Procedure OT Treatments $Self Care/Home Management : 23-37 mins  Pilar GrammesMathews, Symon Norwood H 03/05/2015, 1:25 PM

## 2015-03-05 NOTE — Discharge Instructions (Signed)
Head Injury  You have a head injury. Headaches and throwing up (vomiting) are common after a head injury. It should be easy to wake up from sleeping. Sometimes you must stay in the hospital. Most problems happen within the first 24 hours. Side effects may occur up to 7-10 days after the injury.   WHAT ARE THE TYPES OF HEAD INJURIES?  Head injuries can be as minor as a bump. Some head injuries can be more severe. More severe head injuries include:  · A jarring injury to the brain (concussion).  · A bruise of the brain (contusion). This mean there is bleeding in the brain that can cause swelling.  · A cracked skull (skull fracture).  · Bleeding in the brain that collects, clots, and forms a bump (hematoma).  WHEN SHOULD I GET HELP RIGHT AWAY?   · You are confused or sleepy.  · You cannot be woken up.  · You feel sick to your stomach (nauseous) or keep throwing up (vomiting).  · Your dizziness or unsteadiness is getting worse.  · You have very bad, lasting headaches that are not helped by medicine. Take medicines only as told by your doctor.  · You cannot use your arms or legs like normal.  · You cannot walk.  · You notice changes in the black spots in the center of the colored part of your eye (pupil).  · You have clear or bloody fluid coming from your nose or ears.  · You have trouble seeing.  During the next 24 hours after the injury, you must stay with someone who can watch you. This person should get help right away (call 911 in the [place] if you start to shake and are not able to control it (have seizures), you pass out, or you are unable to wake up.  HOW CAN I PREVENT A HEAD INJURY IN THE FUTURE?  · Wear seat belts.  · Wear a helmet while bike riding and playing sports like football.  · Stay away from dangerous activities around the house.  WHEN CAN I RETURN TO NORMAL ACTIVITIES AND ATHLETICS?  See your doctor before doing these activities. You should not do normal activities or play contact sports until 1 week  after the following symptoms have stopped:  · Headache that does not go away.  · Dizziness.  · Poor attention.  · Confusion.  · Memory problems.  · Sickness to your stomach or throwing up.  · Tiredness.  · Fussiness.  · Bothered by bright lights or loud noises.  · Anxiousness or depression.  · Restless sleep.  MAKE SURE YOU:   · Understand these instructions.  · Will watch your condition.  · Will get help right away if you are not doing well or get worse.  Document Released: 07/23/2008 Document Revised: 12/25/2013 Document Reviewed: 04/17/2013  [employer]® Patient Information ©2015 [employer]. This information is not intended to replace advice given to you by your health care provider. Make sure you discuss any questions you have with your health care provider.    Fall Prevention and Home Safety  Falls cause injuries and can affect all age groups. It is possible to prevent falls.   HOW TO PREVENT FALLS  · Wear shoes with rubber soles that do not have an opening for your toes.  · Keep the inside and outside of your house well lit.  · Use night lights throughout your home.  · Remove clutter from floors.  · Clean up floor   spills.  · Remove throw rugs or fasten them to the floor with carpet tape.  · Do not place electrical cords across pathways.  · Put grab bars by your tub, shower, and toilet. Do not use towel bars as grab bars.  · Put handrails on both sides of the stairway. Fix loose handrails.  · Do not climb on stools or stepladders, if possible.  · Do not wax your floors.  · Repair uneven or unsafe sidewalks, walkways, or stairs.  · Keep items you use a lot within reach.  · Be aware of pets.  · Keep emergency numbers next to the telephone.  · Put smoke detectors in your home and near bedrooms.  Ask your doctor what other things you can do to prevent falls.  Document Released: 06/06/2009 Document Revised: 02/09/2012 Document Reviewed: 11/10/2011  [employer]® Patient Information ©2015 [employer]. This information  is not intended to replace advice given to you by your health care provider. Make sure you discuss any questions you have with your health care provider.

## 2015-03-05 NOTE — Discharge Summary (Signed)
Name: Vanessa Hoffman MRN: [id number] DOB: [birth year] 59 y.o. PCP: No primary care provider on file.  Date of Admission: 03/02/2015 11:58 AM Date of Discharge: 03/05/2015 Attending Physician: Earl Lagos, MD  Discharge Diagnosis: Principal Problem:   Skull fracture, non depressed Active Problems:   Major depressive disorder, recurrent, severe with psychotic features   Frequent falls  Discharge Medications:   Medication List    STOP taking these medications        donepezil 10 MG tablet  Commonly known as:  ARICEPT     propranolol 20 MG tablet  Commonly known as:  INDERAL     valsartan-hydrochlorothiazide 160-12.5 MG per tablet  Commonly known as:  DIOVAN-HCT      TAKE these medications        amitriptyline 50 MG tablet  Commonly known as:  ELAVIL  Take 50 mg by mouth 2 (two) times daily.     folic acid 1 MG tablet  Commonly known as:  FOLVITE  Take 1 tablet (1 mg total) by mouth daily.     HYDROcodone-acetaminophen 5-325 MG per tablet  Commonly known as:  NORCO/VICODIN  Take 1 tablet by mouth every 12 (twelve) hours as needed for severe pain.     ibuprofen 600 MG tablet  Commonly known as:  ADVIL,MOTRIN  Take 1 tablet (600 mg total) by mouth every 6 (six) hours as needed for headache or moderate pain.     meclizine 25 MG tablet  Commonly known as:  ANTIVERT  Take 1 tablet (25 mg total) by mouth 2 (two) times daily as needed for dizziness.     multivitamins with iron Tabs tablet  Take 1 tablet by mouth daily. Vitamin supplement.     pravastatin 20 MG tablet  Commonly known as:  PRAVACHOL  Take 1 tablet (20 mg total) by mouth at bedtime. For cholesterol management.     promethazine 25 MG tablet  Commonly known as:  PHENERGAN  Take 1 tablet (25 mg total) by mouth every 6 (six) hours as needed for nausea or vomiting.     sertraline 100 MG tablet  Commonly known as:  ZOLOFT  Take 200 mg by mouth daily.     thiamine 100 MG tablet  Take 1 tablet  (100 mg total) by mouth daily.        Disposition and follow-up:   [name] was discharged from [facility] in Stable condition.  At the hospital follow up visit please address:  1.  Frequent falls: She has many factors that contribute to orthostatic hypotension including: alcohol, sertraline, amitriptyline, donepezil. Her antihypertensives can cause this as well but she reports not taking them. 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   Major depression with history of psychosis: She was continued on her home sertraline 20 mg daily and amitriptyline 50 mg twice a day. Donepezil  QHS was held. She will need to be evaluated by outpatient psych or her primary care for adjustment of her mental health regimen.  2.  Labs / imaging needed at time of follow-up: none  3.  Pending labs/ test needing follow-up: none  Follow-up Appointments:     Follow-up Information    Schedule an appointment as soon as possible for a visit with Primary care provider.   Why:  For follow up after discharge from skilled nursing facility      Discharge Instructions: Discharge Instructions    Call  MD for:  difficulty breathing, headache or visual disturbances    Complete by:  As directed      Call MD for:  persistant dizziness or light-headedness    Complete by:  As directed      Call MD for:  persistant nausea and vomiting    Complete by:  As directed      Call MD for:  severe uncontrolled pain    Complete by:  As directed      Call MD for:  temperature >100.4    Complete by:  As directed      Diet - low sodium heart healthy    Complete by:  As directed      Increase activity slowly    Complete by:  As directed            Consultations:    Procedures Performed:  Dg Thoracic Spine 2 View  03/02/2015   CLINICAL DATA:  Fall yesterday with thoracic pain. Initial encounter.  EXAM: THORACIC SPINE -  2-3 VIEWS  COMPARISON:  04/04/2010 and prior chest radiograph  FINDINGS: There is no evidence of acute fracture or subluxation.  Mild degenerative disc disease at T7-T8 again noted.  No focal bony lesions are identified.  Anterior fusion changes in the lower cervical spine are again identified.  IMPRESSION: No acute abnormalities.   Electronically Signed   By: Harmon PierJeffrey  Hu M.D.   On: 03/02/2015 14:05   Dg Lumbar Spine Complete  03/02/2015   CLINICAL DATA:  Patient status post fall 2 days prior. Back pain. Initial encounter.  EXAM: LUMBAR SPINE - COMPLETE 4+ VIEW  COMPARISON:  None.  FINDINGS: Normal anatomic alignment. Mild anterior height loss of the L1 vertebral body. L1-2 degenerative disc disease with anterior endplate osteophytosis. Lower lumbar spine facet degenerative changes. The SI joints are unremarkable. Pelvic phleboliths. T11-T12 degenerative disc disease.  IMPRESSION: Mild anterior height loss of the L1 vertebral body, which appears to have been present on MR 01/22/2007. Recommend correlation for point tenderness.  L1-2 degenerative disc disease.   Electronically Signed   By: Annia Beltrew  Davis M.D.   On: 03/02/2015 14:10   Ct Head Wo Contrast  03/02/2015   CLINICAL DATA:  Patient status post fall 2 days prior. Patient hit the back of her head. No loss of consciousness.  EXAM: CT HEAD WITHOUT CONTRAST  CT CERVICAL SPINE WITHOUT CONTRAST  TECHNIQUE: Multidetector CT imaging of the head and cervical spine was performed following the standard protocol without intravenous contrast. Multiplanar CT image reconstructions of the cervical spine were also generated.  COMPARISON:  Brain CT 07/31/2011  FINDINGS: CT HEAD FINDINGS  Ventricles and sulci are appropriate for patient's age. Overlying the right cerebral convexity there is isodense fluid measuring up to 5 mm. There is approximately 2 mm of right to left midline shift. No evidence for acute cortically based infarct. Periventricular and subcortical white matter  hypodensity compatible with chronic small vessel ischemic changes. Orbits are unremarkable. Mucosal thickening left maxillary sinus. Remainder the paranasal sinuses are unremarkable. Mastoid air cells are well aerated. There is a nondisplaced fracture through the posterior left calvarium extending to the skullbase, possibly involving the left occipital condyle.  CT CERVICAL SPINE FINDINGS  Patient status post anterior plate and screw fixation with fusion of C5, C6 and C7 vertebral bodies. There is degenerative disc disease with mild retrolisthesis of C4 on C5. Bulky anterior osteophytes at C4 level. Craniocervical junction is intact. Focal kyphosis C4-5 level. Lung apices are clear.  Visualized thyroid is unremarkable.  IMPRESSION: Nondisplaced fracture through posterior left calvarium extending to the skullbase, possibly extending to left occipital condyle.  Isodense fluid measuring up to 5 mm overlying the right cerebral hemisphere with mild right to left midline shift. This may represent a chronic subdural hematoma or potentially an acute hygroma. This collection was not present on prior CT 12/17/2007. Recommend correlation for interval history.  Anterior cervical spinal fusion hardware from the C5-C7 levels with associated C4-5 degenerative disc disease and focal kyphosis at this level. If there is concern for ligamentous injury at this location consider MRI.  No definite evidence for acute cervical spine fracture.  Chronic small vessel ischemic change.  Critical Value/emergent results were called by telephone at the time of interpretation on 03/02/2015 at 1:25 pm to Dr. Glynn Octave , who verbally acknowledged these results.   Electronically Signed   By: Annia Belt M.D.   On: 03/02/2015 13:38   Ct Cervical Spine Wo Contrast  03/02/2015   CLINICAL DATA:  Patient status post fall 2 days prior. Patient hit the back of her head. No loss of consciousness.  EXAM: CT HEAD WITHOUT CONTRAST  CT CERVICAL SPINE WITHOUT  CONTRAST  TECHNIQUE: Multidetector CT imaging of the head and cervical spine was performed following the standard protocol without intravenous contrast. Multiplanar CT image reconstructions of the cervical spine were also generated.  COMPARISON:  Brain CT 07/31/2011  FINDINGS: CT HEAD FINDINGS  Ventricles and sulci are appropriate for patient's age. Overlying the right cerebral convexity there is isodense fluid measuring up to 5 mm. There is approximately 2 mm of right to left midline shift. No evidence for acute cortically based infarct. Periventricular and subcortical white matter hypodensity compatible with chronic small vessel ischemic changes. Orbits are unremarkable. Mucosal thickening left maxillary sinus. Remainder the paranasal sinuses are unremarkable. Mastoid air cells are well aerated. There is a nondisplaced fracture through the posterior left calvarium extending to the skullbase, possibly involving the left occipital condyle.  CT CERVICAL SPINE FINDINGS  Patient status post anterior plate and screw fixation with fusion of C5, C6 and C7 vertebral bodies. There is degenerative disc disease with mild retrolisthesis of C4 on C5. Bulky anterior osteophytes at C4 level. Craniocervical junction is intact. Focal kyphosis C4-5 level. Lung apices are clear. Visualized thyroid is unremarkable.  IMPRESSION: Nondisplaced fracture through posterior left calvarium extending to the skullbase, possibly extending to left occipital condyle.  Isodense fluid measuring up to 5 mm overlying the right cerebral hemisphere with mild right to left midline shift. This may represent a chronic subdural hematoma or potentially an acute hygroma. This collection was not present on prior CT 12/17/2007. Recommend correlation for interval history.  Anterior cervical spinal fusion hardware from the C5-C7 levels with associated C4-5 degenerative disc disease and focal kyphosis at this level. If there is concern for ligamentous injury at  this location consider MRI.  No definite evidence for acute cervical spine fracture.  Chronic small vessel ischemic change.  Critical Value/emergent results were called by telephone at the time of interpretation on 03/02/2015 at 1:25 pm to Dr. Glynn Octave , who verbally acknowledged these results.   Electronically Signed   By: Annia Belt M.D.   On: 03/02/2015 13:38    2D Echo:  Study Conclusions  - Left ventricle: The cavity size was normal. Wall thickness was normal. Systolic function was normal. The estimated ejection fraction was in the range of 55% to 60%. Wall motion was normal; there were  no regional wall motion abnormalities. Doppler parameters are consistent with abnormal left ventricular relaxation (grade 1 diastolic dysfunction). - Mitral valve: Calcified annulus.  Impressions:  - Normal LV function; grade 1 diastolic dysfunction; prominent density in pericardial space most likely represents fat; echo bright area in basal inferior lateral wall of uncertain significance; suggest cardiac MRI to further assess.  Transthoracic echocardiography. M-mode, complete 2D, spectral Doppler, and color Doppler. Birthdate: Patient birthdate: 09/21/1955. Age: Patient is 59 yr old. Sex: Gender: female. BMI: 31 kg/m^2. Blood pressure:   136/74 Patient status: Inpatient. Study date: Study date: 03/04/2015. Study time: 04:04 PM. Location: Echo laboratory.   Admission HPI: Ms. EAVAN GONTERMAN is a 59 year old woman with history of hypertension, fibromyalgia, depression/anxiety presenting after fall. She fell 1.5 days ago. She was walking to her car when she felt her legs collapse. It was sudden. Denies LOC. Denies paresthesias. She feels that her left leg may have been weaker at the time. Denies pain prior or during the collapse. She has chronic knee arthralgias. After falling she [place] is unable to get up on her own and has to crawl. It takes about an hour for her  to regain her baseline strength.  She has been having frequent falls for the past 2 years. She falls about 4 times a week. She normally walks with a rolling walker or cane. She occasionally feels dizzy prior to her falls. She has a history of alcohol abuse but denies heavy drinking in the past month. She denies illicit drugs.  No fevers or chills, change in her occasional dry cough, shortness of breath, chest pain, abdominal pain, diarrhea, hematochezia, melena, dysuria, hematuria, rash, edema. Some blurry vision, nausea and vomiting after the fall. Denies history of VTE.   Hospital Course by problem list: Principal Problem:   Skull fracture, non depressed Active Problems:   Major depressive disorder, recurrent, severe with psychotic features   Frequent falls   1. Frequent falls: history of alcohol abuse but denies recent heavy alcohol use. She reports problems with gait as her legs become acutely weak prior to her falls. T wave inversions in V2 seen on EKG but she denies chest pain and initial troponin negative. Positive orthostatics - BP 130/79 lying, 126/72 sitting, and 103/30 standing. No acute abnormalities on XR thoracic spine. XR lumbar spine with previously seen mild anterior height loss of the L1 vertebral body in L1-2 degenerative disc disease. CT findings discussed below. She was admitted for further evaluation. She was given IV fluids.  She remained orthostatic the following morning with BP lying 165/96 and standing 97/66. EKG unchanged in AM. TSH normal at 4.473. UDS unremarkable. She has many factors that contribute to orthostatic hypotension including: alcohol, sertraline, amitriptyline, donepezil. Her antihypertensives can cause this as well but she reports not taking them. 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 She was counseled on alcohol cessation. PT/OT recommended SNF. Echo was unremarkable and findings as  listed above. Her orthostatics improved prior to discharge with BP lying 129/58 and standing 109/81. She will need to be evaluated by outpatient psych or her primary care for adjustment of her mental health regimen.  2. Skull fracture: CT head/c-spine with nondisplaced fracture through posterior left calvarium extending to the skull base and isodense fluid overlying the right cerebral hemisphere with mild right to left midline shift. Findings discussed with Dr. Mikal Plane by ED. He does not recommend neurosurgical intervention. Her pain was controlled with pain  medications by mouth. Headache and nausea are likely secondary to concussion. She had no neurologic changes and remained stable.  3. Major depression with history of psychosis: She was continued on her home sertraline 20 mg daily and amitriptyline 50 mg twice a day. Donepezil 10mg  QHS was held. She will need to be evaluated by outpatient psych or her primary care for adjustment of her mental health regimen.   Discharge Vitals:   BP 113/53 mmHg  Pulse 97  Temp(Src) 98.2 F (36.8 C) (Oral)  Resp 20  Ht 5\' 3"  (1.6 m)  Wt 175 lb 1.6 oz (79.425 kg)  BMI 31.03 kg/m2  SpO2 97%  Discharge Labs:  Results for orders placed or performed during the hospital encounter of 03/02/15 (from the past 24 hour(s))  Basic metabolic panel     Status: Abnormal   Collection Time: 03/05/15  5:22 AM  Result Value Ref Range   Sodium 135 135 - 145 mmol/L   Potassium 3.8 3.5 - 5.1 mmol/L   Chloride 106 101 - 111 mmol/L   CO2 22 22 - 32 mmol/L   Glucose, Bld 113 (H) 65 - 99 mg/dL   BUN 8 6 - 20 mg/dL   Creatinine, Ser 1.61 0.44 - 1.00 mg/dL   Calcium 7.9 (L) 8.9 - 10.3 mg/dL   GFR calc non Af Amer >60 >60 mL/min   GFR calc Af Amer >60 >60 mL/min   Anion gap 7 5 - 15  CBC     Status: Abnormal   Collection Time: 03/05/15  5:22 AM  Result Value Ref Range   WBC 10.3 4.0 - 10.5 K/uL   RBC 2.90 (L) 3.87 - 5.11 MIL/uL   Hemoglobin 9.8 (L) 12.0 - 15.0 g/dL   HCT  09.6 (L) 04.5 - 46.0 %   MCV 100.0 78.0 - 100.0 fL   MCH 33.8 26.0 - 34.0 pg   MCHC 33.8 30.0 - 36.0 g/dL   RDW 40.9 81.1 - 91.4 %   Platelets 130 (L) 150 - 400 K/uL  Magnesium     Status: None   Collection Time: 03/05/15  5:22 AM  Result Value Ref Range   Magnesium 2.0 1.7 - 2.4 mg/dL  Phosphorus     Status: Abnormal   Collection Time: 03/05/15  5:22 AM  Result Value Ref Range   Phosphorus 2.4 (L) 2.5 - 4.6 mg/dL    Signed: Lora Paula, MD 03/05/2015, 11:53 AM    Services Ordered on Discharge: none Equipment Ordered on Discharge: none

## 2015-03-05 NOTE — Clinical Social Work Placement (Signed)
   CLINICAL SOCIAL WORK PLACEMENT  NOTE  Date:  03/05/2015  Patient Details  Name: Vanessa Hoffman MRN: [id number] Date of Birth: [birth year]  Clinical Social Work is seeking post-discharge placement for this patient at the Skilled  Nursing Facility level of care (*CSW will initial, date and re-position this form in  chart as items are completed):  Yes   Patient/family provided with [place] Clinical Social Work Department's list of facilities offering this level of care within the geographic area requested by the patient (or if unable, by the patient's family).  Yes   Patient/family informed of their freedom to choose among providers that offer the needed level of care, that participate in Medicare, Medicaid or managed care program needed by the patient, have an available bed and are willing to accept the patient.  Yes   Patient/family informed of [facility]'s ownership interest in [facility] and [facility], as well as of the fact that they are under no obligation to receive care at these facilities.  PASRR submitted to EDS on 03/04/15     PASRR number received on 03/04/15     Existing PASRR number confirmed on       FL2 transmitted to all facilities in geographic area requested by pt/family on 03/04/15     FL2 transmitted to all facilities within larger geographic area on       Patient informed that his/her managed care company has contracts with or will negotiate with certain facilities, including the following:        Yes   Patient/family informed of bed offers received.  [facility]     Physician recommends and patient chooses bed at      [facility] on 03/05/15.  Patient to be transferred to facility by [facility]      Patient family notified on 03/05/15 of transfer.  Name of family member notified:  [name], father      PHYSICIAN       Additional Comment:     _______________________________________________ Gwynne EdingerBibbs, Marwah Disbro, LCSW 03/05/2015, 12:17 PM

## 2015-03-05 NOTE — Progress Notes (Signed)
OT Cancellation Note  Patient Details Name: Vanessa Mainlandatricia J Lightcap MRN: [id number] DOB: 1956/04/18   Cancelled Treatment:    Reason Eval/Treat Not Completed: Pain limiting ability to participate. Pt reports 10/10 pain on posterior head.   Pilar GrammesMathews, Aracelie Addis H 03/05/2015, 10:51 AM

## 2015-03-06 ENCOUNTER — Non-Acute Institutional Stay (SKILLED_NURSING_FACILITY): Payer: Medicare Other | Admitting: Internal Medicine

## 2015-03-06 ENCOUNTER — Encounter: Payer: Self-pay | Admitting: Internal Medicine

## 2015-03-06 DIAGNOSIS — I10 Essential (primary) hypertension: Secondary | ICD-10-CM | POA: Insufficient documentation

## 2015-03-06 DIAGNOSIS — F1011 Alcohol abuse, in remission: Secondary | ICD-10-CM | POA: Insufficient documentation

## 2015-03-06 DIAGNOSIS — S0291XD Unspecified fracture of skull, subsequent encounter for fracture with routine healing: Secondary | ICD-10-CM | POA: Diagnosis not present

## 2015-03-06 DIAGNOSIS — F329 Major depressive disorder, single episode, unspecified: Secondary | ICD-10-CM | POA: Insufficient documentation

## 2015-03-06 DIAGNOSIS — F0151 Vascular dementia with behavioral disturbance: Secondary | ICD-10-CM

## 2015-03-06 DIAGNOSIS — F1010 Alcohol abuse, uncomplicated: Secondary | ICD-10-CM | POA: Diagnosis not present

## 2015-03-06 DIAGNOSIS — F333 Major depressive disorder, recurrent, severe with psychotic symptoms: Secondary | ICD-10-CM | POA: Diagnosis not present

## 2015-03-06 DIAGNOSIS — F0781 Postconcussional syndrome: Secondary | ICD-10-CM | POA: Diagnosis not present

## 2015-03-06 DIAGNOSIS — M1990 Unspecified osteoarthritis, unspecified site: Secondary | ICD-10-CM | POA: Diagnosis not present

## 2015-03-06 DIAGNOSIS — E785 Hyperlipidemia, unspecified: Secondary | ICD-10-CM | POA: Diagnosis not present

## 2015-03-06 DIAGNOSIS — F32A Depression, unspecified: Secondary | ICD-10-CM | POA: Insufficient documentation

## 2015-03-06 NOTE — Assessment & Plan Note (Signed)
Pt was on aricept prior to hospitalization , it was d/c in hospital but I'm restarting it because I feel like pt at high risk for decompensation psychologically.

## 2015-03-06 NOTE — Assessment & Plan Note (Addendum)
2/2 to a fall from standing. Unknown if LOC but most certainly with post concussive syndrome since she hit head hard enough to cause a fracture. PCS- difficulty concentrating and thinking were noted on exam. I an d/c norco. Pt already has AMS from concussion, already unstable on feet, already an addict, doesn't need narcotics.

## 2015-03-06 NOTE — Progress Notes (Addendum)
MRN: [id number] Name: Vanessa Hoffman  Sex: female Age: 59 y.o. DOB: 1955-09-14  [facility] #: [facility] Facility/Room:102 Level Of Care: SNF Provider: Merrilee Seashore D Emergency Contacts: Extended Emergency Contact Information Primary Emergency Contact: Rennis Harding [place] Home Phone: [phone] Relation: Father Secondary Emergency Contact: Scribmer,Marie Address: [address]          [place] [address] [place] Home Phone: [phone] Relation: Mother  Code Status:   Allergies: Review of patient's allergies indicates no known allergies.  Chief Complaint  Patient presents with  . New Admit To SNF    HPI: Patient is 59 y.o. female who is admitted to SNF for OT/Pt for frequent falls and gait abnormality after being  hospitalized 7/9-12 for  a fall from standing, with reported frequent falls, 4 X a week. 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 At SNF pt will be followed for depression with psychotic features, txed with elavil and zoloft, hyperlipidemia, txed with pravachol and dementia, txed ,with aricept recently held in hospital for low BP , but which I am restarting at SNF before she decompensates.  Past Medical History  Diagnosis Date  . Anxiety   . Fibromyalgia   . Osteoporosis   . Depression   . Hyperlipidemia   . Hypertension   . Substance abuse     ETOH, reportedly in past, l  . Arthritis   . DDD (degenerative disc disease)     Past Surgical History  Procedure Laterality Date  . Cervical disc surgery  2001    3 fusions  . Cesarean section      2      Medication List       This list is accurate as of: 03/06/15 11:59 PM.  Always use your most recent med list.               amitriptyline 50 MG tablet  Commonly known as:  ELAVIL  Take 50 mg by mouth 2 (two) times daily.      folic acid 1 MG tablet  Commonly known as:  FOLVITE  Take 1 tablet (1 mg total) by mouth daily.     HYDROcodone-acetaminophen 5-325 MG per tablet  Commonly known as:  NORCO/VICODIN  Take 1 tablet by mouth every 12 (twelve) hours as needed for severe pain.     ibuprofen 600 MG tablet  Commonly known as:  ADVIL,MOTRIN  Take 1 tablet (600 mg total) by mouth every 6 (six) hours as needed for headache or moderate pain.     meclizine 25 MG tablet  Commonly known as:  ANTIVERT  Take 1 tablet (25 mg total) by mouth 2 (two) times daily as needed for dizziness.     multivitamins with iron Tabs tablet  Take 1 tablet by mouth daily. Vitamin supplement.     pravastatin 20 MG tablet  Commonly known as:  PRAVACHOL  Take 1 tablet (20 mg total) by mouth at bedtime. For cholesterol management.     promethazine 25 MG tablet  Commonly known as:  PHENERGAN  Take 1 tablet (25 mg total) by mouth every 6 (six) hours as needed for nausea or vomiting.     sertraline 100 MG tablet  Commonly known as:  ZOLOFT  Take 200 mg by mouth daily.     thiamine 100 MG tablet  Take 1  tablet (100 mg total) by mouth daily.        No orders of the defined types were placed in this encounter.    There is no immunization history for the selected administration types on file for this patient.  History  Substance Use Topics  . Smoking status: Current Every Day Smoker -- 0.05 packs/day for 20 years    Types: Cigarettes  . Smokeless tobacco: Never Used  . Alcohol Use: No     Comment: Abuse 4 years ago;unclear if ongoing    Family history is + heart dx.   Review of Systems  DATA OBTAINED: from patient, nurse GENERAL:  no fevers, fatigue, appetite changes SKIN: No itching, rash  EYES: No eye pain, redness, discharge EARS: No earache, tinnitus, change in hearing NOSE: No congestion, drainage or bleeding  MOUTH/THROAT: No mouth or tooth pain, No sore throat RESPIRATORY: No cough, wheezing, SOB CARDIAC:  No chest pain, palpitations, lower extremity edema  GI: No abdominal pain, No N/V/D or constipation, No heartburn or reflux  GU: No dysuria, frequency or urgency, or incontinence  MUSCULOSKELETAL: No unrelieved bone/joint pain NEUROLOGIC: No headache, dizziness or focal weakness PSYCHIATRIC: No c/o anxiety or sadness,    Filed Vitals:   03/06/15 1538  BP: 135/75  Pulse: 80  Temp: 97.5 F (36.4 C)  Resp: 21    Physical Exam  GENERAL APPEARANCE: Alert, conversant,  No acute distress.  SKIN: No diaphoresis rash HEAD: Normocephalic, atraumatic  EYES: Conjunctiva/lids clear. Pupils round, reactive. EOMs intact.  EARS: External exam WNL, canals clear. Hearing grossly normal.  NOSE: No deformity or discharge.  MOUTH/THROAT: Lips w/o lesions  RESPIRATORY: Breathing is even, unlabored. Lung sounds are clear   CARDIOVASCULAR: Heart RRR no murmurs, rubs or gallops. No peripheral edema.   GASTROINTESTINAL: Abdomen is soft, non-tender, not distended w/ normal bowel sounds. GENITOURINARY: Bladder non tender, not distended  MUSCULOSKELETAL: No abnormal joints or musculature NEUROLOGIC:  Cranial nerves 2-12 grossly intact. Moves all extremities ,pt appears to have problem concentrating and following her own thoughts. PSYCHIATRIC: odd affect, no behavioral issues  Patient Active Problem List   Diagnosis Date Noted  . Alcohol abuse, in remission 03/06/2015  . Post concussive syndrome 03/06/2015  . Depression   . Hyperlipidemia   . Hypertension   . Arthritis   . Skull fracture, non depressed 03/02/2015  . Frequent falls 03/02/2015  . Vascular dementia 08/03/2011  . Major depressive disorder, recurrent, severe with psychotic features 07/29/2011   Dg Thoracic Spine 2 View  03/02/2015   CLINICAL DATA:  Fall yesterday with thoracic pain. Initial encounter.  EXAM: THORACIC SPINE - 2-3 VIEWS  COMPARISON:  04/04/2010 and prior chest radiograph  FINDINGS: There is no evidence of acute fracture or  subluxation.  Mild degenerative disc disease at T7-T8 again noted.  No focal bony lesions are identified.  Anterior fusion changes in the lower cervical spine are again identified.  IMPRESSION: No acute abnormalities.   Electronically Signed   By: Harmon Pier M.D.   On: 03/02/2015 14:05   Dg Lumbar Spine Complete  03/02/2015   CLINICAL DATA:  Patient status post fall 2 days prior. Back pain. Initial encounter.  EXAM: LUMBAR SPINE - COMPLETE 4+ VIEW  COMPARISON:  None.  FINDINGS: Normal anatomic alignment. Mild anterior height loss of the L1 vertebral body. L1-2 degenerative disc disease with anterior endplate osteophytosis. Lower lumbar spine facet degenerative changes. The SI joints are unremarkable. Pelvic phleboliths. T11-T12 degenerative disc disease.  IMPRESSION: Mild  anterior height loss of the L1 vertebral body, which appears to have been present on MR 01/22/2007. Recommend correlation for point tenderness.  L1-2 degenerative disc disease.   Electronically Signed   By: Annia Beltrew  Davis M.D.   On: 03/02/2015 14:10   Ct Head Wo Contrast  03/02/2015   CLINICAL DATA:  Patient status post fall 2 days prior. Patient hit the back of her head. No loss of consciousness.  EXAM: CT HEAD WITHOUT CONTRAST  CT CERVICAL SPINE WITHOUT CONTRAST  TECHNIQUE: Multidetector CT imaging of the head and cervical spine was performed following the standard protocol without intravenous contrast. Multiplanar CT image reconstructions of the cervical spine were also generated.  COMPARISON:  Brain CT 07/31/2011  FINDINGS: CT HEAD FINDINGS  Ventricles and sulci are appropriate for patient's age. Overlying the right cerebral convexity there is isodense fluid measuring up to 5 mm. There is approximately 2 mm of right to left midline shift. No evidence for acute cortically based infarct. Periventricular and subcortical white matter hypodensity compatible with chronic small vessel ischemic changes. Orbits are unremarkable. Mucosal thickening  left maxillary sinus. Remainder the paranasal sinuses are unremarkable. Mastoid air cells are well aerated. There is a nondisplaced fracture through the posterior left calvarium extending to the skullbase, possibly involving the left occipital condyle.  CT CERVICAL SPINE FINDINGS  Patient status post anterior plate and screw fixation with fusion of C5, C6 and C7 vertebral bodies. There is degenerative disc disease with mild retrolisthesis of C4 on C5. Bulky anterior osteophytes at C4 level. Craniocervical junction is intact. Focal kyphosis C4-5 level. Lung apices are clear. Visualized thyroid is unremarkable.  IMPRESSION: Nondisplaced fracture through posterior left calvarium extending to the skullbase, possibly extending to left occipital condyle.  Isodense fluid measuring up to 5 mm overlying the right cerebral hemisphere with mild right to left midline shift. This may represent a chronic subdural hematoma or potentially an acute hygroma. This collection was not present on prior CT 12/17/2007. Recommend correlation for interval history.  Anterior cervical spinal fusion hardware from the C5-C7 levels with associated C4-5 degenerative disc disease and focal kyphosis at this level. If there is concern for ligamentous injury at this location consider MRI.  No definite evidence for acute cervical spine fracture.  Chronic small vessel ischemic change.  Critical Value/emergent results were called by telephone at the time of interpretation on 03/02/2015 at 1:25 pm to Dr. Glynn OctaveSTEPHEN RANCOUR , who verbally acknowledged these results.   Electronically Signed   By: Annia Beltrew  Davis M.D.   On: 03/02/2015 13:38   Ct Cervical Spine Wo Contrast  03/02/2015   CLINICAL DATA:  Patient status post fall 2 days prior. Patient hit the back of her head. No loss of consciousness.  EXAM: CT HEAD WITHOUT CONTRAST  CT CERVICAL SPINE WITHOUT CONTRAST  TECHNIQUE: Multidetector CT imaging of the head and cervical spine was performed following the  standard protocol without intravenous contrast. Multiplanar CT image reconstructions of the cervical spine were also generated.  COMPARISON:  Brain CT 07/31/2011  FINDINGS: CT HEAD FINDINGS  Ventricles and sulci are appropriate for patient's age. Overlying the right cerebral convexity there is isodense fluid measuring up to 5 mm. There is approximately 2 mm of right to left midline shift. No evidence for acute cortically based infarct. Periventricular and subcortical white matter hypodensity compatible with chronic small vessel ischemic changes. Orbits are unremarkable. Mucosal thickening left maxillary sinus. Remainder the paranasal sinuses are unremarkable. Mastoid air cells are well aerated. There is  a nondisplaced fracture through the posterior left calvarium extending to the skullbase, possibly involving the left occipital condyle.  CT CERVICAL SPINE FINDINGS  Patient status post anterior plate and screw fixation with fusion of C5, C6 and C7 vertebral bodies. There is degenerative disc disease with mild retrolisthesis of C4 on C5. Bulky anterior osteophytes at C4 level. Craniocervical junction is intact. Focal kyphosis C4-5 level. Lung apices are clear. Visualized thyroid is unremarkable.  IMPRESSION: Nondisplaced fracture through posterior left calvarium extending to the skullbase, possibly extending to left occipital condyle.  Isodense fluid measuring up to 5 mm overlying the right cerebral hemisphere with mild right to left midline shift. This may represent a chronic subdural hematoma or potentially an acute hygroma. This collection was not present on prior CT 12/17/2007. Recommend correlation for interval history.  Anterior cervical spinal fusion hardware from the C5-C7 levels with associated C4-5 degenerative disc disease and focal kyphosis at this level. If there is concern for ligamentous injury at this location consider MRI.  No definite evidence for acute cervical spine fracture.  Chronic small vessel  ischemic change.  Critical Value/emergent results were called by telephone at the time of interpretation on 03/02/2015 at 1:25 pm to Dr. Glynn Octave , who verbally acknowledged these results.   Electronically Signed   By: Annia Belt M.D.   On: 03/02/2015 13:38    CBC    Component Value Date/Time   WBC 10.3 03/05/2015 0522   RBC 2.90* 03/05/2015 0522   RBC 3.05* 08/13/2011 2000   HGB 9.8* 03/05/2015 0522   HCT 29.0* 03/05/2015 0522   PLT 130* 03/05/2015 0522   MCV 100.0 03/05/2015 0522   LYMPHSABS 4.4* 08/13/2011 2000   MONOABS 1.4* 08/13/2011 2000   EOSABS 0.6 08/13/2011 2000   BASOSABS 0.0 08/13/2011 2000    CMP     Component Value Date/Time   NA 135 03/05/2015 0522   K 3.8 03/05/2015 0522   CL 106 03/05/2015 0522   CO2 22 03/05/2015 0522   GLUCOSE 113* 03/05/2015 0522   BUN 8 03/05/2015 0522   CREATININE 0.91 03/05/2015 0522   CALCIUM 7.9* 03/05/2015 0522   PROT 5.6* 03/04/2015 0628   ALBUMIN 2.2* 03/04/2015 0628   AST 183* 03/04/2015 0628   ALT 47 03/04/2015 0628   ALKPHOS 123 03/04/2015 0628   BILITOT 0.6 03/04/2015 0628   GFRNONAA >60 03/05/2015 0522   GFRAA >60 03/05/2015 0522    Assessment and Plan  Skull fracture, non depressed 2/2 to a fall from standing. Unknown if LOC but most certainly with post concussive syndrome since she hit head hard enough to cause a fracture. PCS- difficulty concentrating and thinking were noted on exam. I an d/c norco. Pt already has AMS from concussion, already unstable on feet, already an addict, doesn't need narcotics.  Major depressive disorder, recurrent, severe with psychotic features Mutliple psych admissions reviewed in [facility], for depression, suicidal ideation and with auditory and visual hallucinations, shows how long term and ongoing problem is. Pt is currently on elavil and sertraline which could potentionally aggravate hypotension but which pt absolutely must continue.  Vascular dementia Pt was on aricept prior to  hospitalization , it was d/c in hospital but I'm restarting it because I feel like pt at high risk for decompensation psychologically.  Alcohol abuse, in remission Can't know if drinking led to psychosis or psychosis led to drinking. In any case pt's gait problems very likely from ETOH. Will continue thiamine and folic acid.  Hyperlipidemia  Continue pravachol. Repeat CMP 1 week, AST was elevated in hospital, need to ensure it normalizes with pt on statin.  Arthritis Pt can take Ibuprophen or tylenol for pain. I am d/c norco. She already has AMS from concussion, already unsteady, already an addict, doesn't need narcotics.  Post concussive syndrome With AMS by definition. Will monitor.It will be difficult to tell what is concussion, what is from prior ETOH and what is psych.    Margit Hanks, MD

## 2015-03-06 NOTE — Assessment & Plan Note (Signed)
Continue pravachol. Repeat CMP 1 week, AST was elevated in hospital, need to ensure it normalizes with pt on statin.

## 2015-03-06 NOTE — Assessment & Plan Note (Signed)
Mutliple psych admissions reviewed in [facility], for depression, suicidal ideation and with auditory and visual hallucinations, shows how long term and ongoing problem is. Pt is currently on elavil and sertraline which could potentionally aggravate hypotension but which pt absolutely must continue.

## 2015-03-06 NOTE — Assessment & Plan Note (Signed)
With AMS by definition. Will monitor.It will be difficult to tell what is concussion, what is from prior ETOH and what is psych.

## 2015-03-06 NOTE — Assessment & Plan Note (Signed)
Can't know if drinking led to psychosis or psychosis led to drinking. In any case pt's gait problems very likely from ETOH. Will continue thiamine and folic acid.

## 2015-03-06 NOTE — Assessment & Plan Note (Signed)
Pt can take Ibuprophen or tylenol for pain. I am d/c norco. She already has AMS from concussion, already unsteady, already an addict, doesn't need narcotics.

## 2015-03-26 ENCOUNTER — Non-Acute Institutional Stay (SKILLED_NURSING_FACILITY): Payer: Medicare Other | Admitting: Internal Medicine

## 2015-03-26 ENCOUNTER — Encounter: Payer: Self-pay | Admitting: Internal Medicine

## 2015-03-26 DIAGNOSIS — S065XAA Traumatic subdural hemorrhage with loss of consciousness status unknown, initial encounter: Secondary | ICD-10-CM

## 2015-03-26 DIAGNOSIS — I6200 Nontraumatic subdural hemorrhage, unspecified: Secondary | ICD-10-CM | POA: Diagnosis not present

## 2015-03-26 DIAGNOSIS — S065X9A Traumatic subdural hemorrhage with loss of consciousness of unspecified duration, initial encounter: Secondary | ICD-10-CM

## 2015-03-26 DIAGNOSIS — R0989 Other specified symptoms and signs involving the circulatory and respiratory systems: Secondary | ICD-10-CM | POA: Diagnosis not present

## 2015-03-26 DIAGNOSIS — R531 Weakness: Secondary | ICD-10-CM

## 2015-03-26 DIAGNOSIS — R1314 Dysphagia, pharyngoesophageal phase: Secondary | ICD-10-CM | POA: Insufficient documentation

## 2015-03-26 NOTE — Progress Notes (Signed)
Patient ID: Vanessa Hoffman, female   DOB: 04/12/1956, 59 y.o.   MRN: [id number] MRN: [id number] Name: Vanessa Hoffman  Sex: female Age: 59 y.o. DOB: [birth year]  [facility] #: [facility] Facility/Room:102 Level Of Care: SNF Provider: Roena Malady Emergency Contacts: Extended Emergency Contact Information Primary Emergency Contact: Rennis Harding [place] of [place] Home Phone: [phone] Relation: Father Secondary Emergency Contact: Scribmer,Marie Address: [address]          [place] [address] [place] Home Phone: [phone] Relation: Mother  Code Status:   Allergies: Review of patient's allergies indicates no known allergies.  Chief Complaint  Patient presents with  . Acute Visit   acute visit secondary to decreased swallowing function-gradual decline increased weakness   HPI: Patient is 59 y.o. female who was admitted to SNF for OT/Pt for frequent falls and gait abnormality after being  hospitalized 7/9-12 for  a fall from standing, with reported frequent falls, 4 X a week. 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 At SNF pt will be followed for depression with psychotic features, txed with elavil and zoloft, hyperlipidemia, txed with pravachol and dementia, txed ,with aricept - She was discovered to have a skull fracture in the hospital scan showed chronic subdural hematoma versus an acute hygroma-- The last week or so she's had a gradual decline increased weakness and decreased swallowing function not being able to hold her head up per staff this is a significant change from her baseline I'm following up on this.  Her vital signs remain stable  Past Medical History  Diagnosis Date  . Anxiety   . Fibromyalgia   . Osteoporosis   . Depression   . Hyperlipidemia   . Hypertension   . Substance abuse     ETOH,  reportedly in past, l  . Arthritis   . DDD (degenerative disc disease)   . Diabetes mellitus without complication     Past Surgical History  Procedure Laterality Date  . Cervical disc surgery  2001    3 fusions  . Cesarean section      2      Medication List       This list is accurate as of: 03/26/15 11:59 PM.  Always use your most recent med list.               amitriptyline 50 MG tablet  Commonly known as:  ELAVIL  Take 50 mg by mouth 2 (two) times daily.     donepezil 10 MG tablet  Commonly known as:  ARICEPT  Take 10 mg by mouth at bedtime.     folic acid 1 MG tablet  Commonly known as:  FOLVITE  Take 1 tablet (1 mg total) by mouth daily.     HYDROcodone-acetaminophen 5-325 MG per tablet  Commonly known as:  NORCO/VICODIN  Take 1 tablet by mouth every 12 (twelve) hours as needed for severe pain.     ibuprofen 600 MG tablet  Commonly known as:  ADVIL,MOTRIN  Take 1 tablet (600 mg total) by mouth every 6 (six) hours as needed for headache or moderate pain.     meclizine 25 MG tablet  Commonly known as:  ANTIVERT  Take 1 tablet (25 mg total) by mouth 2 (two) times daily as needed for dizziness.     multivitamins with iron Tabs tablet  Take 1 tablet by mouth daily. Vitamin supplement.     pravastatin 20 MG tablet  Commonly known as:  PRAVACHOL  Take 1 tablet (20 mg total) by mouth at bedtime. For cholesterol management.     promethazine 25 MG tablet  Commonly known as:  PHENERGAN  Take 1 tablet (25 mg total) by mouth every 6 (six) hours as needed for nausea or vomiting.     sertraline 100 MG tablet  Commonly known as:  ZOLOFT  Take 200 mg by mouth daily.     thiamine 100 MG tablet  Take 1 tablet (100 mg total) by mouth daily.        Meds ordered this encounter  Medications  . donepezil (ARICEPT) 10 MG tablet    Sig: Take 10 mg by mouth at bedtime.    There is no immunization history for the selected administration types on file for this  patient.  Social History  Substance Use Topics  . Smoking status: Current Every Day Smoker -- 0.05 packs/day for 20 years    Types: Cigarettes  . Smokeless tobacco: Never Used  . Alcohol Use: No     Comment: Abuse 4 years ago;unclear if ongoing    Family history is + heart dx.   Review of Systems--  DATA OBTAINED: from patient, nurse GENERAL:  no fevers, fatigue, question decreased appetite--increased weakness SKIN: No itching, rash  EYES: No eye pain, redness, discharge EARS: No earache, tinnitus, change in hearing NOSE: No congestion, drainage or bleeding  MOUTH/THROAT: No mouth or tooth pain, No sore throat RESPIRATORY: No cough, wheezing, SOB CARDIAC: No chest pain, palpitations, lower extremity edema  GI: No abdominal pain, No N/V/D or constipation, No heartburn or reflux  GU: No dysuria, frequency or urgency, or incontinence  MUSCULOSKELETAL: No unrelieved bone/joint pain--has increased weakness NEUROLOGIC: Complaining of headaches or dizziness does have some slightly slurred speech which is not new PSYCHIATRIC: No c/o anxiety or sadness,    Filed Vitals:   03/26/15 1440  BP: 126/70  Pulse: 98  Temp: 98.1 F (36.7 C)  Resp: 20    Physical Exam  GENERAL APPEARANCE: Alert, conversant,  No acute distress appears somewhat weak.  SKIN: No diaphoresis rash HEAD: Normocephalic, atraumatic  EYES: Conjunctiva/lids clear. Pupils round, reactive. EOMs intact.  EARS: External exam WNL, canals clear. Hearing grossly normal.  NOSE: No deformity or discharge.  MOUTH/THROAT: Oropharynx is clear mucous membranes moist  RESPIRATORY: Breathing is even, unlabored.  some coarse breath sounds on the left side on expiration   CARDIOVASCULAR: Heart RRR no murmurs, rubs or gallops. No peripheral edema heart sounds are distant.   GASTROINTESTINAL: Abdomen is soft, non-tender, not distended w/ normal bowel sounds. GENITOURINARY: Bladder non tender, not distended  MUSCULOSKELETAL: No  abnormal joints or musculature-- does move all extremities 4 however has generalized weakness which is relatively new according to physical therapy and nursing NEUROLOGIC:  Cranial nerves 2-12 grossly intact she does have some slurred speech which is not new--. Moves all extremities ,pt appears to have problem --concentrating and following her own thoughts--per nursing this has gradually progressed.--She appears a bit sluggish but doest answer questions--strength in her extremities appears to be preserved PSYCHIATRIC: odd affect, no behavioral issues  Patient Active Problem List   Diagnosis Date Noted  . Encephalopathy acute   . Liver cirrhosis   . UTI (lower urinary tract infection)   . Hypokalemia   . Hypomagnesemia   . HLD (hyperlipidemia)   . Coma   .  Postconcussive syndrome   . Encephalopathy, hepatic   . Alcohol abuse   . Elevated liver enzymes   . Increased anion gap metabolic acidosis   . Acute kidney injury   . Altered mental status 04/15/2015  . Change in mental status 04/15/2015  . Anemia 04/15/2015  . AKI (acute kidney injury) 04/15/2015  . Abnormal liver enzymes 04/08/2015  . SDH (subdural hematoma) 04/04/2015  . Generalized weakness 03/30/2015  . Dysphagia, pharyngoesophageal phase 03/26/2015  . Alcohol abuse, in remission 03/06/2015  . Post concussive syndrome 03/06/2015  . Depression   . Hyperlipidemia   . Hypertension   . Arthritis   . Skull fracture, non depressed 03/02/2015  . Frequent falls 03/02/2015  . Vascular dementia 08/03/2011  . Major depressive disorder, recurrent, severe with psychotic features 07/29/2011   Dg Thoracic Spine 2 View  03/02/2015   CLINICAL DATA:  Fall yesterday with thoracic pain. Initial encounter.  EXAM: THORACIC SPINE - 2-3 VIEWS  COMPARISON:  04/04/2010 and prior chest radiograph  FINDINGS: There is no evidence of acute fracture or subluxation.  Mild degenerative disc disease at T7-T8 again noted.  No focal bony lesions are  identified.  Anterior fusion changes in the lower cervical spine are again identified.  IMPRESSION: No acute abnormalities.   Electronically Signed   By: Harmon Pier M.D.   On: 03/02/2015 14:05   Dg Lumbar Spine Complete  03/02/2015   CLINICAL DATA:  Patient status post fall 2 days prior. Back pain. Initial encounter.  EXAM: LUMBAR SPINE - COMPLETE 4+ VIEW  COMPARISON:  None.  FINDINGS: Normal anatomic alignment. Mild anterior height loss of the L1 vertebral body. L1-2 degenerative disc disease with anterior endplate osteophytosis. Lower lumbar spine facet degenerative changes. The SI joints are unremarkable. Pelvic phleboliths. T11-T12 degenerative disc disease.  IMPRESSION: Mild anterior height loss of the L1 vertebral body, which appears to have been present on MR 01/22/2007. Recommend correlation for point tenderness.  L1-2 degenerative disc disease.   Electronically Signed   By: Annia Belt M.D.   On: 03/02/2015 14:10   Ct Head Wo Contrast  03/02/2015   CLINICAL DATA:  Patient status post fall 2 days prior. Patient hit the back of her head. No loss of consciousness.  EXAM: CT HEAD WITHOUT CONTRAST  CT CERVICAL SPINE WITHOUT CONTRAST  TECHNIQUE: Multidetector CT imaging of the head and cervical spine was performed following the standard protocol without intravenous contrast. Multiplanar CT image reconstructions of the cervical spine were also generated.  COMPARISON:  Brain CT 07/31/2011  FINDINGS: CT HEAD FINDINGS  Ventricles and sulci are appropriate for patient's age. Overlying the right cerebral convexity there is isodense fluid measuring up to 5 mm. There is approximately 2 mm of right to left midline shift. No evidence for acute cortically based infarct. Periventricular and subcortical white matter hypodensity compatible with chronic small vessel ischemic changes. Orbits are unremarkable. Mucosal thickening left maxillary sinus. Remainder the paranasal sinuses are unremarkable. Mastoid air cells are  well aerated. There is a nondisplaced fracture through the posterior left calvarium extending to the skullbase, possibly involving the left occipital condyle.  CT CERVICAL SPINE FINDINGS  Patient status post anterior plate and screw fixation with fusion of C5, C6 and C7 vertebral bodies. There is degenerative disc disease with mild retrolisthesis of C4 on C5. Bulky anterior osteophytes at C4 level. Craniocervical junction is intact. Focal kyphosis C4-5 level. Lung apices are clear. Visualized thyroid is unremarkable.  IMPRESSION: Nondisplaced fracture through posterior left calvarium  extending to the skullbase, possibly extending to left occipital condyle.  Isodense fluid measuring up to 5 mm overlying the right cerebral hemisphere with mild right to left midline shift. This may represent a chronic subdural hematoma or potentially an acute hygroma. This collection was not present on prior CT 12/17/2007. Recommend correlation for interval history.  Anterior cervical spinal fusion hardware from the C5-C7 levels with associated C4-5 degenerative disc disease and focal kyphosis at this level. If there is concern for ligamentous injury at this location consider MRI.  No definite evidence for acute cervical spine fracture.  Chronic small vessel ischemic change.  Critical Value/emergent results were called by telephone at the time of interpretation on 03/02/2015 at 1:25 pm to Dr. Glynn Octave , who verbally acknowledged these results.   Electronically Signed   By: Annia Belt M.D.   On: 03/02/2015 13:38   Ct Cervical Spine Wo Contrast  03/02/2015   CLINICAL DATA:  Patient status post fall 2 days prior. Patient hit the back of her head. No loss of consciousness.  EXAM: CT HEAD WITHOUT CONTRAST  CT CERVICAL SPINE WITHOUT CONTRAST  TECHNIQUE: Multidetector CT imaging of the head and cervical spine was performed following the standard protocol without intravenous contrast. Multiplanar CT image reconstructions of the  cervical spine were also generated.  COMPARISON:  Brain CT 07/31/2011  FINDINGS: CT HEAD FINDINGS  Ventricles and sulci are appropriate for patient's age. Overlying the right cerebral convexity there is isodense fluid measuring up to 5 mm. There is approximately 2 mm of right to left midline shift. No evidence for acute cortically based infarct. Periventricular and subcortical white matter hypodensity compatible with chronic small vessel ischemic changes. Orbits are unremarkable. Mucosal thickening left maxillary sinus. Remainder the paranasal sinuses are unremarkable. Mastoid air cells are well aerated. There is a nondisplaced fracture through the posterior left calvarium extending to the skullbase, possibly involving the left occipital condyle.  CT CERVICAL SPINE FINDINGS  Patient status post anterior plate and screw fixation with fusion of C5, C6 and C7 vertebral bodies. There is degenerative disc disease with mild retrolisthesis of C4 on C5. Bulky anterior osteophytes at C4 level. Craniocervical junction is intact. Focal kyphosis C4-5 level. Lung apices are clear. Visualized thyroid is unremarkable.  IMPRESSION: Nondisplaced fracture through posterior left calvarium extending to the skullbase, possibly extending to left occipital condyle.  Isodense fluid measuring up to 5 mm overlying the right cerebral hemisphere with mild right to left midline shift. This may represent a chronic subdural hematoma or potentially an acute hygroma. This collection was not present on prior CT 12/17/2007. Recommend correlation for interval history.  Anterior cervical spinal fusion hardware from the C5-C7 levels with associated C4-5 degenerative disc disease and focal kyphosis at this level. If there is concern for ligamentous injury at this location consider MRI.  No definite evidence for acute cervical spine fracture.  Chronic small vessel ischemic change.  Critical Value/emergent results were called by telephone at the time of  interpretation on 03/02/2015 at 1:25 pm to Dr. Glynn Octave , who verbally acknowledged these results.   Electronically Signed   By: Annia Belt M.D.   On: 03/02/2015 13:38    03/05/2015.  Sodium 135 potassium 3.8 BUN 8 creatinine 0.9 CO2 22.  WBC 10.3 hemoglobin 9.8 platelets 1:30.  Assessment plan.  #1-history of gradual decline decreased swallow function weakness-with patient's history of skull fracture and possible subdural hematoma will obtain a CT scan of the head with contrast as soon  as this can be arranged-clinically she does not appear to be unstable apparently this is been a gradual decline over the past week or so.  Her vital signs are stable neurologic she appears to be grossly intact however has increased weakness in therapy with concerns as swallowing as well-she has been evaluated by speech therapy and apparently patient has some dislike of all foods thought to be possibly neurologic in origin recommendation to immediately clear throat after sips-apparently she is requiring assistance bring cup to mouth at times-this will have to be followed closely Will update a CMP CBC tomorrow morning-.  I also note some chest congestion will order a chest x-ray cannot rule out possibly respiratory etiology  to this change as well.  [id number] note greater than 50 minutes spent assessing patient-discussing her staff with multiple staff members including nursing and physical therapy-extensive review of her chart-and  coordinating and formulating a plan of care-of note greater than 50% of time spent coordinating plan of care -      Assessment and Plan    LASSEN, ARLO C,

## 2015-03-27 ENCOUNTER — Other Ambulatory Visit: Payer: Self-pay | Admitting: Internal Medicine

## 2015-03-27 DIAGNOSIS — R4182 Altered mental status, unspecified: Secondary | ICD-10-CM

## 2015-03-28 ENCOUNTER — Emergency Department (HOSPITAL_COMMUNITY)
Admission: RE | Admit: 2015-03-28 | Discharge: 2015-03-28 | Disposition: A | Discharge status: Home / Self Care | Payer: Medicare Other | Source: Ambulatory Visit | Attending: Internal Medicine | Admitting: Internal Medicine

## 2015-03-28 ENCOUNTER — Non-Acute Institutional Stay (SKILLED_NURSING_FACILITY): Payer: Medicare Other | Admitting: Adult Health

## 2015-03-28 ENCOUNTER — Encounter (HOSPITAL_COMMUNITY): Payer: Self-pay | Admitting: *Deleted

## 2015-03-28 ENCOUNTER — Other Ambulatory Visit: Payer: Self-pay

## 2015-03-28 ENCOUNTER — Emergency Department (HOSPITAL_COMMUNITY)
Admission: EM | Admit: 2015-03-28 | Discharge: 2015-03-28 | Disposition: A | Discharge status: Home / Self Care | Payer: Medicare Other | Attending: Emergency Medicine | Admitting: Emergency Medicine

## 2015-03-28 ENCOUNTER — Other Ambulatory Visit: Payer: Self-pay | Admitting: Internal Medicine

## 2015-03-28 ENCOUNTER — Encounter (HOSPITAL_COMMUNITY): Payer: Self-pay

## 2015-03-28 DIAGNOSIS — R296 Repeated falls: Secondary | ICD-10-CM | POA: Diagnosis not present

## 2015-03-28 DIAGNOSIS — I6201 Nontraumatic acute subdural hemorrhage: Secondary | ICD-10-CM

## 2015-03-28 DIAGNOSIS — S0291XD Unspecified fracture of skull, subsequent encounter for fracture with routine healing: Secondary | ICD-10-CM

## 2015-03-28 DIAGNOSIS — F329 Major depressive disorder, single episode, unspecified: Secondary | ICD-10-CM | POA: Insufficient documentation

## 2015-03-28 DIAGNOSIS — I10 Essential (primary) hypertension: Secondary | ICD-10-CM | POA: Diagnosis not present

## 2015-03-28 DIAGNOSIS — I6203 Nontraumatic chronic subdural hemorrhage: Secondary | ICD-10-CM | POA: Insufficient documentation

## 2015-03-28 DIAGNOSIS — R4182 Altered mental status, unspecified: Secondary | ICD-10-CM

## 2015-03-28 DIAGNOSIS — M1990 Unspecified osteoarthritis, unspecified site: Secondary | ICD-10-CM | POA: Diagnosis not present

## 2015-03-28 DIAGNOSIS — Z79899 Other long term (current) drug therapy: Secondary | ICD-10-CM | POA: Insufficient documentation

## 2015-03-28 DIAGNOSIS — E119 Type 2 diabetes mellitus without complications: Secondary | ICD-10-CM | POA: Insufficient documentation

## 2015-03-28 DIAGNOSIS — Z720 Tobacco use: Secondary | ICD-10-CM | POA: Diagnosis not present

## 2015-03-28 DIAGNOSIS — E785 Hyperlipidemia, unspecified: Secondary | ICD-10-CM | POA: Insufficient documentation

## 2015-03-28 DIAGNOSIS — F419 Anxiety disorder, unspecified: Secondary | ICD-10-CM | POA: Diagnosis not present

## 2015-03-28 DIAGNOSIS — R531 Weakness: Secondary | ICD-10-CM | POA: Diagnosis not present

## 2015-03-28 DIAGNOSIS — M797 Fibromyalgia: Secondary | ICD-10-CM | POA: Diagnosis not present

## 2015-03-28 DIAGNOSIS — D689 Coagulation defect, unspecified: Secondary | ICD-10-CM | POA: Diagnosis present

## 2015-03-28 HISTORY — DX: Type 2 diabetes mellitus without complications: E11.9

## 2015-03-28 MED ORDER — IOHEXOL 300 MG/ML  SOLN
100.0000 mL | Freq: Once | INTRAMUSCULAR | Status: AC | PRN
  Administered 2015-03-28: 100 mL via INTRAVENOUS

## 2015-03-28 NOTE — ED Provider Notes (Signed)
CSN: [id number]     Arrival date & time 03/28/15  1636 History   First MD Initiated Contact with Patient 03/28/15 1648     Chief Complaint  Patient presents with  . Coagulation Disorder     (Consider location/radiation/quality/duration/timing/severity/associated sxs/prior Treatment) HPI   [id number] sent to ED after having CT today which showed an interval enlargement of R sided SDH measuring 13mm with mixed density suggesting subacute hemorrhage on chronic. Pt initially had a fall on 7/7 and seen in ED/admitted on 7/9.  CT head then significant for calvarium fracture as well as 5 mm isodense lesion concerning for chronic subdural versus hygroma. She was discharged from hospital to rehab facility. It is not completely clear to me as to why she had repeat imaging again today. Has a hx of frequent falls, but none reported recently. Father at bedside. He last saw her two days ago and noted some confusion then (was trying to use television remote to message her husband, was having difficulty remembering close family members names). She reports no complaints to me. Denies any pain anywhere. Denies recent falls. No acute visual complaints. No fever or chills.   Past Medical History  Diagnosis Date  . Anxiety   . Fibromyalgia   . Osteoporosis   . Depression   . Hyperlipidemia   . Hypertension   . Substance abuse     ETOH, reportedly in past, l  . Arthritis   . DDD (degenerative disc disease)   . Diabetes mellitus without complication    Past Surgical History  Procedure Laterality Date  . Cervical disc surgery  2001    3 fusions  . Cesarean section      2   Family History  Problem Relation Age of Onset  . Heart disease Father    History  Substance Use Topics  . Smoking status: Current Every Day Smoker -- 0.05 packs/day for 20 years    Types: Cigarettes  . Smokeless tobacco: Never Used  . Alcohol Use: No     Comment: Abuse 4 years ago;unclear if ongoing   OB History    No data  available     Review of Systems  All systems reviewed and negative, other than as noted in HPI.   Allergies  Review of patient's allergies indicates no known allergies.  Home Medications   Prior to Admission medications   Medication Sig Start Date End Date Taking? Authorizing Provider  amitriptyline (ELAVIL) 50 MG tablet Take 50 mg by mouth 2 (two) times daily.    Historical Provider, MD  donepezil (ARICEPT) 10 MG tablet Take 10 mg by mouth at bedtime.    Historical Provider, MD  folic acid (FOLVITE) 1 MG tablet Take 1 tablet (1 mg total) by mouth daily. 03/05/15   Lora Paula, MD  HYDROcodone-acetaminophen (NORCO/VICODIN) 5-325 MG per tablet Take 1 tablet by mouth every 12 (twelve) hours as needed for severe pain. 03/05/15   Lora Paula, MD  ibuprofen (ADVIL,MOTRIN) 600 MG tablet Take 1 tablet (600 mg total) by mouth every 6 (six) hours as needed for headache or moderate pain. 03/05/15   Lora Paula, MD  meclizine (ANTIVERT) 25 MG tablet Take 1 tablet (25 mg total) by mouth 2 (two) times daily as needed for dizziness. 03/05/15   Lora Paula, MD  Multiple Vitamins-Iron (MULTIVITAMINS WITH IRON) TABS Take 1 tablet by mouth daily. Vitamin supplement. Patient not taking: Reported on 03/02/2015 08/21/11   Viviann Spare, FNP  pravastatin (  PRAVACHOL) 20 MG tablet Take 1 tablet (20 mg total) by mouth at bedtime. For cholesterol management. Patient not taking: Reported on 03/02/2015 08/21/11   Viviann Spare, FNP  promethazine (PHENERGAN) 25 MG tablet Take 1 tablet (25 mg total) by mouth every 6 (six) hours as needed for nausea or vomiting. 03/05/15   Lora Paula, MD  sertraline (ZOLOFT) 100 MG tablet Take 200 mg by mouth daily.    Historical Provider, MD  thiamine 100 MG tablet Take 1 tablet (100 mg total) by mouth daily. 03/05/15   Lora Paula, MD   BP 108/95 mmHg  Pulse 93  Temp(Src) 97.6 F (36.4 C) (Oral)  Resp 24  Ht  (1.6 m)  Wt 193 lb (87.544 kg)  BMI  34.20 kg/m2  SpO2 98% Physical Exam  Constitutional: She appears well-developed and well-nourished. No distress.  No external signs of acute head/facial trauma.   HENT:  Head: Normocephalic and atraumatic.  Eyes: Conjunctivae are normal. Right eye exhibits no discharge. Left eye exhibits no discharge.  Neck: Neck supple.  Cardiovascular: Normal rate, regular rhythm and normal heart sounds.  Exam reveals no gallop and no friction rub.   No murmur heard. Pulmonary/Chest: Effort normal and breath sounds normal. No respiratory distress.  Abdominal: Soft. She exhibits no distension. There is no tenderness.  Musculoskeletal: She exhibits no edema or tenderness.  Neurological: She is alert.  Speech clear. Content appropriate. Follows commands. Thought was July, but able to give correct day or week and year. CN intact. No focal motor deficit.   Skin: Skin is warm and dry.  Psychiatric: She has a normal mood and affect. Her behavior is normal. Thought content normal.  Nursing note and vitals reviewed.   ED Course  Procedures (including critical care time) Labs Review Labs Reviewed - No data to display  Imaging Review Ct Head W Wo Contrast  03/28/2015   CLINICAL DATA:  History of fall. Bilateral numbness and weakness. Decreased mental status.  EXAM: CT HEAD WITHOUT AND WITH CONTRAST  TECHNIQUE: Contiguous axial images were obtained from the base of the skull through the vertex without and with intravenous contrast  CONTRAST:  OMNIPAQUE IOHEXOL 300 MG/ML  SOLN  COMPARISON:  CT head 03/02/2015  FINDINGS: Interval enlargement of subdural hematoma on the right now measuring 13 mm in thickness. This has predominately low-density but also areas of higher density blood. Mild mass-effect on the right hemisphere and mild midline shift to the left 3 mm, slightly increased from the prior study. No subarachnoid hemorrhage.  Ventricle size normal. Mild chronic microvascular ischemic change in the white  matter. No acute infarct or mass.  Left occipital skull fracture is nondisplaced and unchanged from the prior study. No new fracture.  Paranasal sinuses clear  IMPRESSION: Enlargement of right-sided subdural hematoma now measuring 13 mm. Subdural hematoma has mixed high and low density suggesting chronic and subacute hemorrhage. Mild midline shift to the left measuring 3 mm has increased slightly from the prior study.  Nondisplaced left occipital fracture unchanged from the prior study.  Critical Value/emergent results were called by telephone at the time of interpretation on 03/28/2015 at 4:19 pm to Wyatt Portela NP , who verbally acknowledged these results.   Electronically Signed   By: Marlan Palau M.D.   On: 03/28/2015 16:20     EKG Interpretation None      MDM   Final diagnoses:  Acute on chronic intracranial subdural hematoma    Discussed case  with Dr Venetia Maxon who reviewed imaging. He does not feel this requires any emergent intervention nor admission for observation if patient does not have any acute complaints and her exam is otherwise reassuring.     Raeford Razor, MD 03/30/15 [phone]

## 2015-03-28 NOTE — Discharge Instructions (Signed)
Subdural Hematoma  A subdural hematoma is a collection of blood between the brain and its tough outermost membrane covering (the dura).  Blood clots that form in this area push down on the brain and cause irritation. A subdural hematoma may cause parts of the brain to stop working and eventually cause death.   CAUSES  A subdural hematoma is caused by bleeding from a ruptured blood vessel (hemorrhage). The bleeding results from trauma to the head, such as from a fall or motor vehicle accident.  There are two types of subdural hemorrhages:  · Acute. This type develops shortly after a serious blow to the head and causes blood to collect very quickly. If not diagnosed and treated promptly, severe brain injury or death can occur.  · Chronic. This is when bleeding develops more slowly, over weeks or months.  RISK FACTORS  People at risk for subdural hematoma include older persons, infants, and alcoholics.  SYMPTOMS  An acute subdural hemorrhage develops over minutes to hours. Symptoms can include:  · Temporary loss of consciousness.  · Weakness of arms or legs on one side of the body.  · Changes in vision or speech.  · A severe headache.  · Seizures.  · Nausea and vomiting.  · Increased sleepiness.  A chronic subdural hemorrhage develops over weeks to months. Symptoms may develop slowly and produce less noticeable problems or changes. Symptoms include:  · A mild headache.  · A change in personality.  · Loss of balance or difficulty walking.  · Weakness, numbness, or tingling in the arms or legs.  · Nausea or vomiting.  · Memory loss.  · Double vision.  · Increased sleepiness.  DIAGNOSIS  Your health care provider will perform a thorough physical and neurological exam. A CT scan or MRI may also be done. If there is blood on the scan, its color will help your health care provider determine how long the hemorrhage has been there.  TREATMENT  If the cause is an acute subdural hemorrhage, immediate treatment is needed. In many  cases an emergency surgery is performed to drain accumulated blood or to remove the blood clot. Sometimes steroid or diuretic medicines or controlled breathing through a ventilator is needed to decrease pressure in the brain. This is especially true if there is any swelling of the brain.  If the cause is a chronic subdural hemorrhage, treatment depends on a variety of factors. Sometimes no treatment is needed. If the subdural hematoma is small and causes minimal or no symptoms, you may be treated with bed rest, medicines, and observation. If the hemorrhage is large or if you have neurological symptoms, an emergency surgery is usually needed to remove the blood clot.  People who develop a subdural hemorrhage are at risk of developing seizures, even after the subdural hematoma has been treated. You may be prescribed an anti-seizure (anticonvulsant) medicine for a year or longer.  HOME CARE INSTRUCTIONS  · Only take medicines as directed by your health care provider.  · Rest if directed by your health care provider.  · Keep all follow-up appointments with your health care provider.  · If you play a contact sport such as football, hockey or soccer and you experienced a significant head injury, allow enough time for healing (up to 15 days) before you start playing again. A repeated injury that occurs during this fragile repair period is likely to result in hemorrhage. This is called the second impact syndrome.  SEEK IMMEDIATE MEDICAL CARE IF:  ·   You fall or experience minor trauma to your head and you are taking blood thinners. If you are on any blood thinners even a very small injury can cause a subdural hematoma. You should not hesitate to seek medical attention regardless of how minor you think your symptoms are.  · You experience a head injury and have:  ¨ Drowsiness or a decrease in alertness.  ¨ Confusion or forgetfulness.  ¨ Slurred speech.  ¨ Irrational or aggressive behavior.  ¨ Numbness or paralysis in any part  of the body.  ¨ A feeling of being sick to your stomach (nauseous) or you throw up (vomit).  ¨ Difficulty walking or poor coordination.  ¨ Double vision.  ¨ Seizures.  ¨ A bleeding disorder.  ¨ A history of heavy alcohol use.  ¨ Clear fluid draining from your nose or ears.  ¨ Personality changes.  ¨ Difficulty thinking.  ¨ Worsening symptoms.  MAKE SURE YOU:  · Understand these instructions.  · Will watch your condition.  · Will get help right away if you are not doing well or get worse.  FOR MORE INFORMATION  National Institute of Neurological Disorders and Stroke: www.ninds.nih.gov  American Association of Neurological Surgeons: www.neurosurgerytoday.org  American Academy of Neurology (AAN): www.aan.com  Brain Injury Association of America: www.biausa.org  Document Released: 06/27/2004 Document Revised: 05/31/2013 Document Reviewed: 02/10/2013  ExitCare® Patient Information ©2015 [employer]. This information is not intended to replace advice given to you by your health care provider. Make sure you discuss any questions you have with your health care provider.

## 2015-03-28 NOTE — ED Notes (Signed)
[facility] contacted to tx patient to [facility];  [facility] will pick up patient's wheelchair tomorrow.  (located in storage room between [employer] [address])

## 2015-03-28 NOTE — ED Notes (Addendum)
Pt fell 1 month ago and was coming to get checked out due to head aches and CT showed 13 mm subdural bleed with 3 mm midline shift.  Pt denies any pain or other medical issues.  Pt has a well approximated scab in mid back of head 1"x 1 1/2", well healed, no warmth or tenderness when palpated.  Pt denies HA at this time.

## 2015-03-30 DIAGNOSIS — R531 Weakness: Secondary | ICD-10-CM | POA: Insufficient documentation

## 2015-03-30 NOTE — Progress Notes (Signed)
Patient ID: Vanessa Hoffman, female   DOB: 1956/04/06, 59 y.o.   MRN: [id number]    Facility: [facility]       No Known Allergies  Chief Complaint  Patient presents with  . Acute Visit    patient status     HPI:  Sh is a resident of this facility who is here for short term rehab after suffering a skull fracture. She had had falls home; with worsening lower extremity weakness. Staff reports that upon her admission to this facility she was able to stand and ambulate several steps. She is now unable to bear her body weight upon her legs. She is starting to ignore her left arm. She has had a decline in her cognition. She is having increased difficulty holding up her head. She is due to have a ct scan of her head today.    Past Medical History  Diagnosis Date  . Anxiety   . Fibromyalgia   . Osteoporosis   . Depression   . Hyperlipidemia   . Hypertension   . Substance abuse     ETOH, reportedly in past, l  . Arthritis   . DDD (degenerative disc disease)   . Diabetes mellitus without complication     Past Surgical History  Procedure Laterality Date  . Cervical disc surgery  2001    3 fusions  . Cesarean section      2    VITAL SIGNS BP 144/83 mmHg  Pulse 79  Ht  (1.6 m)  Wt 189 lb 9.6 oz (86.002 kg)  BMI 33.59 kg/m2  Patient's Medications  New Prescriptions   No medications on file  Previous Medications   AMITRIPTYLINE (ELAVIL) 50 MG TABLET    Take 50 mg by mouth 2 (two) times daily.   DONEPEZIL (ARICEPT) 10 MG TABLET    Take 10 mg by mouth at bedtime.   FOLIC ACID (FOLVITE) 1 MG TABLET    Take 1 tablet (1 mg total) by mouth daily.   HYDROCODONE-ACETAMINOPHEN (NORCO/VICODIN) 5-325 MG PER TABLET    Take 1 tablet by mouth every 12 (twelve) hours as needed for severe pain.   IBUPROFEN (ADVIL,MOTRIN) 600 MG TABLET    Take 1 tablet (600 mg total) by mouth every 6 (six) hours as needed for headache or moderate pain.   MECLIZINE (ANTIVERT) 25 MG TABLET    Take 1  tablet (25 mg total) by mouth 2 (two) times daily as needed for dizziness.   MULTIPLE VITAMINS-IRON (MULTIVITAMINS WITH IRON) TABS    Take 1 tablet by mouth daily. Vitamin supplement.   PRAVASTATIN (PRAVACHOL) 20 MG TABLET    Take 1 tablet (20 mg total) by mouth at bedtime. For cholesterol management.   PROMETHAZINE (PHENERGAN) 25 MG TABLET    Take 1 tablet (25 mg total) by mouth every 6 (six) hours as needed for nausea or vomiting.   SERTRALINE (ZOLOFT) 100 MG TABLET    Take 200 mg by mouth daily.   THIAMINE 100 MG TABLET    Take 1 tablet (100 mg total) by mouth daily.  Modified Medications   No medications on file  Discontinued Medications   No medications on file     SIGNIFICANT DIAGNOSTIC EXAMS  03-02-15: ct of head and cervical spine: Nondisplaced fracture through posterior left calvarium extending tothe skullbase, possibly extending to left occipital condyle. Isodense fluid measuring up to 5 mm overlying the right cerebral hemisphere with mild right to left midline shift. This may represent  a chronic subdural hematoma or potentially an acute hygroma. This collection was not present on prior CT 12/17/2007.  Anterior cervical spinal fusion hardware from the C5-C7 levels with associated C4-5 degenerative disc disease and focal kyphosis at this level.  No definite evidence for acute cervical spine fracture. Chronic small vessel ischemic change.  03-02-15; lumbar spine x-ray: Mild anterior height loss of the L1 vertebral body, which appears to have been present on MR 01/22/2007. Recommend correlation for point tenderness. L1-2 degenerative disc disease.  03-02-15: thoracic spine x-ray: No acute abnormalities.   03-04-15: 2-d echo: Left ventricle: The cavity size was normal. Wall thickness was normal. Systolic function was normal. The estimated ejection fraction was in the range of 55% to 60%. Wall motion was normal; there were no regional wall motion abnormalities. Doppler parameters are  consistent with abnormal left ventricular relaxation (grade 1 diastolic dysfunction). - Mitral valve: Calcified annulus.   LABS REVIEWED:   03-02-15: wbc 11.3; hgb 11.2; hct 33.4; mcv 99.7; plt 144; glucose 135; bun 12; creat 1.28; k+3.1; na++136 03-03-15: wbc 9.5; hgb 10.2; hct 30.2; mcv 101.7; plt 126; glucose 132; bun 9; creat 1.18; k+2.9; na++136; mag 1.0; tsh 4.473; hgb a1c 6.3 03-04-15; wbc 9.6; hgb 10.1; hct 30.6; mcv 100.0; plt 138; glucose 140; bun 7; creat 0.89; k+4.0; na++135; ast 183; albumin 2.2; mag 1.5; vit b12: 1352; folate 24.5      Review of Systems  Unable to perform ROS: Dementia      Physical Exam  Vitals reviewed. Constitutional: No distress.  Eyes: Conjunctivae are normal.  Neck: Neck supple. No JVD present. No thyromegaly present.  Cardiovascular: Normal rate, regular rhythm and intact distal pulses.   Respiratory: Effort normal and breath sounds normal. No respiratory distress. She has no wheezes.  GI: Soft. Bowel sounds are normal. She exhibits no distension. There is no tenderness.  Musculoskeletal: She exhibits no edema.  Has some left side neglect with her left upper extremity; she will use it upon command; but is ignoring her arm  Is unable to bear her weight in her lower extremites  Lymphadenopathy:    She has no cervical adenopathy.  Neurological: She is alert.  Skin: Skin is warm and dry. She is not diaphoretic.  Psychiatric: She has a normal mood and affect.       ASSESSMENT/ PLAN:  Skull fracture Falls Weakness Will setup a neurology consult as soon as possible due to the decline in her functional status. Will await the results of her ct scan. Will continue to monitor her status.    Time spent with patient  45  minutes >50% time spent counseling; reviewing medical record; tests; labs; and developing future plan of care   Synthia Innocent NP [facility]  Contact [phone] Monday through Friday 8am- 5pm  After hours call  [phone]

## 2015-04-04 ENCOUNTER — Encounter: Payer: Self-pay | Admitting: Internal Medicine

## 2015-04-04 ENCOUNTER — Non-Acute Institutional Stay (SKILLED_NURSING_FACILITY): Payer: Medicare Other | Admitting: Internal Medicine

## 2015-04-04 DIAGNOSIS — S0291XD Unspecified fracture of skull, subsequent encounter for fracture with routine healing: Secondary | ICD-10-CM | POA: Diagnosis not present

## 2015-04-04 DIAGNOSIS — I6200 Nontraumatic subdural hemorrhage, unspecified: Secondary | ICD-10-CM | POA: Diagnosis not present

## 2015-04-04 DIAGNOSIS — R531 Weakness: Secondary | ICD-10-CM

## 2015-04-04 DIAGNOSIS — B370 Candidal stomatitis: Secondary | ICD-10-CM

## 2015-04-04 DIAGNOSIS — S065X9A Traumatic subdural hemorrhage with loss of consciousness of unspecified duration, initial encounter: Secondary | ICD-10-CM

## 2015-04-04 DIAGNOSIS — S065XAA Traumatic subdural hemorrhage with loss of consciousness status unknown, initial encounter: Secondary | ICD-10-CM | POA: Insufficient documentation

## 2015-04-04 NOTE — Progress Notes (Signed)
Patient ID: Vanessa Hoffman, female   DOB: November 04, 1955, 59 y.o.   MRN: [id number]  This is an acute visit.  Level of care skilled   Facility: [facility]       No Known Allergies  Chief Complaint  Patient presents with  . Acute Visit   follow-up subdural hematoma with ER visit  HPI:  Vanessa Hoffman is a resident of this facility who is here for short term rehab after suffering a skull fracture. She had had falls home; with worsening lower extremity weakness. Staff reports that upon her admission to this facility she was able to stand and ambulate several steps. She is now unable to bear her body weight upon her legs. She is starting to ignore her left arm. She has had a decline in her cognition. She is having increased difficulty holding up her head. She was sent to the ER after CT scan showed. Enlargement of a right sided subdural hematoma.  Patient was evaluated in the ER and thought to be stable neurologically-neurology was consulted and decision was made that she could go back to the nursing home since she appeared to be stable with no overt deficits.  Her status remains relatively baseline was still continued weakness as noted above-a neurology consult is pending.  Nursing staff is also noted a coating on her tongue they would like addressed.  Patient's vital signs remained stable she continues to be weak but relatively unchanged since initial presentation before she went for the scan.     Past Medical History  Diagnosis Date  . Anxiety   . Fibromyalgia   . Osteoporosis   . Depression   . Hyperlipidemia   . Hypertension   . Substance abuse     ETOH, reportedly in past, l  . Arthritis   . DDD (degenerative disc disease)   . Diabetes mellitus without complication     Past Surgical History  Procedure Laterality Date  . Cervical disc surgery  2001    3 fusions  . Cesarean section      2    VITAL SIGNS BP 130/88 mmHg  Pulse 80  Temp(Src) 97.5 F (36.4 C)  Resp  20  Patient's Medications  New Prescriptions   No medications on file  Previous Medications   AMITRIPTYLINE (ELAVIL) 50 MG TABLET    Take 50 mg by mouth 2 (two) times daily.   DONEPEZIL (ARICEPT) 10 MG TABLET    Take 10 mg by mouth at bedtime.   FOLIC ACID (FOLVITE) 1 MG TABLET    Take 1 tablet (1 mg total) by mouth daily.   HYDROCODONE-ACETAMINOPHEN (NORCO/VICODIN) 5-325 MG PER TABLET    Take 1 tablet by mouth every 12 (twelve) hours as needed for severe pain.   IBUPROFEN (ADVIL,MOTRIN) 600 MG TABLET    Take 1 tablet (600 mg total) by mouth every 6 (six) hours as needed for headache or moderate pain.   MECLIZINE (ANTIVERT) 25 MG TABLET    Take 1 tablet (25 mg total) by mouth 2 (two) times daily as needed for dizziness.   MULTIPLE VITAMINS-IRON (MULTIVITAMINS WITH IRON) TABS    Take 1 tablet by mouth daily. Vitamin supplement.   PRAVASTATIN (PRAVACHOL) 20 MG TABLET    Take 1 tablet (20 mg total) by mouth at bedtime. For cholesterol management.   PROMETHAZINE (PHENERGAN) 25 MG TABLET    Take 1 tablet (25 mg total) by mouth every 6 (six) hours as needed for nausea or vomiting.   SERTRALINE (ZOLOFT)  100 MG TABLET    Take 200 mg by mouth daily.   THIAMINE 100 MG TABLET    Take 1 tablet (100 mg total) by mouth daily.  Modified Medications   No medications on file  Discontinued Medications   No medications on file     SIGNIFICANT DIAGNOSTIC EXAMS  03/28/2015-CT scan of head showed interval enlargement of right-sided subdural hematoma  03-02-15: ct of head and cervical spine: Nondisplaced fracture through posterior left calvarium extending tothe skullbase, possibly extending to left occipital condyle. Isodense fluid measuring up to 5 mm overlying the right cerebral hemisphere with mild right to left midline shift. This may represent a chronic subdural hematoma or potentially an acute hygroma. This collection was not present on prior CT 12/17/2007.  Anterior cervical spinal fusion hardware from  the C5-C7 levels with associated C4-5 degenerative disc disease and focal kyphosis at this level.  No definite evidence for acute cervical spine fracture. Chronic small vessel ischemic change.  03-02-15; lumbar spine x-ray: Mild anterior height loss of the L1 vertebral body, which appears to have been present on MR 01/22/2007. Recommend correlation for point tenderness. L1-2 degenerative disc disease.  03-02-15: thoracic spine x-ray: No acute abnormalities.   03-04-15: 2-d echo: Left ventricle: The cavity size was normal. Wall thickness was normal. Systolic function was normal. The estimated ejection fraction was in the range of 55% to 60%. Wall motion was normal; there were no regional wall motion abnormalities. Doppler parameters are consistent with abnormal left ventricular relaxation (grade 1 diastolic dysfunction). - Mitral valve: Calcified annulus.   LABS REVIEWED:  03/27/2015.  Albumin 2.7.  Alkaline phosphatase 128-AST 78-ALT 142.  Sodium 132 potassium 3.7 BUN 24 creatinine 1.1.  WBC 13.8 hemoglobin 10.2 platelets 208 absolute neutrophils 8.8   03-02-15: wbc 11.3; hgb 11.2; hct 33.4; mcv 99.7; plt 144; glucose 135; bun 12; creat 1.28; k+3.1; na++136 03-03-15: wbc 9.5; hgb 10.2; hct 30.2; mcv 101.7; plt 126; glucose 132; bun 9; creat 1.18; k+2.9; na++136; mag 1.0; tsh 4.473; hgb a1c 6.3 03-04-15; wbc 9.6; hgb 10.1; hct 30.6; mcv 100.0; plt 138; glucose 140; bun 7; creat 0.89; k+4.0; na++135; ast 183; albumin 2.2; mag 1.5; vit b12: 1352; folate 24.5      Review of Systems  Unable to perform ROS: Dementia      Physical Exam  Vitals reviewed. Constitutional: No distress.  Eyes: Conjunctivae are normal. Oropharynx she does have a slight white film on her tongue  Neck: Neck supple. No JVD present. No thyromegaly present.  Cardiovascular: Normal rate, regular rhythm and intact distal pulses.   Respiratory: Effort normal and breath sounds normal. No respiratory distress. She has  no wheezes.  GI: Soft. Bowel sounds are normal. She exhibits no distension. There is no tenderness.  Musculoskeletal: She exhibits no edema.  Has some left side neglect with her left upper extremity; she will use it upon command;   Is unable to bear her weight in her lower extremites  Lymphadenopathy:    She has no cervical adenopathy.  Neurological: She is alert speech is slightly slurred which is not new.  Skin: Skin is warm and dry. She is not diaphoretic.  Psychiatric: She has a normal mood and affect.       ASSESSMENT/ PLAN:  Skull fracture Falls Weakness As noted above she has had ER evaluation with consult with neurology for CT scan showing interval enlargement of right subdural hematoma--she was thought to be stable and is back in the facility-neurology consult is  pending very important that this be followed up on expediently.  Neurologically appears to be unchanged.  Suspect candidiasis on tongue we'll treat with nystatin swish and spit 4 times a day 5 days and monitor.  #3 history of mildly elevated liver function test will update labs.  I also note leukocytosis will check a CBC with differential for updating.  [id number]      T\

## 2015-04-08 ENCOUNTER — Non-Acute Institutional Stay (SKILLED_NURSING_FACILITY): Payer: Medicare Other | Admitting: Internal Medicine

## 2015-04-08 ENCOUNTER — Encounter: Payer: Self-pay | Admitting: Internal Medicine

## 2015-04-08 DIAGNOSIS — S065X9A Traumatic subdural hemorrhage with loss of consciousness of unspecified duration, initial encounter: Secondary | ICD-10-CM

## 2015-04-08 DIAGNOSIS — S0291XG Unspecified fracture of skull, subsequent encounter for fracture with delayed healing: Secondary | ICD-10-CM

## 2015-04-08 DIAGNOSIS — R748 Abnormal levels of other serum enzymes: Secondary | ICD-10-CM | POA: Diagnosis not present

## 2015-04-08 DIAGNOSIS — I6200 Nontraumatic subdural hemorrhage, unspecified: Secondary | ICD-10-CM

## 2015-04-08 DIAGNOSIS — S065XAA Traumatic subdural hemorrhage with loss of consciousness status unknown, initial encounter: Secondary | ICD-10-CM

## 2015-04-08 NOTE — Progress Notes (Signed)
Patient ID: Vanessa Hoffman, female   DOB: 1956/05/24, 59 y.o.   MRN: [id number]      This is an acute visit.  Level care skilled.  Facility [facility]         No Known Allergies  Chief Complaint  Patient presents with  . Acute Visit   acute visit follow-up subdural hematoma-elevated liver function tests  HPI:  Patirent is a resident of this facility who is here for short term rehab after suffering a skull fracture. She had had falls home; with worsening lower extremity weakness.t upon her admission to this facility she was able to stand and ambulate several steps. She is now unable to bear her body weight upon her legs. She is starting to ignore her left arm. She has had a decline in her cognition. She is having increased difficulty holding up her head She did have a CT scan of her head on August 4 which showed interval increase of the subdural hematoma on the right.  Her status remains relatively unchanged-.  Patient did see neurosurgeon on August 10 with recommendation that she will need a craniotomy-it is my understanding from nursing staff that neurology is awaiting consent from family-since patient is not not able to really give approval.  Currently she has no complaints again she does continue with changes as noted above vital signs are stable.  She did have some mildly elevated liver function tests in the hospital these have improved somewhat alkaline phosphatase is 108 this was 128 on August 3.  AST T is elevated at 142 it had been as high as 183 on 03/04/2015.  ALT is 64 it was 78 on August 3.         Past Medical History  Diagnosis Date  . Anxiety   . Fibromyalgia   . Osteoporosis   . Depression   . Hyperlipidemia   . Hypertension   . Substance abuse     ETOH, reportedly in past, l  . Arthritis   . DDD (degenerative disc disease)   . Diabetes mellitus without complication     Past Surgical History  Procedure Laterality Date  . Cervical disc  surgery  2001    3 fusions  . Cesarean section      2    VITAL SIGNS BP 128/73 mmHg  Pulse 92  Temp(Src) 97 F (36.1 C)  Resp 18  Patient's Medications  New Prescriptions   No medications on file  Previous Medications   AMITRIPTYLINE (ELAVIL) 50 MG TABLET    Take 50 mg by mouth 2 (two) times daily.   DONEPEZIL (ARICEPT) 10 MG TABLET    Take 10 mg by mouth at bedtime.   FOLIC ACID (FOLVITE) 1 MG TABLET    Take 1 tablet (1 mg total) by mouth daily.   HYDROCODONE-ACETAMINOPHEN (NORCO/VICODIN) 5-325 MG PER TABLET    Take 1 tablet by mouth every 12 (twelve) hours as needed for severe pain.   IBUPROFEN (ADVIL,MOTRIN) 600 MG TABLET    Take 1 tablet (600 mg total) by mouth every 6 (six) hours as needed for headache or moderate pain.   MECLIZINE (ANTIVERT) 25 MG TABLET    Take 1 tablet (25 mg total) by mouth 2 (two) times daily as needed for dizziness.   MULTIPLE VITAMINS-IRON (MULTIVITAMINS WITH IRON) TABS    Take 1 tablet by mouth daily. Vitamin supplement.   PRAVASTATIN (PRAVACHOL) 20 MG TABLET    Take 1 tablet (20 mg total) by mouth at  bedtime. For cholesterol management.   PROMETHAZINE (PHENERGAN) 25 MG TABLET    Take 1 tablet (25 mg total) by mouth every 6 (six) hours as needed for nausea or vomiting.   SERTRALINE (ZOLOFT) 100 MG TABLET    Take 200 mg by mouth daily.   THIAMINE 100 MG TABLET    Take 1 tablet (100 mg total) by mouth daily.  Modified Medications   No medications on file  Discontinued Medications   No medications on file     SIGNIFICANT DIAGNOSTIC EXAMS 03/28/2015-mildly increased size of right sided subdural hematoma  03-02-15: ct of head and cervical spine: Nondisplaced fracture through posterior left calvarium extending tothe skullbase, possibly extending to left occipital condyle. Isodense fluid measuring up to 5 mm overlying the right cerebral hemisphere with mild right to left midline shift. This may represent a chronic subdural hematoma or potentially an acute  hygroma. This collection was not present on prior CT 12/17/2007.  Anterior cervical spinal fusion hardware from the C5-C7 levels with associated C4-5 degenerative disc disease and focal kyphosis at this level.  No definite evidence for acute cervical spine fracture. Chronic small vessel ischemic change.  03-02-15; lumbar spine x-ray: Mild anterior height loss of the L1 vertebral body, which appears to have been present on MR 01/22/2007. Recommend correlation for point tenderness. L1-2 degenerative disc disease.  03-02-15: thoracic spine x-ray: No acute abnormalities.   03-04-15: 2-d echo: Left ventricle: The cavity size was normal. Wall thickness was normal. Systolic function was normal. The estimated ejection fraction was in the range of 55% to 60%. Wall motion was normal; there were no regional wall motion abnormalities. Doppler parameters are consistent with abnormal left ventricular relaxation (grade 1 diastolic dysfunction). - Mitral valve: Calcified annulus.   LABS REVIEWED:   03-02-15: wbc 11.3; hgb 11.2; hct 33.4; mcv 99.7; plt 144; glucose 135; bun 12; creat 1.28; k+3.1; na++136 03-03-15: wbc 9.5; hgb 10.2; hct 30.2; mcv 101.7; plt 126; glucose 132; bun 9; creat 1.18; k+2.9; na++136; mag 1.0; tsh 4.473; hgb a1c 6.3 03-04-15; wbc 9.6; hgb 10.1; hct 30.6; mcv 100.0; plt 138; glucose 140; bun 7; creat 0.89; k+4.0; na++135; ast 183; albumin 2.2; mag 1.5; vit b12: 1352; folate 24.5      Review of Systems  Unable to perform ROS: Dementia      Physical Exam  Vitals reviewed. temperature 97.0 pulse 92 respirations 18 blood pressure 128/73 Constitutional: No distress lying comfortably in bed in  Her skin is warm and dry she does have petechia bruising most herupperarmsthisappearstobeincreasedsomewhatoverthepastweek.  Eyes: Conjunctivae are normal.  Neck: Neck supple. No JVD present. No thyromegaly present.  Cardiovascular: Normal rate, regular rhythm and intact distal pulses.     Respiratory: Effort normal and breath sounds normal. No respiratory distress. She has no wheezes.  GI: Soft. Bowel sounds are normal. She exhibits no distension. There is no tenderness.  Musculoskeletal: She exhibits no edema . Neuro logic she is alert on them has somewhat slightly slurred speech which is not new recently-continues to have some mild weakness on the left compared to the right changes as noted above were she has difficulty bearing weight    Psychiatric: She has a normal mood and affect pleasent and appropriate.      labs.  04/05/2015.  WBC 11.6 neutrophils are within normal limits aside a feels mildly elevated.  Hemoglobin 9.2 platelets 158.  Sodium 136 potassium 3.6 BUN 18 creatinine 1.2.  Liver function tests as noted above.  ASSESSMENT/ PLAN:  Skull fracture Falls Weakness  Clinically this appears at baseline over the past week or so-she has seen neurology with recommendation for a craniotomy-this was discussed with her mother in the facility today apparently she will talk with other family members before pursuing this although this appears to be likely     #2 history of elevated liver function tests this appears to be moderating somewhat she is on a statin will discuss this with Dr. Lyn Hollingshead about possibly holding her discontinuing the statin although again the function tests are improving somewhat.  #3 petechia platelet level is within normal limits one wonder possibly if this is related to her liver function tests again will discuss this with Dr. Lyn Hollingshead.  [id number] note greater than 30 minutes spent assessing patient-reviewing her chart-discussing her status with nursing staff as well as with her mother in the facility-and coordinating plan of care-of note greater than 50% of time spent coordinating plan of care

## 2015-04-15 ENCOUNTER — Encounter: Payer: Self-pay | Admitting: Internal Medicine

## 2015-04-15 ENCOUNTER — Emergency Department (HOSPITAL_COMMUNITY): Payer: Medicare Other

## 2015-04-15 ENCOUNTER — Non-Acute Institutional Stay (SKILLED_NURSING_FACILITY): Payer: Medicare Other | Admitting: Internal Medicine

## 2015-04-15 ENCOUNTER — Encounter (HOSPITAL_COMMUNITY): Payer: Self-pay | Admitting: Emergency Medicine

## 2015-04-15 ENCOUNTER — Inpatient Hospital Stay (HOSPITAL_COMMUNITY)
Admission: EM | Admit: 2015-04-15 | Discharge: 2015-05-01 | DRG: 441 | Disposition: A | Discharge status: Hospice | Payer: Medicare Other | Attending: Internal Medicine | Admitting: Internal Medicine

## 2015-04-15 DIAGNOSIS — R4182 Altered mental status, unspecified: Secondary | ICD-10-CM

## 2015-04-15 DIAGNOSIS — F0781 Postconcussional syndrome: Secondary | ICD-10-CM | POA: Diagnosis present

## 2015-04-15 DIAGNOSIS — N390 Urinary tract infection, site not specified: Secondary | ICD-10-CM | POA: Diagnosis present

## 2015-04-15 DIAGNOSIS — F0150 Vascular dementia without behavioral disturbance: Secondary | ICD-10-CM | POA: Diagnosis present

## 2015-04-15 DIAGNOSIS — R791 Abnormal coagulation profile: Secondary | ICD-10-CM | POA: Diagnosis present

## 2015-04-15 DIAGNOSIS — F17210 Nicotine dependence, cigarettes, uncomplicated: Secondary | ICD-10-CM | POA: Diagnosis present

## 2015-04-15 DIAGNOSIS — D638 Anemia in other chronic diseases classified elsewhere: Secondary | ICD-10-CM | POA: Diagnosis present

## 2015-04-15 DIAGNOSIS — E870 Hyperosmolality and hypernatremia: Secondary | ICD-10-CM | POA: Diagnosis present

## 2015-04-15 DIAGNOSIS — E876 Hypokalemia: Secondary | ICD-10-CM | POA: Diagnosis not present

## 2015-04-15 DIAGNOSIS — G9340 Encephalopathy, unspecified: Secondary | ICD-10-CM | POA: Diagnosis present

## 2015-04-15 DIAGNOSIS — R4020 Unspecified coma: Secondary | ICD-10-CM | POA: Diagnosis not present

## 2015-04-15 DIAGNOSIS — D696 Thrombocytopenia, unspecified: Secondary | ICD-10-CM | POA: Diagnosis present

## 2015-04-15 DIAGNOSIS — I6200 Nontraumatic subdural hemorrhage, unspecified: Secondary | ICD-10-CM

## 2015-04-15 DIAGNOSIS — I10 Essential (primary) hypertension: Secondary | ICD-10-CM | POA: Diagnosis present

## 2015-04-15 DIAGNOSIS — I6203 Nontraumatic chronic subdural hemorrhage: Secondary | ICD-10-CM | POA: Diagnosis present

## 2015-04-15 DIAGNOSIS — Z4659 Encounter for fitting and adjustment of other gastrointestinal appliance and device: Secondary | ICD-10-CM

## 2015-04-15 DIAGNOSIS — K7011 Alcoholic hepatitis with ascites: Secondary | ICD-10-CM | POA: Diagnosis present

## 2015-04-15 DIAGNOSIS — K7290 Hepatic failure, unspecified without coma: Secondary | ICD-10-CM | POA: Diagnosis not present

## 2015-04-15 DIAGNOSIS — Z79891 Long term (current) use of opiate analgesic: Secondary | ICD-10-CM | POA: Diagnosis not present

## 2015-04-15 DIAGNOSIS — R109 Unspecified abdominal pain: Secondary | ICD-10-CM

## 2015-04-15 DIAGNOSIS — Z981 Arthrodesis status: Secondary | ICD-10-CM

## 2015-04-15 DIAGNOSIS — K760 Fatty (change of) liver, not elsewhere classified: Secondary | ICD-10-CM | POA: Diagnosis present

## 2015-04-15 DIAGNOSIS — R627 Adult failure to thrive: Secondary | ICD-10-CM | POA: Diagnosis present

## 2015-04-15 DIAGNOSIS — R1310 Dysphagia, unspecified: Secondary | ICD-10-CM | POA: Diagnosis present

## 2015-04-15 DIAGNOSIS — B961 Klebsiella pneumoniae [K. pneumoniae] as the cause of diseases classified elsewhere: Secondary | ICD-10-CM | POA: Diagnosis present

## 2015-04-15 DIAGNOSIS — R112 Nausea with vomiting, unspecified: Secondary | ICD-10-CM

## 2015-04-15 DIAGNOSIS — M810 Age-related osteoporosis without current pathological fracture: Secondary | ICD-10-CM | POA: Diagnosis present

## 2015-04-15 DIAGNOSIS — K7460 Unspecified cirrhosis of liver: Secondary | ICD-10-CM

## 2015-04-15 DIAGNOSIS — Z66 Do not resuscitate: Secondary | ICD-10-CM | POA: Diagnosis not present

## 2015-04-15 DIAGNOSIS — E872 Acidosis: Secondary | ICD-10-CM | POA: Diagnosis present

## 2015-04-15 DIAGNOSIS — F419 Anxiety disorder, unspecified: Secondary | ICD-10-CM | POA: Diagnosis present

## 2015-04-15 DIAGNOSIS — S065X9A Traumatic subdural hemorrhage with loss of consciousness of unspecified duration, initial encounter: Secondary | ICD-10-CM

## 2015-04-15 DIAGNOSIS — N179 Acute kidney failure, unspecified: Secondary | ICD-10-CM | POA: Diagnosis present

## 2015-04-15 DIAGNOSIS — S065XAA Traumatic subdural hemorrhage with loss of consciousness status unknown, initial encounter: Secondary | ICD-10-CM

## 2015-04-15 DIAGNOSIS — S0291XD Unspecified fracture of skull, subsequent encounter for fracture with routine healing: Secondary | ICD-10-CM | POA: Diagnosis not present

## 2015-04-15 DIAGNOSIS — Z8249 Family history of ischemic heart disease and other diseases of the circulatory system: Secondary | ICD-10-CM | POA: Diagnosis not present

## 2015-04-15 DIAGNOSIS — K7200 Acute and subacute hepatic failure without coma: Secondary | ICD-10-CM | POA: Diagnosis present

## 2015-04-15 DIAGNOSIS — E119 Type 2 diabetes mellitus without complications: Secondary | ICD-10-CM | POA: Diagnosis present

## 2015-04-15 DIAGNOSIS — Z9181 History of falling: Secondary | ICD-10-CM | POA: Diagnosis not present

## 2015-04-15 DIAGNOSIS — M797 Fibromyalgia: Secondary | ICD-10-CM | POA: Diagnosis present

## 2015-04-15 DIAGNOSIS — Z79899 Other long term (current) drug therapy: Secondary | ICD-10-CM

## 2015-04-15 DIAGNOSIS — Z515 Encounter for palliative care: Secondary | ICD-10-CM | POA: Insufficient documentation

## 2015-04-15 DIAGNOSIS — R531 Weakness: Secondary | ICD-10-CM | POA: Diagnosis not present

## 2015-04-15 DIAGNOSIS — R1110 Vomiting, unspecified: Secondary | ICD-10-CM

## 2015-04-15 DIAGNOSIS — R296 Repeated falls: Secondary | ICD-10-CM | POA: Diagnosis present

## 2015-04-15 DIAGNOSIS — F329 Major depressive disorder, single episode, unspecified: Secondary | ICD-10-CM | POA: Diagnosis present

## 2015-04-15 DIAGNOSIS — D649 Anemia, unspecified: Secondary | ICD-10-CM | POA: Diagnosis present

## 2015-04-15 DIAGNOSIS — F1010 Alcohol abuse, uncomplicated: Secondary | ICD-10-CM | POA: Diagnosis not present

## 2015-04-15 DIAGNOSIS — R410 Disorientation, unspecified: Secondary | ICD-10-CM | POA: Diagnosis not present

## 2015-04-15 DIAGNOSIS — R5383 Other fatigue: Secondary | ICD-10-CM

## 2015-04-15 DIAGNOSIS — E785 Hyperlipidemia, unspecified: Secondary | ICD-10-CM | POA: Diagnosis present

## 2015-04-15 DIAGNOSIS — K7682 Hepatic encephalopathy: Secondary | ICD-10-CM | POA: Diagnosis present

## 2015-04-15 DIAGNOSIS — R748 Abnormal levels of other serum enzymes: Secondary | ICD-10-CM | POA: Diagnosis present

## 2015-04-15 DIAGNOSIS — Z0189 Encounter for other specified special examinations: Secondary | ICD-10-CM

## 2015-04-15 DIAGNOSIS — E8729 Other acidosis: Secondary | ICD-10-CM | POA: Diagnosis present

## 2015-04-15 LAB — COMPREHENSIVE METABOLIC PANEL
ALBUMIN: 2.7 g/dL — AB (ref 3.5–5.0)
ALK PHOS: 103 U/L (ref 38–126)
ALT: 86 U/L — ABNORMAL HIGH (ref 14–54)
ANION GAP: 23 — AB (ref 5–15)
AST: 242 U/L — AB (ref 15–41)
BUN: 25 mg/dL — ABNORMAL HIGH (ref 6–20)
CALCIUM: 9.2 mg/dL (ref 8.9–10.3)
CO2: 13 mmol/L — ABNORMAL LOW (ref 22–32)
Chloride: 108 mmol/L (ref 101–111)
Creatinine, Ser: 2.05 mg/dL — ABNORMAL HIGH (ref 0.44–1.00)
GFR calc Af Amer: 30 mL/min — ABNORMAL LOW (ref >60)
GFR calc non Af Amer: 26 mL/min — ABNORMAL LOW (ref >60)
GLUCOSE: 76 mg/dL (ref 65–99)
Potassium: 4 mmol/L (ref 3.5–5.1)
Sodium: 144 mmol/L (ref 135–145)
Total Bilirubin: 2.5 mg/dL — ABNORMAL HIGH (ref 0.3–1.2)
Total Protein: 7 g/dL (ref 6.5–8.1)

## 2015-04-15 LAB — URINE MICROSCOPIC-ADD ON

## 2015-04-15 LAB — URINALYSIS, ROUTINE W REFLEX MICROSCOPIC
BILIRUBIN URINE: NEGATIVE
Glucose, UA: NEGATIVE mg/dL
Ketones, ur: 40 mg/dL — AB
Nitrite: POSITIVE — AB
PH: 6 (ref 5.0–8.0)
Protein, ur: NEGATIVE mg/dL
SPECIFIC GRAVITY, URINE: 1.017 (ref 1.005–1.030)
Urobilinogen, UA: 1 mg/dL (ref 0.0–1.0)

## 2015-04-15 LAB — AMMONIA: Ammonia: 110 umol/L — ABNORMAL HIGH (ref 9–35)

## 2015-04-15 LAB — CBC WITH DIFFERENTIAL/PLATELET
BASOS ABS: 0.1 10*3/uL (ref 0.0–0.1)
BASOS PCT: 1 % (ref 0–1)
EOS ABS: 0.3 10*3/uL (ref 0.0–0.7)
Eosinophils Relative: 3 % (ref 0–5)
HEMATOCRIT: 25.7 % — AB (ref 36.0–46.0)
HEMOGLOBIN: 8.7 g/dL — AB (ref 12.0–15.0)
Lymphocytes Relative: 20 % (ref 12–46)
Lymphs Abs: 2.3 10*3/uL (ref 0.7–4.0)
MCH: 31.6 pg (ref 26.0–34.0)
MCHC: 33.9 g/dL (ref 30.0–36.0)
MCV: 93.5 fL (ref 78.0–100.0)
Monocytes Absolute: 1.2 10*3/uL — ABNORMAL HIGH (ref 0.1–1.0)
Monocytes Relative: 11 % (ref 3–12)
NEUTROS PCT: 65 % (ref 43–77)
Neutro Abs: 7.4 10*3/uL (ref 1.7–7.7)
Platelets: 181 10*3/uL (ref 150–400)
RBC: 2.75 MIL/uL — ABNORMAL LOW (ref 3.87–5.11)
RDW: 15.8 % — ABNORMAL HIGH (ref 11.5–15.5)
WBC: 11.3 10*3/uL — ABNORMAL HIGH (ref 4.0–10.5)

## 2015-04-15 LAB — I-STAT CG4 LACTIC ACID, ED: Lactic Acid, Venous: 0.66 mmol/L (ref 0.5–2.0)

## 2015-04-15 LAB — CBG MONITORING, ED
GLUCOSE-CAPILLARY: 57 mg/dL — AB (ref 65–99)
GLUCOSE-CAPILLARY: 151 mg/dL — AB (ref 65–99)
Glucose-Capillary: 68 mg/dL (ref 65–99)

## 2015-04-15 MED ORDER — CEFTRIAXONE SODIUM 1 G IJ SOLR
1.0000 g | Freq: Once | INTRAMUSCULAR | Status: AC
  Administered 2015-04-15: 1 g via INTRAVENOUS
  Filled 2015-04-15: qty 10

## 2015-04-15 MED ORDER — DEXTROSE-NACL 5-0.45 % IV SOLN
INTRAVENOUS | Status: DC
  Administered 2015-04-15 – 2015-04-22 (×7): via INTRAVENOUS

## 2015-04-15 MED ORDER — DEXTROSE 50 % IV SOLN
INTRAVENOUS | Status: AC
  Administered 2015-04-15: 50 mL
  Filled 2015-04-15: qty 50

## 2015-04-15 NOTE — ED Provider Notes (Signed)
Patient presented to the ER with altered mental status. Patient presents from skilled nursing facility. Patient has history of fall with subdural hematoma. Patient noted to be extremely weak, less responsive today according to nursing home staff.  Face to face Exam: HEENT - PERRLA Lungs - CTAB Heart - RRR, no M/R/G Abd - S/NT/ND Neuro - somnolent  Plan: CT scan performed to further evaluate for worsening subdural. CT scan appears improved today. Further workup for acute encephalopathy.  Gilda Crease, MD 04/15/15 [phone]

## 2015-04-15 NOTE — H&P (Signed)
[facility] History and Physical  Vanessa Hoffman WUJ:[id number] DOB: 04-11-1956 DOA: 04/15/2015  Referring physician: Gilda Crease, MD PCP: No primary care provider on file.   Chief Complaint: Altered mental status.  HPI: Vanessa Hoffman is a 59 y.o. female with bellow PMH who is transferred from a local nursing home, where she was discharged to following a skull fracture and subdural hematoma early last month. There is not much information available, but apparently the patient is typically able to speak and have a conversation. Today in the afternoon patient was found altered and not responding. To the nursing home knowledge, there hasn't been any history of trauma, fever, nausea, emesis, diarrhea or any other symptoms. Her parents state that her appetite and oral intake have been decreased for the past few days.  She is currently in no acute distress, but continues to have altered mental status.   Review of Systems:  Unable to review due to the patient's mental status.  Past Medical History  Diagnosis Date  . Anxiety   . Fibromyalgia   . Osteoporosis   . Depression   . Hyperlipidemia   . Hypertension   . Substance abuse     ETOH, reportedly in past, l  . Arthritis   . DDD (degenerative disc disease)   . Diabetes mellitus without complication    Past Surgical History  Procedure Laterality Date  . Cervical disc surgery  2001    3 fusions  . Cesarean section      2   Social History:  reports that she has been smoking Cigarettes.  She has a 1 pack-year smoking history. She has never used smokeless tobacco. She reports that she does not drink alcohol or use illicit drugs.  No Known Allergies  Family History  Problem Relation Age of Onset  . Heart disease Father     Prior to Admission medications   Medication Sig Start Date End Date Taking? Authorizing Provider  amitriptyline (ELAVIL) 50 MG tablet Take 50 mg by mouth 2 (two) times daily.   Yes  Historical Provider, MD  donepezil (ARICEPT) 10 MG tablet Take 10 mg by mouth at bedtime.   Yes Historical Provider, MD  HYDROcodone-acetaminophen (NORCO/VICODIN) 5-325 MG per tablet Take 1 tablet by mouth every 12 (twelve) hours as needed for severe pain. 03/05/15  Yes Lora Paula, MD  ibuprofen (ADVIL,MOTRIN) 600 MG tablet Take 1 tablet (600 mg total) by mouth every 6 (six) hours as needed for headache or moderate pain. 03/05/15  Yes Lora Paula, MD  meclizine (ANTIVERT) 25 MG tablet Take 1 tablet (25 mg total) by mouth 2 (two) times daily as needed for dizziness. 03/05/15  Yes Lora Paula, MD  Multiple Vitamins-Iron (MULTIVITAMINS WITH IRON) TABS Take 1 tablet by mouth daily. Vitamin supplement. 08/21/11  Yes Viviann Spare, FNP  pravastatin (PRAVACHOL) 20 MG tablet Take 1 tablet (20 mg total) by mouth at bedtime. For cholesterol management. 08/21/11  Yes Viviann Spare, FNP  promethazine (PHENERGAN) 25 MG tablet Take 1 tablet (25 mg total) by mouth every 6 (six) hours as needed for nausea or vomiting. 03/05/15  Yes Lora Paula, MD  sertraline (ZOLOFT) 100 MG tablet Take 200 mg by mouth daily.   Yes Historical Provider, MD  thiamine 100 MG tablet Take 1 tablet (100 mg total) by mouth daily. 03/05/15  Yes Lora Paula, MD  folic acid (FOLVITE) 1 MG tablet Take 1 tablet (1 mg total) by mouth  daily. 03/05/15   Lora Paula, MD   Physical Exam: Filed Vitals:   04/15/15 2000 04/15/15 2016 04/15/15 2100 04/15/15 2130  BP: 127/58 132/57 111/50 124/58  Pulse: 106 106 108 104  Temp:      TempSrc:      Resp: Height:      Weight:      SpO2: 100% 99% 99% 99%    Wt Readings from Last 3 Encounters:  04/15/15 86.183 kg (190 lb)  03/28/15 87.544 kg (193 lb)  03/28/15 86.002 kg (189 lb 9.6 oz)    General:  Responsive briefly to verbal stimuli. Eyes: PERRL, normal lids, irises & conjunctiva ENT: grossly normal hearing, lips & tongue are very dry Neck: no LAD,  masses or thyromegaly Cardiovascular: RRR, no m/r/g. No LE edema. Telemetry: SR, no arrhythmias  Respiratory: CTA bilaterally, no w/r/r. Normal respiratory effort. Anterior exam. Abdomen: soft, ntnd Skin: Multiple ecchymotic areas on her extremities. Musculoskeletal: Decreased muscular time. Psychiatric: Altered mental status. Neurologic: Minimal response to verbal and tactile stimuli.            Labs on Admission:  Basic Metabolic Panel:  Recent Labs Lab 04/15/15 1843  NA 144  K 4.0  CL 108  CO2 13*  GLUCOSE 76  BUN 25*  CREATININE 2.05*  CALCIUM 9.2   Liver Function Tests:  Recent Labs Lab 04/15/15 1843  AST 242*  ALT 86*  ALKPHOS 103  BILITOT 2.5*  PROT 7.0  ALBUMIN 2.7*   No results for input(s): LIPASE, AMYLASE in the last 168 hours.  Recent Labs Lab 04/15/15 1843  AMMONIA 110*   CBC:  Recent Labs Lab 04/15/15 1843  WBC 11.3*  NEUTROABS 7.4  HGB 8.7*  HCT 25.7*  MCV 93.5  PLT 181   Cardiac Enzymes: No results for input(s): CKTOTAL, CKMB, CKMBINDEX, TROPONINI in the last 168 hours.  BNP (last 3 results) No results for input(s): BNP in the last 8760 hours.  ProBNP (last 3 results) No results for input(s): PROBNP in the last 8760 hours.  CBG:  Recent Labs Lab 04/15/15 1757 04/15/15 2050  GLUCAP 68 57*    Radiological Exams on Admission: Ct Head Wo Contrast  04/15/2015   CLINICAL DATA:  Altered mental status. Frequent falls. Skull fracture and chronic subdural hematoma.  EXAM: CT HEAD WITHOUT CONTRAST  TECHNIQUE: Contiguous axial images were obtained from the base of the skull through the vertex without intravenous contrast.  COMPARISON:  03/28/2015 and 03/02/2015  FINDINGS: Sinuses/Soft tissues: Re- demonstration of a nondisplaced left occipital bone fracture, including on image 14. No new fracture. Clear paranasal sinuses and mastoid air cells.  Intracranial: Mild to moderate low density in the periventricular white matter likely  related to small vessel disease. Age advanced cerebral atrophy. mixed attenuation subdural collection adjacent the right cerebral hemisphere is minimally decreased. Measures maximally 11 mm on image 22 today versus 13 mm at the same level on the prior. 2-3 mm of right-to-left midline shift are similar. No sulci effacement. No hydrocephalus, new fluid collection, mass lesion.  IMPRESSION: 1. Minimal decrease in size of a right sided mixed acuity subdural hematoma. Similar minimal right-to-left midline shift. 2. No new superimposed process or explanation for mental status changes. 3.  Cerebral atrophy and small vessel ischemic change. 4. Similar left occipital nondisplaced skull fracture.   Electronically Signed   By: Jeronimo Greaves M.D.   On: 04/15/2015 18:44   Dg Chest Portable 1 View  04/15/2015  CLINICAL DATA:  Altered mental status for 1 day. History of hypertension, diabetes, smoker.  EXAM: PORTABLE CHEST - 1 VIEW  COMPARISON:  04/04/2010  FINDINGS: Mild cardiac enlargement. Pulmonary vascularity is normal. No focal airspace disease or consolidation in the lungs. No blunting of costophrenic angles. No pneumothorax. Postoperative changes in the cervical spine.  IMPRESSION: Mild cardiac enlargement.  No evidence of active pulmonary disease.   Electronically Signed   By: Burman Nieves M.D.   On: 04/15/2015 18:31    EKG: Independently reviewed. Vent. rate 108 BPM PR interval 138 ms QRS duration 106 ms QT/QTc 392/525 ms P-R-T axes 33 243 18  Sinus tachycardia Incomplete RBBB and LAFB Low voltage, extremity and precordial leads RSR' in V1 or V2, right VCD or RVH Prolonged QT interval  Assessment/Plan Principal Problem:   Change in mental status Admit to stepdown for close monitoring. Neuro checks. Workup has been unremarkable, except for possible urinary tract infection. Treat UTI. Follow-up blood and urine cultures and sensitivity.  Active Problems:   Vascular dementia   Post  concussive syndrome Per her parents, she has a history of multiple falls with head injuries at home in [place], [place], but almost never went to the hospital after her husband would find her on the floor. However, her parents stated that she had a hospitalization about one of the [facility] a few months ago.     Hyperlipidemia Resume pravastatin once the patient's mental status is improved. Monitor LFTs periodically.   Hypertension Monitor blood pressure. Consider IV antihypertensives as needed, if the blood pressure increases and the patient's mental status does not improve.    Anemia Follow H&H and transfuse as needed.    AKI (acute kidney injury) Follow-up BUN and creatinine after IV hydration.    Code Status: Full code. DVT Prophylaxis: SCDs. Family Communication:  Her husband is her POA. He is currently at a skilled nursing facility in [place], [place] following neurosurgery for brain aneurysm. He is a scheduled to be discharged Wednesday.  Vanessa Hoffman Father [phone]     Vanessa Hoffman,Vanessa Hoffman Mother [phone]    Disposition Plan: Admit to stepdown, treat UTI  Time spent: Over 75 minutes.  Bobette Mo [facility] Pager [phone].

## 2015-04-15 NOTE — ED Notes (Signed)
All numbers in the computer correct for family

## 2015-04-15 NOTE — ED Notes (Signed)
Patient presents to the ed via [facility] for altered mental status, from [facility].  Staff went to feed patient dinner and it was noted that she was unresponsive.  Patient is alert but non-verbal and is not following commands.  She has a hx of frequent falls and on July 7th patient did fall and 2 days later had a CT scan here that showed a skull fracture and chronic subdural hematoma with a 2 mm right to left midline shift which was present on a prior ct on 12/17/14.

## 2015-04-15 NOTE — ED Notes (Signed)
Phlebotomy at the bedside for cultures. Updated family on plan for admission

## 2015-04-15 NOTE — ED Provider Notes (Signed)
History   Chief Complaint  Patient presents with  . Altered Mental Status    HPI 59 year old female with past medical history as below notable for diabetes, substance abuse, hypertension, fibromyalgia, recent admission following fall with subsequent skull fracture and SDH 7/9-7/12 who presents today from rehab facility for evaluation of altered mental status. EMS has limited information from nursing home. They report shortly afternoon today they found patient to be altered and not responding as normal. She typically can speak and hold a conversation with them and now she is not talking. They deny any recent falls, trauma, fevers or other symptoms. Prior to today she was in her typical state of health. History is limited at this time sending her to patient condition.  Level V cavity applies. Past medical/surgical history, social history, medications, allergies and FH have been reviewed with patient and/or in documentation. Furthermore, if pt family or friend(s) present, additional historical information was obtained from them.  Past Medical History  Diagnosis Date  . Anxiety   . Fibromyalgia   . Osteoporosis   . Depression   . Hyperlipidemia   . Hypertension   . Substance abuse     ETOH, reportedly in past, l  . Arthritis   . DDD (degenerative disc disease)   . Diabetes mellitus without complication    Past Surgical History  Procedure Laterality Date  . Cervical disc surgery  2001    3 fusions  . Cesarean section      2   Family History  Problem Relation Age of Onset  . Heart disease Father    Social History  Substance Use Topics  . Smoking status: Current Every Day Smoker -- 0.05 packs/day for 20 years    Types: Cigarettes  . Smokeless tobacco: Never Used  . Alcohol Use: No     Comment: Abuse 4 years ago;unclear if ongoing     Review of Systems Unable to obtain secondary to patient condition  Physical Exam  Physical Exam  ED Triage Vitals  Enc Vitals Group      BP 04/15/15 1754 139/64 mmHg     Pulse Rate 04/15/15 1754 110     Resp 04/15/15 1754 18     Temp 04/15/15 1754 98.3 F (36.8 C)     Temp Source 04/15/15 1754 Rectal     SpO2 04/15/15 1743 100 %     Weight 04/15/15 1754 190 lb (86.183 kg)     Height 04/15/15 1754 5\' 4"  (1.626 m)     Head Cir --      Peak Flow --      Pain Score --      Pain Loc --      Pain Edu? --      Excl. in GC? --    Constitutional: Chronically ill appearing 59 year old female who is awake, following with her eyes, and was able to speak her name once. Head: Normocephalic and atraumatic.  Eyes: Extraocular motion intact, no scleral icterus Mouth: MMM, OP clear Neck: Supple without meningismus, mass, or overt JVD Respiratory: No respiratory distress. Normal WOB. No w/r/g. CV: tachycardic, reg rate, no obvious murmurs.  Pulses +2 and symmetric. Euvolemic Abdomen: Soft, NT, ND, no r/g. No mass.  MSK: Extremities are atraumatic without deformity, ROM intact Skin: Warm, dry, intact without rash Neuro: alert to self only. HDS, PERRL, EOMI, TML, face sym. CN 2-12 grossly intact. 4/5 sym x 4, no drift, SILT, normal gait and coordination.    ED Course  Procedures  Labs Reviewed  CBC WITH DIFFERENTIAL/PLATELET - Abnormal; Notable for the following:    WBC 11.3 (*)    RBC 2.75 (*)    Hemoglobin 8.7 (*)    HCT 25.7 (*)    RDW 15.8 (*)    Monocytes Absolute 1.2 (*)    All other components within normal limits  COMPREHENSIVE METABOLIC PANEL - Abnormal; Notable for the following:    CO2 13 (*)    BUN 25 (*)    Creatinine, Ser 2.05 (*)    Albumin 2.7 (*)    AST 242 (*)    ALT 86 (*)    Total Bilirubin 2.5 (*)    GFR calc non Af Amer 26 (*)    GFR calc Af Amer 30 (*)    Anion gap 23 (*)    All other components within normal limits  URINALYSIS, ROUTINE W REFLEX MICROSCOPIC (NOT AT [facility]) - Abnormal; Notable for the following:    Color, Urine AMBER (*)    APPearance CLOUDY (*)    Hgb urine dipstick TRACE  (*)    Ketones, ur 40 (*)    Nitrite POSITIVE (*)    Leukocytes, UA MODERATE (*)    All other components within normal limits  AMMONIA - Abnormal; Notable for the following:    Ammonia 110 (*)    All other components within normal limits  URINE MICROSCOPIC-ADD ON - Abnormal; Notable for the following:    Bacteria, UA MANY (*)    All other components within normal limits  CULTURE, BLOOD (ROUTINE X 2)  CULTURE, BLOOD (ROUTINE X 2)  URINE CULTURE  CBG MONITORING, ED  I-STAT CG4 LACTIC ACID, ED   I personally reviewed and interpreted all labs.  Ct Head Wo Contrast  04/15/2015   CLINICAL DATA:  Altered mental status. Frequent falls. Skull fracture and chronic subdural hematoma.  EXAM: CT HEAD WITHOUT CONTRAST  TECHNIQUE: Contiguous axial images were obtained from the base of the skull through the vertex without intravenous contrast.  COMPARISON:  03/28/2015 and 03/02/2015  FINDINGS: Sinuses/Soft tissues: Re- demonstration of a nondisplaced left occipital bone fracture, including on image 14. No new fracture. Clear paranasal sinuses and mastoid air cells.  Intracranial: Mild to moderate low density in the periventricular white matter likely related to small vessel disease. Age advanced cerebral atrophy. mixed attenuation subdural collection adjacent the right cerebral hemisphere is minimally decreased. Measures maximally 11 mm on image 22 today versus 13 mm at the same level on the prior. 2-3 mm of right-to-left midline shift are similar. No sulci effacement. No hydrocephalus, new fluid collection, mass lesion.  IMPRESSION: 1. Minimal decrease in size of a right sided mixed acuity subdural hematoma. Similar minimal right-to-left midline shift. 2. No new superimposed process or explanation for mental status changes. 3.  Cerebral atrophy and small vessel ischemic change. 4. Similar left occipital nondisplaced skull fracture.   Electronically Signed   By: Jeronimo Greaves M.D.   On: 04/15/2015 18:44   Dg  Chest Portable 1 View  04/15/2015   CLINICAL DATA:  Altered mental status for 1 day. History of hypertension, diabetes, smoker.  EXAM: PORTABLE CHEST - 1 VIEW  COMPARISON:  04/04/2010  FINDINGS: Mild cardiac enlargement. Pulmonary vascularity is normal. No focal airspace disease or consolidation in the lungs. No blunting of costophrenic angles. No pneumothorax. Postoperative changes in the cervical spine.  IMPRESSION: Mild cardiac enlargement.  No evidence of active pulmonary disease.   Electronically Signed   By: Burman Nieves  M.D.   On: 04/15/2015 18:31   I personally viewed above image(s) which were used in my medical decision making. Formal interpretations by Radiology.   EKG Interpretation  Date/Time:  Monday April 15 2015 18:02:04 EDT Ventricular Rate:  108 PR Interval:  138 QRS Duration: 106 QT Interval:  392 QTC Calculation: 525 R Axis:   -117 Text Interpretation:  Sinus tachycardia Incomplete RBBB and LAFB Low voltage, extremity and precordial leads RSR' in V1 or V2, right VCD or RVH Prolonged QT interval No significant change since last tracing Confirmed by POLLINA  MD, CHRISTOPHER [phone]) on 04/15/2015 6:57:13 PM       MDM: Mardi Mainland is a 59 y.o. female with H&P as above who p/w CC: altered mental status  On arrival, patient is hemodynamic stable and in no apparent distress. She is afebrile. She is not speaking but was able to tell me her name one time. Patient's neuro exam is otherwise benign. No signs of trauma. No signs of rashes. Patient will get screening labs, EKG, chest x-ray, CT head for further evaluation.  EKG is as above and unchanged from prior. Workup today is notable for UTI in setting of leukocytosis and acute kidney injury.. Cultures sent and Rocephin were given. Patient also has ammonia that is elevated today in the 100s. Chest x-ray is unremarkable.  Patient will be admitted to hospitalist service for further management.   Old records reviewed  (if available). Labs and imaging reviewed personally by myself and considered in medical decision making if ordered.   Clinical Impression: 1. Altered mental status, unspecified altered mental status type   2. UTI (lower urinary tract infection)   3. AKI (acute kidney injury)   4. Encephalopathy acute     Disposition: Admit  Condition: stable  I have discussed the results, Dx and Tx plan with the pt(& family if present). He/she/they expressed understanding and agree(s) with the plan.  Pt seen in conjunction with Dr. Gilda Crease, MD  Ames Dura, DO [facility] Emergency Medicine Resident - PGY-3     Ames Dura, MD 04/16/15 [id number]  Gilda Crease, MD 04/16/15 [phone]

## 2015-04-15 NOTE — ED Notes (Signed)
CBG 151. 

## 2015-04-15 NOTE — Progress Notes (Signed)
Patient ID: Vanessa Hoffman, female   DOB: 08/27/1955, 59 y.o.   MRN: [id number]       This is an acute visit.  Level care skilled.  Facility [facility]         No Known Allergies  Chief Complaint  Patient presents with  . Acute Visit   acute visit secondary to increased lethargy altered mental status   HPI:  Patirent is a resident of this facility who is here for short term rehab after suffering a skull fracture. She had had falls home; with worsening lower extremity weakness.t upon her admission to this facility she was able to stand and ambulate several steps. She became  unable to bear her body weight upon her legs. She started to ignore her left arm. She has had a decline in her cognition. Shehad increased difficulty holding up her head She did have a CT scan of her head on August 4 which showed interval increase of the subdural hematoma on the right.-.  Patient did see neurosurgeon on August 10 with recommendation that she will need a craniotomy-it is my understanding from nursing staff that neurology is awaiting consent from family-since patient is not not able to really give approval--and apparently they were scheduled to meetthis week---  \Nursing staff called me to the room this afternoon stating the patient has altered mental status is more lethargic is not responding like she usually does-talking with her nursing tech apparently this developed over the weekend and has worsened.  . Vital signs actually appear to be stable            Past Medical History  Diagnosis Date  . Anxiety   . Fibromyalgia   . Osteoporosis   . Depression   . Hyperlipidemia   . Hypertension   . Substance abuse     ETOH, reportedly in past, l  . Arthritis   . DDD (degenerative disc disease)   . Diabetes mellitus without complication     Past Surgical History  Procedure Laterality Date  . Cervical disc surgery  2001    3 fusions  . Cesarean section      2    VITAL  SIGNS BP 119/65 mmHg  Pulse 104  Temp(Src) 97 F (36.1 C)  Resp 17  Patient's Medications  New Prescriptions   No medications on file  Previous Medications   AMITRIPTYLINE (ELAVIL) 50 MG TABLET    Take 50 mg by mouth 2 (two) times daily.   DONEPEZIL (ARICEPT) 10 MG TABLET    Take 10 mg by mouth at bedtime.   FOLIC ACID (FOLVITE) 1 MG TABLET    Take 1 tablet (1 mg total) by mouth daily.   HYDROCODONE-ACETAMINOPHEN (NORCO/VICODIN) 5-325 MG PER TABLET    Take 1 tablet by mouth every 12 (twelve) hours as needed for severe pain.   IBUPROFEN (ADVIL,MOTRIN) 600 MG TABLET    Take 1 tablet (600 mg total) by mouth every 6 (six) hours as needed for headache or moderate pain.   MECLIZINE (ANTIVERT) 25 MG TABLET    Take 1 tablet (25 mg total) by mouth 2 (two) times daily as needed for dizziness.   MULTIPLE VITAMINS-IRON (MULTIVITAMINS WITH IRON) TABS    Take 1 tablet by mouth daily. Vitamin supplement.   PRAVASTATIN (PRAVACHOL) 20 MG TABLET    Take 1 tablet (20 mg total) by mouth at bedtime. For cholesterol management.   PROMETHAZINE (PHENERGAN) 25 MG TABLET    Take 1 tablet (25 mg  total) by mouth every 6 (six) hours as needed for nausea or vomiting.   SERTRALINE (ZOLOFT) 100 MG TABLET    Take 200 mg by mouth daily.   THIAMINE 100 MG TABLET    Take 1 tablet (100 mg total) by mouth daily.  Modified Medications   No medications on file  Discontinued Medications   No medications on file     SIGNIFICANT DIAGNOSTIC EXAMS 03/28/2015-mildly increased size of right sided subdural hematoma  03-02-15: ct of head and cervical spine: Nondisplaced fracture through posterior left calvarium extending tothe skullbase, possibly extending to left occipital condyle. Isodense fluid measuring up to 5 mm overlying the right cerebral hemisphere with mild right to left midline shift. This may represent a chronic subdural hematoma or potentially an acute hygroma. This collection was not present on prior CT 12/17/2007.    Anterior cervical spinal fusion hardware from the C5-C7 levels with associated C4-5 degenerative disc disease and focal kyphosis at this level.  No definite evidence for acute cervical spine fracture. Chronic small vessel ischemic change.  03-02-15; lumbar spine x-ray: Mild anterior height loss of the L1 vertebral body, which appears to have been present on MR 01/22/2007. Recommend correlation for point tenderness. L1-2 degenerative disc disease.  03-02-15: thoracic spine x-ray: No acute abnormalities.   03-04-15: 2-d echo: Left ventricle: The cavity size was normal. Wall thickness was normal. Systolic function was normal. The estimated ejection fraction was in the range of 55% to 60%. Wall motion was normal; there were no regional wall motion abnormalities. Doppler parameters are consistent with abnormal left ventricular relaxation (grade 1 diastolic dysfunction). - Mitral valve: Calcified annulus.   LABS REVIEWED:   03-02-15: wbc 11.3; hgb 11.2; hct 33.4; mcv 99.7; plt 144; glucose 135; bun 12; creat 1.28; k+3.1; na++136 03-03-15: wbc 9.5; hgb 10.2; hct 30.2; mcv 101.7; plt 126; glucose 132; bun 9; creat 1.18; k+2.9; na++136; mag 1.0; tsh 4.473; hgb a1c 6.3 03-04-15; wbc 9.6; hgb 10.1; hct 30.6; mcv 100.0; plt 138; glucose 140; bun 7; creat 0.89; k+4.0; na++135; ast 183; albumin 2.2; mag 1.5; vit b12: 1352; folate 24.5      Review of Systems  Unable to perform ROS: Secondary to increased lethargy is not really talking      Physical Exam  Temperature 97.0 pulse 104 respirations 18 blood pressure 109/65-CBG is 90 O2 sats ration is 100% on room air In general this is a lethargic  female who is resting in bed-she is attempting to talk but is more mumbling which is a change for her--she does not appear to be in acute distress but is significantly lethargic which is a different presentation than I have seen previously  Her skin is warm and dry she does have petechia bruising  upper extremities.   Eyes: Conjunctivae are normal.  pupils appear to be reactive to light Neck: Neck supple. No JVD present. No thyromegaly present.  Cardiovascular: Slightly tachycardic rate, regular rhythm slightly tachycardic at 104--l-2+ edema bilaterally lower extremities   Respiratory: Poor respiratory effort but could not appreciate any congestion. No respiratory distress. She has no wheezes.  GI: Soft. Bowel sounds are normal. She exhibits no distension. There is no tenderness.  Musculoskeletal:  she moves her extremities spontaneously but does not really follow verbal commands . Neuro logic Again significant lethargy she does move her extremities spontaneously-she attempts to speak but cannot really understand---seems more of a mumbling presentation     Psychiatric as noted above.      labs  .  04/05/2015.  WBC 11.6 neutrophils are within normal limits aside a feels mildly elevated.  Hemoglobin 9.2 platelets 158.  Sodium 136 potassium 3.6 BUN 18 creatinine 1.2.  Liver function tests as noted above.     ASSESSMENT/ PLAN:  #1 lethargy and altered mental status-this appears to be an acute change especially concerning with her history of a subdural hematoma and skull fracture-again craniotomy apparently was being considered at this time as noted above-one would be suspicious of possibly increased bleed here she will need to go to the ER for emergent evaluation.  Before EMS arrived I did do serial exams she remained relatively unchanged responsive minimally--but did not appear to be acutely regressing-- vital signs appear to be stable-again will need emergent evaluation and imaging  [id number]

## 2015-04-16 ENCOUNTER — Ambulatory Visit (HOSPITAL_COMMUNITY): Payer: Medicare Other

## 2015-04-16 ENCOUNTER — Inpatient Hospital Stay (HOSPITAL_COMMUNITY): Payer: Medicare Other

## 2015-04-16 DIAGNOSIS — K7682 Hepatic encephalopathy: Secondary | ICD-10-CM | POA: Diagnosis present

## 2015-04-16 DIAGNOSIS — F1010 Alcohol abuse, uncomplicated: Secondary | ICD-10-CM | POA: Diagnosis present

## 2015-04-16 DIAGNOSIS — F0150 Vascular dementia without behavioral disturbance: Secondary | ICD-10-CM

## 2015-04-16 DIAGNOSIS — F0781 Postconcussional syndrome: Secondary | ICD-10-CM

## 2015-04-16 DIAGNOSIS — K7290 Hepatic failure, unspecified without coma: Secondary | ICD-10-CM

## 2015-04-16 DIAGNOSIS — E872 Acidosis: Secondary | ICD-10-CM

## 2015-04-16 DIAGNOSIS — R748 Abnormal levels of other serum enzymes: Secondary | ICD-10-CM

## 2015-04-16 DIAGNOSIS — N179 Acute kidney failure, unspecified: Secondary | ICD-10-CM

## 2015-04-16 DIAGNOSIS — E8729 Other acidosis: Secondary | ICD-10-CM | POA: Diagnosis present

## 2015-04-16 DIAGNOSIS — R4020 Unspecified coma: Secondary | ICD-10-CM | POA: Diagnosis present

## 2015-04-16 DIAGNOSIS — R296 Repeated falls: Secondary | ICD-10-CM

## 2015-04-16 DIAGNOSIS — E870 Hyperosmolality and hypernatremia: Secondary | ICD-10-CM | POA: Diagnosis not present

## 2015-04-16 LAB — COMPREHENSIVE METABOLIC PANEL
ALT: 90 U/L — ABNORMAL HIGH (ref 14–54)
AST: 243 U/L — AB (ref 15–41)
Albumin: 2.7 g/dL — ABNORMAL LOW (ref 3.5–5.0)
Alkaline Phosphatase: 102 U/L (ref 38–126)
Anion gap: 17 — ABNORMAL HIGH (ref 5–15)
BUN: 24 mg/dL — ABNORMAL HIGH (ref 6–20)
CHLORIDE: 109 mmol/L (ref 101–111)
CO2: 17 mmol/L — ABNORMAL LOW (ref 22–32)
Calcium: 9 mg/dL (ref 8.9–10.3)
Creatinine, Ser: 1.88 mg/dL — ABNORMAL HIGH (ref 0.44–1.00)
GFR, EST AFRICAN AMERICAN: 33 mL/min — AB (ref >60)
GFR, EST NON AFRICAN AMERICAN: 29 mL/min — AB (ref >60)
Glucose, Bld: 136 mg/dL — ABNORMAL HIGH (ref 65–99)
POTASSIUM: 3.5 mmol/L (ref 3.5–5.1)
Sodium: 143 mmol/L (ref 135–145)
Total Bilirubin: 2.2 mg/dL — ABNORMAL HIGH (ref 0.3–1.2)
Total Protein: 7.1 g/dL (ref 6.5–8.1)

## 2015-04-16 LAB — RAPID URINE DRUG SCREEN, HOSP PERFORMED
Amphetamines: NOT DETECTED
Barbiturates: NOT DETECTED
Benzodiazepines: NOT DETECTED
COCAINE: NOT DETECTED
OPIATES: NOT DETECTED
Tetrahydrocannabinol: NOT DETECTED

## 2015-04-16 LAB — CBC WITH DIFFERENTIAL/PLATELET
BASOS ABS: 0.1 10*3/uL (ref 0.0–0.1)
Basophils Relative: 1 % (ref 0–1)
EOS PCT: 5 % (ref 0–5)
Eosinophils Absolute: 0.5 10*3/uL (ref 0.0–0.7)
HCT: 26.3 % — ABNORMAL LOW (ref 36.0–46.0)
Hemoglobin: 8.8 g/dL — ABNORMAL LOW (ref 12.0–15.0)
LYMPHS PCT: 18 % (ref 12–46)
Lymphs Abs: 2 10*3/uL (ref 0.7–4.0)
MCH: 31.2 pg (ref 26.0–34.0)
MCHC: 33.5 g/dL (ref 30.0–36.0)
MCV: 93.3 fL (ref 78.0–100.0)
MONO ABS: 1.2 10*3/uL — AB (ref 0.1–1.0)
Monocytes Relative: 11 % (ref 3–12)
Neutro Abs: 6.9 10*3/uL (ref 1.7–7.7)
Neutrophils Relative %: 65 % (ref 43–77)
PLATELETS: 166 10*3/uL (ref 150–400)
RBC: 2.82 MIL/uL — ABNORMAL LOW (ref 3.87–5.11)
RDW: 15.7 % — AB (ref 11.5–15.5)
WBC: 10.7 10*3/uL — ABNORMAL HIGH (ref 4.0–10.5)

## 2015-04-16 LAB — GLUCOSE, CAPILLARY: GLUCOSE-CAPILLARY: 164 mg/dL — AB (ref 65–99)

## 2015-04-16 LAB — VITAMIN B12: VITAMIN B 12: 1484 pg/mL — AB (ref 180–914)

## 2015-04-16 LAB — URIC ACID: Uric Acid, Serum: 11.8 mg/dL — ABNORMAL HIGH (ref 2.3–6.6)

## 2015-04-16 LAB — BLOOD GAS, ARTERIAL
Acid-base deficit: 6.2 mmol/L — ABNORMAL HIGH (ref 0.0–2.0)
Bicarbonate: 17.7 mEq/L — ABNORMAL LOW (ref 20.0–24.0)
DRAWN BY: 12971
FIO2: 0.21
O2 Saturation: 96.6 %
PATIENT TEMPERATURE: 98.6
PH ART: 7.394 (ref 7.350–7.450)
TCO2: 18.7 mmol/L (ref 0–100)
pCO2 arterial: 29.7 mmHg — ABNORMAL LOW (ref 35.0–45.0)
pO2, Arterial: 80.3 mmHg (ref 80.0–100.0)

## 2015-04-16 LAB — FERRITIN: Ferritin: 417 ng/mL — ABNORMAL HIGH (ref 11–307)

## 2015-04-16 LAB — CG4 I-STAT (LACTIC ACID): LACTIC ACID, VENOUS: 0.54 mmol/L (ref 0.5–2.0)

## 2015-04-16 LAB — RETICULOCYTES
RBC.: 2.77 MIL/uL — AB (ref 3.87–5.11)
Retic Count, Absolute: 38.8 10*3/uL (ref 19.0–186.0)
Retic Ct Pct: 1.4 % (ref 0.4–3.1)

## 2015-04-16 LAB — OSMOLALITY: Osmolality: 320 mOsm/kg — ABNORMAL HIGH (ref 275–300)

## 2015-04-16 LAB — AMMONIA: Ammonia: 76 umol/L — ABNORMAL HIGH (ref 9–35)

## 2015-04-16 LAB — ETHANOL: Alcohol, Ethyl (B): <5 mg/dL (ref <5)

## 2015-04-16 LAB — NA AND K (SODIUM & POTASSIUM), RAND UR: POTASSIUM UR: 55 mmol/L

## 2015-04-16 LAB — IRON AND TIBC
Iron: 66 ug/dL (ref 28–170)
SATURATION RATIOS: 34 % — AB (ref 10.4–31.8)
TIBC: 195 ug/dL — AB (ref 250–450)
UIBC: 129 ug/dL

## 2015-04-16 LAB — CHLORIDE, URINE, RANDOM: Chloride Urine: 33 mmol/L

## 2015-04-16 LAB — FOLATE: FOLATE: 37 ng/mL (ref >5.9)

## 2015-04-16 LAB — CBG MONITORING, ED
GLUCOSE-CAPILLARY: 126 mg/dL — AB (ref 65–99)
GLUCOSE-CAPILLARY: 138 mg/dL — AB (ref 65–99)

## 2015-04-16 LAB — OSMOLALITY, URINE: Osmolality, Ur: 508 mOsm/kg (ref 390–1090)

## 2015-04-16 LAB — MRSA PCR SCREENING: MRSA BY PCR: NEGATIVE

## 2015-04-16 MED ORDER — FAMOTIDINE IN NACL 20-0.9 MG/50ML-% IV SOLN
20.0000 mg | INTRAVENOUS | Status: DC
  Administered 2015-04-16 – 2015-04-24 (×9): 20 mg via INTRAVENOUS
  Filled 2015-04-16 (×9): qty 50

## 2015-04-16 MED ORDER — CEFTRIAXONE SODIUM 1 G IJ SOLR
1.0000 g | INTRAMUSCULAR | Status: DC
  Administered 2015-04-16 – 2015-04-21 (×6): 1 g via INTRAVENOUS
  Filled 2015-04-16 (×8): qty 10

## 2015-04-16 MED ORDER — LORAZEPAM 2 MG/ML IJ SOLN
INTRAMUSCULAR | Status: AC
  Administered 2015-04-16: 1 mg via INTRAVENOUS
  Filled 2015-04-16: qty 1

## 2015-04-16 MED ORDER — THIAMINE HCL 100 MG/ML IJ SOLN
100.0000 mg | Freq: Every day | INTRAMUSCULAR | Status: DC
  Administered 2015-04-17 – 2015-04-19 (×3): 100 mg via INTRAVENOUS
  Administered 2015-04-20: 200 mg via INTRAVENOUS
  Administered 2015-04-21 – 2015-04-23 (×3): 100 mg via INTRAVENOUS
  Filled 2015-04-16: qty 1
  Filled 2015-04-16 (×7): qty 2

## 2015-04-16 MED ORDER — LORAZEPAM 2 MG/ML IJ SOLN
1.0000 mg | Freq: Once | INTRAMUSCULAR | Status: AC
  Administered 2015-04-16: 1 mg via INTRAVENOUS

## 2015-04-16 MED ORDER — DEXTROSE-NACL 5-0.45 % IV SOLN
INTRAVENOUS | Status: DC
  Administered 2015-04-16 (×2): via INTRAVENOUS

## 2015-04-16 MED ORDER — LORAZEPAM 2 MG/ML IJ SOLN: 2.0000 mg | INTRAMUSCULAR | Status: DC | PRN

## 2015-04-16 MED ORDER — CETYLPYRIDINIUM CHLORIDE 0.05 % MT LIQD
7.0000 mL | Freq: Two times a day (BID) | OROMUCOSAL | Status: DC
  Administered 2015-04-16 – 2015-05-01 (×28): 7 mL via OROMUCOSAL

## 2015-04-16 MED ORDER — FOLIC ACID 5 MG/ML IJ SOLN
1.0000 mg | Freq: Every day | INTRAMUSCULAR | Status: DC
  Administered 2015-04-17 – 2015-04-23 (×6): 1 mg via INTRAVENOUS
  Filled 2015-04-16 (×9): qty 0.2

## 2015-04-16 MED ORDER — LACTULOSE 10 GM/15ML PO SOLN
30.0000 g | Freq: Three times a day (TID) | ORAL | Status: DC
  Administered 2015-04-16 – 2015-04-17 (×4): 30 g via ORAL
  Filled 2015-04-16 (×5): qty 45

## 2015-04-16 NOTE — Progress Notes (Signed)
[place] TEAM 1 - Stepdown/ICU TEAM Progress Note  Vanessa Hoffman ZOX:[id number] DOB: 02-23-1956 DOA: 04/15/2015 PCP: No primary care provider on file.  Admit HPI / Brief Narrative: Vanessa Hoffman is a 59 y.o.WF Fm Local nursing home PMHx Anxiety, Fibromyalgia, Depression, Alcohol Abuse, tobacco abuse, HTN, HLD, DM Type 2 without complication.  Transferred from a local nursing home, where she was discharged to following a skull fracture and subdural hematoma early last month. There is not much information available, but apparently the patient is typically able to speak and have a conversation. Today in the afternoon patient was found altered and not responding. To the nursing home knowledge, there hasn't been any history of trauma, fever, nausea, emesis, diarrhea or any other symptoms. Her parents state that her appetite and oral intake have been decreased for the past few days.  She is currently in no acute distress, but continues to have altered mental status.   HPI/Subjective: 8/23 A/O 0 patient is minimally responsive to painful stimuli.  Assessment/Plan: Altered mental status  -Continue q 4hr Neuro checks. -Obtain brain MRI; subacute/chronic SDH -Obtain EEG; pending -Consult neurology; can call Dr. Amada Jupiter with official(curbside consult today) consult after workup complete.  Post concussive syndrome? -Per her parents, she has a history of multiple falls with head injuries at home in [place], [place], but almost never went to the hospital after her husband would find her on the floor. However, her parents stated that she had a hospitalization in [facility] a few months ago.   Vascular dementia -Would not explain acute,, comatose condition   Alcohol abuse/hepatic encephalopathy -Ethanol level; negative ( was not obtained on admission ) -UDS; negative pending  -Will place on CIWA  Elevated ammonia -8/23 Ammonia= 76 -Place NG tube -Lactulose 30gm  TID  Elevated liver enzymes -Patient with history of alcohol abuse and elevated ammonia, will obtain abdominal ultrasound R/O liver cirrhosis -Acute hepatitis panel pending -HIV panel pending  Anion gap Metabolic Acidosis -Serum osmolality pending -Urine osmolality pending -Urine electrolytes pending -Will calculate Osmolal Gap  Frequent falls -Secondary to intoxication?  Hyperlipidemia -Resume pravastatin once the patient's mental status is improved.  Essential HTN  -Stable.   Anemia -Anemia panel pending  AKI (acute kidney injury) -Improving with hydration   Code Status: FULL Family Communication:  Her husband is her POA. He is currently at a skilled nursing facility in [place], [place] following neurosurgery for brain aneurysm. He is a scheduled to be discharged Wednesday.  Vanessa Hoffman Father [phone]     Scribmer,Marie Mother [phone]         Disposition Plan: CIR vs SNF    Consultants: Verbal consult with Dr. Amada Jupiter (neurology)     Procedure/Significant Events: CT head without contrast:Minimal decrease in size of a right sided mixed acuity SDH -Minimal right-to-left midline shift. - negative acute processes noted.- Cerebral atrophy and small vessel ischemic change. -Similar left occipital nondisplaced skull fracture. 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 - Mild midline shift of 2.3 mm to the left is unchanged. No new hemorrhage    Culture 8/22 urine pending 8/22 blood right arm/hand pending   Antibiotics: Ceftriaxone 8/22  DVT prophylaxis: SCD   Devices    LINES / TUBES:      Continuous Infusions: . dextrose 5 % and 0.45% NaCl Stopped (04/16/15 0602)  . dextrose 5 % and 0.45% NaCl 125 mL/hr at 04/16/15 1712    Objective: VITAL SIGNS: Temp: 97.8 F (36.6 C) (08/23  1919) Temp Source: Axillary (08/23 1919) BP: 130/60 mmHg (08/23 1919) Pulse Rate: 102  (08/23 1919) SPO2; FIO2:   Intake/Output Summary (Last 24 hours) at 04/16/15 2057 Last data filed at 04/16/15 1400  Gross per 24 hour  Intake      0 ml  Output      0 ml  Net      0 ml     Exam: General:  A/O 0 patient is minimally responsive to painful stimuli, No acute respiratory distress Eyes: Patient's eyes minimally responsive to bright lights, will not track objects including bright lights around the room (dolls eyes?). scleral hemorrhage ENT: Negative Runny nose, negative gingival bleeding, Neck:  Negative scars, masses, torticollis, lymphadenopathy, JVD Lungs: Clear to auscultation bilaterally without wheezes or crackles Cardiovascular: Tachycardic, Regular rhythm without murmur gallop or rub normal S1 and S2 Abdomen:negative abdominal pain, positive soft, bowel sounds, no rebound, no ascites, no appreciable mass Extremities: No significant cyanosis, clubbing, or edema bilateral lower extremities Psychiatric:  Unable to assess  Neurologic:  Patient withdraws minimally to pain (Will moan's slightly pull-away extremity being stimulated). Unable to assess further   Data Reviewed: Basic Metabolic Panel:  Recent Labs Lab 04/15/15 1843 04/16/15 0523  NA 144 143  K 4.0 3.5  CL 108 109  CO2 13* 17*  GLUCOSE 76 136*  BUN 25* 24*  CREATININE 2.05* 1.88*  CALCIUM 9.2 9.0   Liver Function Tests:  Recent Labs Lab 04/15/15 1843 04/16/15 0523  AST 242* 243*  ALT 86* 90*  ALKPHOS 103 102  BILITOT 2.5* 2.2*  PROT 7.0 7.1  ALBUMIN 2.7* 2.7*   No results for input(s): LIPASE, AMYLASE in the last 168 hours.  Recent Labs Lab 04/15/15 1843 04/16/15 1010  AMMONIA 110* 76*   CBC:  Recent Labs Lab 04/15/15 1843 04/16/15 0523  WBC 11.3* 10.7*  NEUTROABS 7.4 6.9  HGB 8.7* 8.8*  HCT 25.7* 26.3*  MCV 93.5 93.3  PLT 181 166   Cardiac Enzymes: No results for input(s): CKTOTAL, CKMB, CKMBINDEX, TROPONINI in the last 168 hours. BNP (last 3 results) No results  for input(s): BNP in the last 8760 hours.  ProBNP (last 3 results) No results for input(s): PROBNP in the last 8760 hours.  CBG:  Recent Labs Lab 04/15/15 2050 04/15/15 2140 04/16/15 0110 04/16/15 0519 04/16/15 1237  GLUCAP 57* 151* 126* 138* 164*    Recent Results (from the past 240 hour(s))  Urine culture     Status: None (Preliminary result)   Collection Time: 04/15/15  6:12 PM  Result Value Ref Range Status   Specimen Description URINE, RANDOM  Final   Special Requests NONE  Final   Culture TOO YOUNG TO READ  Final   Report Status PENDING  Incomplete  Blood culture (routine x 2)     Status: None (Preliminary result)   Collection Time: 04/15/15  8:31 PM  Result Value Ref Range Status   Specimen Description BLOOD RIGHT ARM  Final   Special Requests BOTTLES DRAWN AEROBIC AND ANAEROBIC 5CC  Final   Culture NO GROWTH < 24 HOURS  Final   Report Status PENDING  Incomplete  Blood culture (routine x 2)     Status: None (Preliminary result)   Collection Time: 04/15/15  8:37 PM  Result Value Ref Range Status   Specimen Description BLOOD RIGHT HAND  Final   Special Requests BOTTLES DRAWN AEROBIC ONLY 4CC  Final   Culture NO GROWTH < 24 HOURS  Final  Report Status PENDING  Incomplete  MRSA PCR Screening     Status: None   Collection Time: 04/16/15  5:49 AM  Result Value Ref Range Status   MRSA by PCR NEGATIVE NEGATIVE Final    Comment:        The GeneXpert MRSA Assay (FDA approved for NASAL specimens only), is one component of a comprehensive MRSA colonization surveillance program. It is not intended to diagnose MRSA infection nor to guide or monitor treatment for MRSA infections.      Studies:  Recent x-ray studies have been reviewed in detail by the Attending Physician  Scheduled Meds:  Scheduled Meds: . antiseptic oral rinse  7 mL Mouth Rinse BID  . cefTRIAXone (ROCEPHIN)  IV  1 g Intravenous Q24H  . famotidine (PEPCID) IV  20 mg Intravenous Q24H  . folic  acid  1 mg Intravenous Daily  . lactulose  30 g Oral TID  . thiamine  100 mg Intravenous Daily    Time spent on care of this patient: 40 mins   Sheala Dosh, Roselind Messier , MD  [facility] Office  [phone] Pager - [phone]  On-Call/Text Page:      [email or web address]      password [email or web address]  If 7PM-7AM, please contact night-coverage [email or web address] Password [facility] 04/16/2015, 8:57 PM   LOS: 1 day   Care during the described time interval was provided by me .  I have reviewed this patient's available data, including medical history, events of note, physical examination, and all test results as part of my evaluation. I have personally reviewed and interpreted all radiology studies.   Carolyne Littles, MD [phone] Pager

## 2015-04-16 NOTE — ED Notes (Signed)
Hospital Bed ordered .

## 2015-04-16 NOTE — Progress Notes (Signed)
Father at the bedside. He has questions about scans. Would like to be updated. Set up password Charlie. NO formal health care POA has been arranged. But he cares for her and signs her documents. [phone] Fayne Norrie. Rennis Chris is her birth mother that may call as well.

## 2015-04-16 NOTE — Progress Notes (Signed)
Pt admitted to 2C14, MRSA pcr sent down, CHG bath complete. No family present. Pt is noted to be arouseable to pain, not following commands and currently non verbal. Will monitor closely.

## 2015-04-16 NOTE — Progress Notes (Signed)
Attempted EEG several times since 1:00 PM; patient went to Ultrasound and then MRI, not back yet. EEG to be done in AM of 08/24

## 2015-04-17 ENCOUNTER — Inpatient Hospital Stay (HOSPITAL_COMMUNITY): Payer: Medicare Other

## 2015-04-17 DIAGNOSIS — R4182 Altered mental status, unspecified: Secondary | ICD-10-CM

## 2015-04-17 LAB — CBC WITH DIFFERENTIAL/PLATELET
BASOS ABS: 0.1 10*3/uL (ref 0.0–0.1)
BASOS PCT: 1 % (ref 0–1)
EOS PCT: 7 % — AB (ref 0–5)
Eosinophils Absolute: 0.7 10*3/uL (ref 0.0–0.7)
HCT: 27.2 % — ABNORMAL LOW (ref 36.0–46.0)
Hemoglobin: 9.3 g/dL — ABNORMAL LOW (ref 12.0–15.0)
Lymphocytes Relative: 19 % (ref 12–46)
Lymphs Abs: 2 10*3/uL (ref 0.7–4.0)
MCH: 31.7 pg (ref 26.0–34.0)
MCHC: 34.2 g/dL (ref 30.0–36.0)
MCV: 92.8 fL (ref 78.0–100.0)
MONO ABS: 1.4 10*3/uL — AB (ref 0.1–1.0)
Monocytes Relative: 13 % — ABNORMAL HIGH (ref 3–12)
Neutro Abs: 6.5 10*3/uL (ref 1.7–7.7)
Neutrophils Relative %: 60 % (ref 43–77)
PLATELETS: 131 10*3/uL — AB (ref 150–400)
RBC: 2.93 MIL/uL — ABNORMAL LOW (ref 3.87–5.11)
RDW: 15.6 % — AB (ref 11.5–15.5)
WBC: 10.7 10*3/uL — ABNORMAL HIGH (ref 4.0–10.5)

## 2015-04-17 LAB — COMPREHENSIVE METABOLIC PANEL
ALBUMIN: 2.5 g/dL — AB (ref 3.5–5.0)
ALT: 84 U/L — ABNORMAL HIGH (ref 14–54)
AST: 207 U/L — AB (ref 15–41)
Alkaline Phosphatase: 99 U/L (ref 38–126)
Anion gap: 12 (ref 5–15)
BUN: 19 mg/dL (ref 6–20)
CHLORIDE: 108 mmol/L (ref 101–111)
CO2: 22 mmol/L (ref 22–32)
Calcium: 8.4 mg/dL — ABNORMAL LOW (ref 8.9–10.3)
Creatinine, Ser: 1.26 mg/dL — ABNORMAL HIGH (ref 0.44–1.00)
GFR calc Af Amer: 54 mL/min — ABNORMAL LOW (ref >60)
GFR calc non Af Amer: 46 mL/min — ABNORMAL LOW (ref >60)
GLUCOSE: 152 mg/dL — AB (ref 65–99)
POTASSIUM: 3.1 mmol/L — AB (ref 3.5–5.1)
SODIUM: 142 mmol/L (ref 135–145)
Total Bilirubin: 1.4 mg/dL — ABNORMAL HIGH (ref 0.3–1.2)
Total Protein: 6.5 g/dL (ref 6.5–8.1)

## 2015-04-17 LAB — RAPID HIV SCREEN (HIV 1/2 AB+AG)
HIV 1/2 Antibodies: NONREACTIVE
HIV-1 P24 ANTIGEN - HIV24: NONREACTIVE

## 2015-04-17 LAB — AMMONIA: Ammonia: 88 umol/L — ABNORMAL HIGH (ref 9–35)

## 2015-04-17 MED ORDER — LACTULOSE 10 GM/15ML PO SOLN
30.0000 g | Freq: Four times a day (QID) | ORAL | Status: DC
  Administered 2015-04-17 – 2015-04-26 (×31): 30 g
  Filled 2015-04-17 (×35): qty 45

## 2015-04-17 MED ORDER — POTASSIUM CHLORIDE 10 MEQ/100ML IV SOLN
10.0000 meq | INTRAVENOUS | Status: AC
  Administered 2015-04-17 – 2015-04-18 (×5): 10 meq via INTRAVENOUS
  Filled 2015-04-17 (×5): qty 100

## 2015-04-17 NOTE — Progress Notes (Signed)
[place] TEAM 1 - Stepdown/ICU TEAM Progress Note  Vanessa Hoffman ZOX:[id number] DOB: May 02, 1956 DOA: 04/15/2015 PCP: No primary care provider on file.  Admit HPI / Brief Narrative: 59 y.o.F SNF resident w/ a Hx Anxiety, Fibromyalgia, Depression, Alcohol Abuse, tobacco abuse, HTN, HLD, DM2, and a recent skkull fx w/ subdural hematoma who presented to the ED w/ altered mental status.  The patient is typically able to speak and have a conversation. The patient was reportedly found in an altered state, not responding. To the nursing home knowledge, there hadn't been any history of trauma, fever, nausea, emesis, diarrhea or any other symptoms. Her family stated her appetite and oral intake had been decreased for a few days.  HPI/Subjective: The patient remains obtunded.  She does not respond to questioning or tactile stimulation.  She does not appear to be in respiratory distress or significant pain.  I spoke with the patient's mother, father, and husband at bedside at length.  Assessment/Plan:  Multifactorial Altered mental status  I believe this is due to hepatic encephalopathy + UTI + acute renal failure - we will continue to search for other reasons but these alone could explain her present symptoms  Alcohol abuse / hepatic encephalopathy - fatty change v/s cirrhosis of the liver  Continue lactulose - follow ammonia - once pt more stable will need to assess synthetic function of the liver - rule out acute viral hepatitis   UTI / Pyelonephritis  Empiric abx - f/u culture   Anion gap Metabolic Acidosis Resolved w/ volume expansion   Frequent falls - 13 mm subacute and chronic right-sided subdural hematoma secondary to intoxication? - ?EtOH related cerebellar dysfxn / neuropathy - PT/OT evals once improved   Vascular dementia  Hyperlipidemia Resume pravastatin once the patient's mental status is improved.  Essential HTN  BP controlled  Anemia Anemia panel suggestive of anemia  of chronic disease or malnutrition   AKI  Steadily improving with hydration  Hypokalemia  Replace and follow trend   Code Status: FULL  Family Communication:  Her husband is her POA.   Fayne Norrie Father [phone]     Scribmer,Marie Mother [phone]         Disposition Plan: SDU  Consultants: Neurology  Procedures: CT head without contrast:Minimal decrease in size of a right sided mixed acuity SDH -Minimal right-to-left midline shift. - negative acute processes noted.- Cerebral atrophy and small vessel ischemic change. -Similar left occipital nondisplaced skull fracture. 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 - Mild midline shift of 2.3 mm to the left is unchanged. No new hemorrhage  Antibiotics: Ceftriaxone 8/22 >  DVT prophylaxis: SCD  Objective: Blood pressure 144/68, pulse 105, temperature 99.1 F (37.3 C), temperature source Oral, resp. rate 18, height 5\' 4"  (1.626 m), weight 78 kg (171 lb 15.3 oz), SpO2 100 %.  Intake/Output Summary (Last 24 hours) at 04/17/15 1447 Last data filed at 04/17/15 1300  Gross per 24 hour  Intake   1900 ml  Output      0 ml  Net   1900 ml   Exam: General: No acute respiratory distress - obtunded  Lungs: Clear to auscultation bilaterally without wheezes or crackles Cardiovascular: Regular rate and rhythm without murmur gallop or rub normal S1 and S2 Abdomen: Nontender, nondistended, soft, bowel sounds positive, no rebound, no ascites, no appreciable mass Extremities: No significant cyanosis, or clubbing;  2+ edema bilateral lower extremities  Data Reviewed: Basic Metabolic Panel:  Recent Labs  Lab 04/15/15 1843 04/16/15 0523 04/17/15 0232  NA 144 143 142  K 4.0 3.5 3.1*  CL 108 109 108  CO2 13* 17* 22  GLUCOSE 76 136* 152*  BUN 25* 24* 19  CREATININE 2.05* 1.88* 1.26*  CALCIUM 9.2 9.0 8.4*   Liver Function Tests:  Recent Labs Lab  04/15/15 1843 04/16/15 0523 04/17/15 0232  AST 242* 243* 207*  ALT 86* 90* 84*  ALKPHOS 103 102 99  BILITOT 2.5* 2.2* 1.4*  PROT 7.0 7.1 6.5  ALBUMIN 2.7* 2.7* 2.5*    Recent Labs Lab 04/15/15 1843 04/16/15 1010 04/17/15 0232  AMMONIA 110* 76* 88*   CBC:  Recent Labs Lab 04/15/15 1843 04/16/15 0523 04/17/15 0232  WBC 11.3* 10.7* 10.7*  NEUTROABS 7.4 6.9 6.5  HGB 8.7* 8.8* 9.3*  HCT 25.7* 26.3* 27.2*  MCV 93.5 93.3 92.8  PLT 181 166 131*   CBG:  Recent Labs Lab 04/15/15 2050 04/15/15 2140 04/16/15 0110 04/16/15 0519 04/16/15 1237  GLUCAP 57* 151* 126* 138* 164*    Recent Results (from the past 240 hour(s))  Urine culture     Status: None (Preliminary result)   Collection Time: 04/15/15  6:12 PM  Result Value Ref Range Status   Specimen Description URINE, RANDOM  Final   Special Requests NONE  Final   Culture CULTURE REINCUBATED FOR BETTER GROWTH  Final   Report Status PENDING  Incomplete  Blood culture (routine x 2)     Status: None (Preliminary result)   Collection Time: 04/15/15  8:31 PM  Result Value Ref Range Status   Specimen Description BLOOD RIGHT ARM  Final   Special Requests BOTTLES DRAWN AEROBIC AND ANAEROBIC 5CC  Final   Culture NO GROWTH < 24 HOURS  Final   Report Status PENDING  Incomplete  Blood culture (routine x 2)     Status: None (Preliminary result)   Collection Time: 04/15/15  8:37 PM  Result Value Ref Range Status   Specimen Description BLOOD RIGHT HAND  Final   Special Requests BOTTLES DRAWN AEROBIC ONLY 4CC  Final   Culture NO GROWTH < 24 HOURS  Final   Report Status PENDING  Incomplete  MRSA PCR Screening     Status: None   Collection Time: 04/16/15  5:49 AM  Result Value Ref Range Status   MRSA by PCR NEGATIVE NEGATIVE Final    Comment:        The GeneXpert MRSA Assay (FDA approved for NASAL specimens only), is one component of a comprehensive MRSA colonization surveillance program. It is not intended to diagnose  MRSA infection nor to guide or monitor treatment for MRSA infections.      Studies:  Recent x-ray studies have been reviewed in detail by the Attending Physician  Scheduled Meds:  Scheduled Meds: . antiseptic oral rinse  7 mL Mouth Rinse BID  . cefTRIAXone (ROCEPHIN)  IV  1 g Intravenous Q24H  . famotidine (PEPCID) IV  20 mg Intravenous Q24H  . folic acid  1 mg Intravenous Daily  . lactulose  30 g Oral TID  . thiamine  100 mg Intravenous Daily    Time spent on care of this patient: 35 mins  Lonia Blood, MD [facility] For Consults/Admissions - Flow Manager - [phone] Office  [phone]  Contact MD directly via text page:      [email or web address]      password [facility]  04/17/2015, 2:47 PM   LOS: 2 days

## 2015-04-17 NOTE — Clinical Social Work Note (Signed)
Clinical Social Work Assessment  Patient Details  Name: Vanessa Hoffman MRN: [id number] Date of Birth: 04/10/1956  Date of referral:  04/17/15               Reason for consult:  Facility Placement                Permission sought to share information with:  Family Supports, Oceanographer granted to share information::  No (pt DO)  Name::     Thayer Ohm  Agency::  [facility]  Relationship::  spouse  Contact Information:     Housing/Transportation Living arrangements for the past 2 months:  Skilled Building surveyor of Information:  Spouse, Parent Patient Interpreter Needed:  None Criminal Activity/Legal Involvement Pertinent to Current Situation/Hospitalization:  No - Comment as needed Significant Relationships:  Spouse, Parents Lives with:  Spouse Do you feel safe going back to the place where you live?  No Need for family participation in patient care:  Yes (Comment)  Care giving concerns:  Pt does not have a home at this time-pt and husband were in a hotel prior to admission while looking for permanent housing- pt husband was just released from SNF and unable to provide current level of care needed- pt has great support from parents/spouse    Office manager / plan:  CSW spoke with pt family about return to [facility] at time of DC  Employment status:  Retired Health and safety inspector:  Medicare (reported that they also have Medicaid- no record in hospital system) PT Recommendations:  Not assessed at this time Information / Referral to community resources:  Skilled Nursing Facility  Patient/Family's Response to care:  Pt family are not sure if they want pt to return to [facility]- they expressed some concerns about care: pt could not feed herself and was not being fed by staff, some hygeiene concerns, and lack of commication with the staff.  Report that the facility is convenient for them but not sure they will return given  current concerns.    Patient/Family's Understanding of and Emotional Response to Diagnosis, Current Treatment, and Prognosis:  Pt family had no questions or concerns at this time  Emotional Assessment Appearance:  Appears older than stated age Attitude/Demeanor/Rapport:  Unable to Assess Affect (typically observed):  Unable to Assess Orientation:   (DO x4) Alcohol / Substance use:  Not Applicable Psych involvement (Current and /or in the community):  No (Comment)  Discharge Needs  Concerns to be addressed:  Care Coordination Readmission within the last 30 days:  No Current discharge risk:  Physical Impairment Barriers to Discharge:  Continued Medical Work up   [employer], LCSW 04/17/2015, 3:20 PM

## 2015-04-17 NOTE — Procedures (Signed)
ELECTROENCEPHALOGRAM REPORT  Patient: Vanessa Hoffman       Room #: 2C14 EEG No. ID: [id number] Age: 59 y.o.        Sex: female Referring Physician: Maretta Bees Report Date:  04/17/2015        Interpreting Physician: Aline Brochure  History: Vanessa Hoffman is an 59 y.o. female with a history of head trauma with skull fracture and subdural hematoma and transferred from nursing home with altered mental status with decreased responsiveness.  Indications for study:  Assess severity of encephalopathy; rule out seizure activity.  Technique: This is an 18 channel routine scalp EEG performed at the bedside with bipolar and monopolar montages arranged in accordance to the international 10/20 system of electrode placement.   Description: Patient was noted to be confused and unable to relax to command during this study. Predominant activity consisted of low amplitude diffuse delta and theta activity with superimposed diffuse muscle artifact, obscuring potentially recordable brain activity otherwise.  Interpretation: This EEG is difficult to interpret because of diffuse muscle artifact which is essentially continuous. Moderate slowing of background activity was noted. Electrographic seizure activity cannot be ruled out. Repeat study with the patient in a more relaxed state is recommended.   Venetia Maxon M.D. [facility] [phone]

## 2015-04-17 NOTE — Progress Notes (Addendum)
Initial Nutrition Assessment  DOCUMENTATION CODES:   Obesity unspecified  INTERVENTION:   Advance diet as medically appropriate, add interventions accordingly  NUTRITION DIAGNOSIS:   Inadequate oral intake related to inability to eat as evidenced by NPO status  GOAL:   Patient will meet greater than or equal to 90% of their needs  MONITOR:   Diet advancement, PO intake, Labs, Weight trends, I & O's  REASON FOR ASSESSMENT:   Low Braden  ASSESSMENT:   59 y.o. Female transferred from nursing home, where she was discharged following a skull fracture/subdural hematoma early last month. There is not much information available, but apparently the patient is typically able to speak and have a conversation. Today in the afternoon patient was found altered and not responding. To the nursing home knowledge, there hasn't been any history of trauma, fever, nausea, emesis, diarrhea or any other symptoms. Her parents state that her appetite and oral intake have been decreased for the past few days.  RD unable to obtain nutrition hx.  Pt minimally responsive.  NGT in place.  NPO since admission.  Low braden score places patient at risk for skin breakdown.  Question whether pt will need short term nutrition support.  RD unable to complete Nutrition Focused Physical Exam at this time.  Diet Order:  NPO  Skin:  Reviewed, no issues  Last BM:  N/A  Height:   Ht Readings from Last 1 Encounters:  04/16/15  (1.626 m)    Weight:   Wt Readings from Last 1 Encounters:  04/16/15 171 lb 15.3 oz (78 kg)    Ideal Body Weight:  54.5 kg  BMI:  Body mass index is 29.5 kg/(m^2).  Estimated Nutritional Needs:   Kcal:  1800-2000  Protein:  90-100 gm  Fluid:  1.8-2.0 L  EDUCATION NEEDS:   No education needs identified at this time  Maureen Chatters, RD, LDN Pager #: [phone] After-Hours Pager #: [phone]

## 2015-04-17 NOTE — Progress Notes (Signed)
EEG Completed; Results Pending  

## 2015-04-18 DIAGNOSIS — E876 Hypokalemia: Secondary | ICD-10-CM

## 2015-04-18 DIAGNOSIS — E785 Hyperlipidemia, unspecified: Secondary | ICD-10-CM | POA: Diagnosis present

## 2015-04-18 LAB — COMPREHENSIVE METABOLIC PANEL
ALK PHOS: 103 U/L (ref 38–126)
ALT: 85 U/L — AB (ref 14–54)
ANION GAP: 10 (ref 5–15)
AST: 194 U/L — ABNORMAL HIGH (ref 15–41)
Albumin: 2.3 g/dL — ABNORMAL LOW (ref 3.5–5.0)
BUN: 15 mg/dL (ref 6–20)
CALCIUM: 8.1 mg/dL — AB (ref 8.9–10.3)
CHLORIDE: 111 mmol/L (ref 101–111)
CO2: 22 mmol/L (ref 22–32)
CREATININE: 1.07 mg/dL — AB (ref 0.44–1.00)
GFR, EST NON AFRICAN AMERICAN: 57 mL/min — AB (ref >60)
Glucose, Bld: 178 mg/dL — ABNORMAL HIGH (ref 65–99)
Potassium: 3.1 mmol/L — ABNORMAL LOW (ref 3.5–5.1)
Sodium: 143 mmol/L (ref 135–145)
Total Bilirubin: 1.1 mg/dL (ref 0.3–1.2)
Total Protein: 6.5 g/dL (ref 6.5–8.1)

## 2015-04-18 LAB — CBC WITH DIFFERENTIAL/PLATELET
Basophils Absolute: 0.1 10*3/uL (ref 0.0–0.1)
Basophils Relative: 1 % (ref 0–1)
EOS ABS: 0.2 10*3/uL (ref 0.0–0.7)
EOS PCT: 1 % (ref 0–5)
HCT: 27 % — ABNORMAL LOW (ref 36.0–46.0)
Hemoglobin: 9.1 g/dL — ABNORMAL LOW (ref 12.0–15.0)
LYMPHS ABS: 1.8 10*3/uL (ref 0.7–4.0)
LYMPHS PCT: 12 % (ref 12–46)
MCH: 31.1 pg (ref 26.0–34.0)
MCHC: 33.7 g/dL (ref 30.0–36.0)
MCV: 92.2 fL (ref 78.0–100.0)
MONO ABS: 1.5 10*3/uL — AB (ref 0.1–1.0)
MONOS PCT: 10 % (ref 3–12)
Neutro Abs: 11.2 10*3/uL — ABNORMAL HIGH (ref 1.7–7.7)
Neutrophils Relative %: 76 % (ref 43–77)
PLATELETS: 140 10*3/uL — AB (ref 150–400)
RBC: 2.93 MIL/uL — AB (ref 3.87–5.11)
RDW: 15.7 % — ABNORMAL HIGH (ref 11.5–15.5)
WBC: 14.7 10*3/uL — AB (ref 4.0–10.5)

## 2015-04-18 LAB — MAGNESIUM: MAGNESIUM: 1.4 mg/dL — AB (ref 1.7–2.4)

## 2015-04-18 LAB — HEPATITIS PANEL, ACUTE
HCV Ab: <0.1 s/co ratio (ref 0.0–0.9)
HEP A IGM: NEGATIVE
HEP B S AG: NEGATIVE
Hep B C IgM: NEGATIVE

## 2015-04-18 LAB — PHOSPHORUS: PHOSPHORUS: 1.3 mg/dL — AB (ref 2.5–4.6)

## 2015-04-18 LAB — GLUCOSE, CAPILLARY
Glucose-Capillary: 115 mg/dL — ABNORMAL HIGH (ref 65–99)
Glucose-Capillary: 143 mg/dL — ABNORMAL HIGH (ref 65–99)

## 2015-04-18 LAB — AMMONIA: AMMONIA: 78 umol/L — AB (ref 9–35)

## 2015-04-18 MED ORDER — RIFAXIMIN 550 MG PO TABS
550.0000 mg | ORAL_TABLET | Freq: Two times a day (BID) | ORAL | Status: DC
  Administered 2015-04-18 – 2015-05-01 (×26): 550 mg
  Filled 2015-04-18 (×26): qty 1

## 2015-04-18 MED ORDER — POTASSIUM CHLORIDE 10 MEQ/100ML IV SOLN
10.0000 meq | INTRAVENOUS | Status: AC
  Administered 2015-04-18 (×4): 10 meq via INTRAVENOUS
  Filled 2015-04-18 (×4): qty 100

## 2015-04-18 MED ORDER — MAGNESIUM SULFATE 50 % IJ SOLN
3.0000 g | Freq: Once | INTRAMUSCULAR | Status: AC
  Administered 2015-04-18: 3 g via INTRAVENOUS
  Filled 2015-04-18: qty 6

## 2015-04-18 NOTE — Progress Notes (Signed)
[facility] TEAM 1 - Stepdown/ICU TEAM Progress Note  Vanessa Hoffman KGM:[id number] DOB: [birth year] DOA: 04/15/2015 PCP: No primary care provider on file.  Admit HPI / Brief Narrative: Vanessa Hoffman is a 59 y.o.WF Fm Local nursing home PMHx Anxiety, Fibromyalgia, Depression, Alcohol Abuse, tobacco abuse, HTN, HLD, DM Type 2 without complication.  Transferred from a local nursing home, where she was discharged to following a skull fracture and subdural hematoma early last month. There is not much information available, but apparently the patient is typically able to speak and have a conversation. Today in the afternoon patient was found altered and not responding. To the nursing home knowledge, there hasn't been any history of trauma, fever, nausea, emesis, diarrhea or any other symptoms. Her parents state that her appetite and oral intake have been decreased for the past few days.  She is currently in no acute distress, but continues to have altered mental status.   HPI/Subjective: 8/25 A/O 0 patient is minimally responsive to painful stimuli; will open her eyes today to sternal rub which is slightly better than on 8/23.  Assessment/Plan: Altered mental status  -Continue q 4hr Neuro checks. -Brain MRI; subacute/chronic SDH -EEG; ambiguous readings see results below -Officially Consult neurology in A.m.; how to relax patient without scueing EEG results? .  Post concussive syndrome? -Per her parents, she has a history of multiple falls with head injuries at home in [place], [place], but almost never went to the hospital after her husband would find her on the floor. However, her parents stated that she had a hospitalization in [facility] a few months ago.   Vascular dementia -Would not explain acute,comatose condition   Alcohol abuse/hepatic encephalopathy -Ethanol level; negative ( was not obtained on admission ) -UDS; negative  -Will place on CIWA  Elevated  ammonia -8/25 Ammonia= 78 -Place NG tube -Increase Lactulose 30gm QID -Start rifaximin 550 mg BID  Elevated liver enzymes -Patient with history of alcohol abuse and elevated ammonia, will obtain abdominal ultrasound R/O liver cirrhosis -Acute hepatitis panel negative  -HIV panel negative  Anion gap Metabolic Acidosis -Resolved w/ volume expansion   Hypokalemia -Potassium IV 10 mEq 4  Hypomagnesemia -Magnesium IV 3 gm 1  Frequent falls -Secondary to intoxication?  Hyperlipidemia -Resume pravastatin once the patient's mental status is improved.  Essential HTN  -Stable.   Anemia -Anemia panel suggestive of anemia of chronic disease or malnutrition   AKI (acute kidney injury) -Improving with hydration   Code Status: FULL Family Communication:  Her husband is her POA. He is currently at a skilled nursing facility in [place], [place] following neurosurgery for brain aneurysm. He is a scheduled to be discharged Wednesday.  Fayne Norrie Father [phone]     Scribmer,Marie Mother [phone]         Disposition Plan: CIR vs SNF    Consultants: Verbal consult with Dr. Amada Jupiter (neurology)     Procedure/Significant Events: CT head without contrast:Minimal decrease in size of a right sided mixed acuity SDH -Minimal right-to-left midline shift. - negative acute processes noted.- Cerebral atrophy and small vessel ischemic change. -Similar left occipital nondisplaced skull fracture. 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 - Mild midline shift of 2.3 mm to the left is unchanged. No new hemorrhage 8/24 EEG;Electrographic seizure activity cannot be ruled out. Repeat study with the patient in a more relaxed state is recommended   Culture 8/22 urine pending 8/22 blood right arm/hand pending   Antibiotics: Ceftriaxone 8/22  DVT prophylaxis: SCD   Devices    LINES / TUBES:       Continuous Infusions: . dextrose 5 % and 0.45% NaCl 75 mL/hr at 04/17/15 2134    Objective: VITAL SIGNS: Temp: 97.5 F (36.4 C) (08/25 1606) Temp Source: Oral (08/25 1606) BP: 94/72 mmHg (08/25 1941) Pulse Rate: 98 (08/25 1941) SPO2; FIO2:   Intake/Output Summary (Last 24 hours) at 04/18/15 2002 Last data filed at 04/18/15 1700  Gross per 24 hour  Intake    750 ml  Output      0 ml  Net    750 ml     Exam: General:  A/O 0 patient is minimally responsive to painful stimuli, although opens eyes with less stimuli didn't previously required, No acute respiratory distress Eyes: Pupils equal round reactive to light and accommodation, will not track objects including bright lights around the room. scleral hemorrhage ENT: Negative Runny nose, negative gingival bleeding, Neck:  Negative scars, masses, torticollis, lymphadenopathy, JVD Lungs: Clear to auscultation bilaterally without wheezes or crackles Cardiovascular: Tachycardic, Regular rhythm without murmur gallop or rub normal S1 and S2 Abdomen:negative abdominal pain, positive soft, bowel sounds, no rebound, no ascites, no appreciable mass Extremities: No significant cyanosis, clubbing, or edema bilateral lower extremities Psychiatric:  Unable to assess  Neurologic:  Patient withdraws minimally to pain (Will moan's slightly pull-away extremity being stimulated). Unable to assess further   Data Reviewed: Basic Metabolic Panel:  Recent Labs Lab 04/15/15 1843 04/16/15 0523 04/17/15 0232 04/18/15 0256  NA 144 143 142 143  K 4.0 3.5 3.1* 3.1*  CL 108 109 108 111  CO2 13* 17* 22 22  GLUCOSE 76 136* 152* 178*  BUN 25* 24* 19 15  CREATININE 2.05* 1.88* 1.26* 1.07*  CALCIUM 9.2 9.0 8.4* 8.1*  MG  --   --   --  1.4*  PHOS  --   --   --  1.3*   Liver Function Tests:  Recent Labs Lab 04/15/15 1843 04/16/15 0523 04/17/15 0232 04/18/15 0256  AST 242* 243* 207* 194*  ALT 86* 90* 84* 85*  ALKPHOS 103 102 99  103  BILITOT 2.5* 2.2* 1.4* 1.1  PROT 7.0 7.1 6.5 6.5  ALBUMIN 2.7* 2.7* 2.5* 2.3*   No results for input(s): LIPASE, AMYLASE in the last 168 hours.  Recent Labs Lab 04/15/15 1843 04/16/15 1010 04/17/15 0232 04/18/15 0256  AMMONIA 110* 76* 88* 78*   CBC:  Recent Labs Lab 04/15/15 1843 04/16/15 0523 04/17/15 0232 04/18/15 0256  WBC 11.3* 10.7* 10.7* 14.7*  NEUTROABS 7.4 6.9 6.5 11.2*  HGB 8.7* 8.8* 9.3* 9.1*  HCT 25.7* 26.3* 27.2* 27.0*  MCV 93.5 93.3 92.8 92.2  PLT 181 166 131* 140*   Cardiac Enzymes: No results for input(s): CKTOTAL, CKMB, CKMBINDEX, TROPONINI in the last 168 hours. BNP (last 3 results) No results for input(s): BNP in the last 8760 hours.  ProBNP (last 3 results) No results for input(s): PROBNP in the last 8760 hours.  CBG:  Recent Labs Lab 04/16/15 0110 04/16/15 0519 04/16/15 1237 04/16/15 1657 04/17/15 0834  GLUCAP 126* 138* 164* 115* 143*    Recent Results (from the past 240 hour(s))  Urine culture     Status: None (Preliminary result)   Collection Time: 04/15/15  6:12 PM  Result Value Ref Range Status   Specimen Description URINE, RANDOM  Final   Special Requests NONE  Final   Culture >=100,000 COLONIES/mL GRAM NEGATIVE RODS  Final   Report  Status PENDING  Incomplete  Blood culture (routine x 2)     Status: None (Preliminary result)   Collection Time: 04/15/15  8:31 PM  Result Value Ref Range Status   Specimen Description BLOOD RIGHT ARM  Final   Special Requests BOTTLES DRAWN AEROBIC AND ANAEROBIC 5CC  Final   Culture NO GROWTH 3 DAYS  Final   Report Status PENDING  Incomplete  Blood culture (routine x 2)     Status: None (Preliminary result)   Collection Time: 04/15/15  8:37 PM  Result Value Ref Range Status   Specimen Description BLOOD RIGHT HAND  Final   Special Requests BOTTLES DRAWN AEROBIC ONLY 4CC  Final   Culture NO GROWTH 3 DAYS  Final   Report Status PENDING  Incomplete  MRSA PCR Screening     Status: None    Collection Time: 04/16/15  5:49 AM  Result Value Ref Range Status   MRSA by PCR NEGATIVE NEGATIVE Final    Comment:        The GeneXpert MRSA Assay (FDA approved for NASAL specimens only), is one component of a comprehensive MRSA colonization surveillance program. It is not intended to diagnose MRSA infection nor to guide or monitor treatment for MRSA infections.      Studies:  Recent x-ray studies have been reviewed in detail by the Attending Physician  Scheduled Meds:  Scheduled Meds: . antiseptic oral rinse  7 mL Mouth Rinse BID  . cefTRIAXone (ROCEPHIN)  IV  1 g Intravenous Q24H  . famotidine (PEPCID) IV  20 mg Intravenous Q24H  . folic acid  1 mg Intravenous Daily  . lactulose  30 g Per Tube QID  . rifaximin  550 mg Per Tube BID  . thiamine  100 mg Intravenous Daily    Time spent on care of this patient: 40 mins   Lyrika Souders, Roselind Messier , MD  [facility] Office  [phone] Pager - [phone]  On-Call/Text Page:      [email or web address]      password [email or web address]  If 7PM-7AM, please contact night-coverage [email or web address] Password [email or web address] 04/18/2015, 8:02 PM   LOS: 3 days   Care during the described time interval was provided by me .  I have reviewed this patient's available data, including medical history, events of note, physical examination, and all test results as part of my evaluation. I have personally reviewed and interpreted all radiology studies.   Carolyne Littles, MD [phone] Pager

## 2015-04-18 NOTE — Progress Notes (Signed)
CSW spoke with pt spouse- he is now agreeable to pt return to [facility] at time of DC.    CSW will continue to follow.  Merlyn Lot, LCSWA Clinical Social Worker [phone]

## 2015-04-19 DIAGNOSIS — I10 Essential (primary) hypertension: Secondary | ICD-10-CM

## 2015-04-19 DIAGNOSIS — N390 Urinary tract infection, site not specified: Secondary | ICD-10-CM

## 2015-04-19 DIAGNOSIS — R410 Disorientation, unspecified: Secondary | ICD-10-CM

## 2015-04-19 DIAGNOSIS — G9340 Encephalopathy, unspecified: Secondary | ICD-10-CM

## 2015-04-19 DIAGNOSIS — K744 Secondary biliary cirrhosis: Secondary | ICD-10-CM

## 2015-04-19 DIAGNOSIS — K7460 Unspecified cirrhosis of liver: Secondary | ICD-10-CM | POA: Diagnosis present

## 2015-04-19 LAB — URINE CULTURE: Culture: >=100000

## 2015-04-19 LAB — AMMONIA: AMMONIA: 64 umol/L — AB (ref 9–35)

## 2015-04-19 NOTE — Progress Notes (Signed)
[place] TEAM 1 - Stepdown/ICU TEAM Progress Note  OSIRIS CHARLES ZOX:[id number] DOB: 07/21/1956 DOA: 04/15/2015 PCP: No primary care provider on file.  Admit HPI / Brief Narrative: JAMEELAH WATTS is a 59 y.o.WF Fm Local nursing home PMHx Anxiety, Fibromyalgia, Depression, Alcohol Abuse, tobacco abuse, HTN, HLD, DM Type 2 without complication.  Transferred from a local nursing home, where she was discharged to following a skull fracture and subdural hematoma early last month. There is not much information available, but apparently the patient is typically able to speak and have a conversation. Today in the afternoon patient was found altered and not responding. To the nursing home knowledge, there hasn't been any history of trauma, fever, nausea, emesis, diarrhea or any other symptoms. Her parents state that her appetite and oral intake have been decreased for the past few days.  She is currently in no acute distress, but continues to have altered mental status.   HPI/Subjective: 8/26 A/O 1 patient cannot verbalize but responds to her name, follows some commands i.e. will move her R UE/RLE to command. Eyes open spontaneously. It seems to respond to mother's voice. .  Assessment/Plan: Altered mental status  -Continue q 4hr Neuro checks. -Brain MRI; subacute/chronic SDH -EEG; ambiguous readings see results below -Officially Consult neurology in A.m.; how to relax patient without scueing EEG results? .  Post concussive syndrome? -Per her parents, she has a history of multiple falls with head injuries at home in [place], [place], but almost never went to the hospital after her husband would find her on the floor. However, her parents stated that she had a hospitalization in [facility] a few months ago.   Vascular dementia -Would not explain acute,comatose condition   Alcohol abuse/hepatic encephalopathy -Ethanol level; negative ( was not obtained on admission ); mother  verifies patient drinks 1/2 of a fifth.Vodka/ day minimum -UDS; negative (mother confirms uses multiple drugs) -Will place on CIWA  Elevated ammonia -8/26 Ammonia= 64 -Place NG tube -Continue Lactulose 30gm QID -Continue rifaximin 550 mg BID  Elevated liver enzymes -Patient with history of alcohol abuse and elevated ammonia, will obtain abdominal ultrasound R/O liver cirrhosis -Acute hepatitis panel negative  -HIV panel negative -Abdominal ultrasound; moderate ascites, fatty infiltration of liver see results below  Anion gap Metabolic Acidosis -Resolved w/ volume expansion   Hypokalemia -Potassium IV 10 mEq 4  Hypomagnesemia -Magnesium IV 3 gm 1  Frequent falls -Secondary to intoxication?  Hyperlipidemia -Resume pravastatin once the patient's mental status is improved.  Essential HTN  -Stable.   Anemia -Anemia panel suggestive of anemia of chronic disease or malnutrition   AKI (acute kidney injury) -Improving with hydration  UTI Klebsiella pneumonia -Complete 5 day course antibiotics    Code Status: FULL Family Communication: Spoke with mother who understands patient cognition may not fully recovered. Her husband is her POA. He is currently at a skilled nursing facility in [place], [place] following neurosurgery for brain aneurysm. He is a scheduled to be discharged Wednesday.  Fayne Norrie Father [phone]     Scribmer,Marie Mother [phone]         Disposition Plan: CIR vs SNF    Consultants: Verbal consult with Dr. Amada Jupiter (neurology)     Procedure/Significant Events: CT head without contrast:Minimal decrease in size of a right sided mixed acuity SDH -Minimal right-to-left midline shift. - negative acute processes noted.- Cerebral atrophy and small vessel ischemic change. -Similar left occipital nondisplaced skull fracture. 8/23 MRI brain without contrast;13 mm subacute and chronic right-sided  subdural hematoma. - Mild  midline shift of 2.3 mm to the left is unchanged. No new hemorrhage 8/24 EEG;Electrographic seizure activity cannot be ruled out. Repeat study with the patient in a more relaxed state is recommended 8/23 abdominal ultrasound;- Increased hepatic echotexture C/W fatty infiltration.  -cysts in the right lobe-  Sludge versus tiny non shadowing stones in the gallbladder. - No sonographic evidence of acute cholecystitis. -Moderate ascites.   Culture 8/22 urine positive Klebsiella pneumonia 8/22 blood right arm/hand NGTD   Antibiotics: Ceftriaxone 8/22>>  DVT prophylaxis: SCD   Devices    LINES / TUBES:      Continuous Infusions: . dextrose 5 % and 0.45% NaCl 75 mL/hr at 04/19/15 4098    Objective: VITAL SIGNS: Temp: 98.4 F (36.9 C) (08/26 2026) Temp Source: Axillary (08/26 2026) BP: 93/42 mmHg (08/26 2026) Pulse Rate: 102 (08/26 2026) SPO2; FIO2:   Intake/Output Summary (Last 24 hours) at 04/19/15 2106 Last data filed at 04/19/15 1900  Gross per 24 hour  Intake   2000 ml  Output    800 ml  Net   1200 ml     Exam: General:  A/O 1 patient cannot verbalize but responds to her name, follows some commands i.e. will move her R UE/RLE to command. Eyes open spontaneously. It seems to respond to mother's voice. No acute respiratory distress Eyes: Pupils equal round reactive to light and accommodation, will not track objects including bright lights around the room. scleral hemorrhage ENT: Negative Runny nose, negative gingival bleeding, Neck:  Negative scars, masses, torticollis, lymphadenopathy, JVD Lungs: Clear to auscultation bilaterally without wheezes or crackles Cardiovascular: Regular rhythm and rate without murmur gallop or rub normal S1 and S2 Abdomen:negative abdominal pain, positive soft, bowel sounds, no rebound, no ascites, no appreciable mass Extremities: No significant cyanosis, clubbing, or edema bilateral lower extremities Psychiatric:  Unable to assess   Neurologic:  Follow some commands will move R UE/RLE to command withdraws to pain       Data Reviewed: Basic Metabolic Panel:  Recent Labs Lab 04/15/15 1843 04/16/15 0523 04/17/15 0232 04/18/15 0256  NA 144 143 142 143  K 4.0 3.5 3.1* 3.1*  CL 108 109 108 111  CO2 13* 17* 22 22  GLUCOSE 76 136* 152* 178*  BUN 25* 24* 19 15  CREATININE 2.05* 1.88* 1.26* 1.07*  CALCIUM 9.2 9.0 8.4* 8.1*  MG  --   --   --  1.4*  PHOS  --   --   --  1.3*   Liver Function Tests:  Recent Labs Lab 04/15/15 1843 04/16/15 0523 04/17/15 0232 04/18/15 0256  AST 242* 243* 207* 194*  ALT 86* 90* 84* 85*  ALKPHOS 103 102 99 103  BILITOT 2.5* 2.2* 1.4* 1.1  PROT 7.0 7.1 6.5 6.5  ALBUMIN 2.7* 2.7* 2.5* 2.3*   No results for input(s): LIPASE, AMYLASE in the last 168 hours.  Recent Labs Lab 04/15/15 1843 04/16/15 1010 04/17/15 0232 04/18/15 0256 04/19/15 0223  AMMONIA 110* 76* 88* 78* 64*   CBC:  Recent Labs Lab 04/15/15 1843 04/16/15 0523 04/17/15 0232 04/18/15 0256  WBC 11.3* 10.7* 10.7* 14.7*  NEUTROABS 7.4 6.9 6.5 11.2*  HGB 8.7* 8.8* 9.3* 9.1*  HCT 25.7* 26.3* 27.2* 27.0*  MCV 93.5 93.3 92.8 92.2  PLT 181 166 131* 140*   Cardiac Enzymes: No results for input(s): CKTOTAL, CKMB, CKMBINDEX, TROPONINI in the last 168 hours. BNP (last 3 results) No results for input(s): BNP in the last  8760 hours.  ProBNP (last 3 results) No results for input(s): PROBNP in the last 8760 hours.  CBG:  Recent Labs Lab 04/16/15 0110 04/16/15 0519 04/16/15 1237 04/16/15 1657 04/17/15 0834  GLUCAP 126* 138* 164* 115* 143*    Recent Results (from the past 240 hour(s))  Urine culture     Status: None   Collection Time: 04/15/15  6:12 PM  Result Value Ref Range Status   Specimen Description URINE, RANDOM  Final   Special Requests NONE  Final   Culture >=100,000 COLONIES/mL KLEBSIELLA PNEUMONIAE  Final   Report Status 04/19/2015 FINAL  Final   Organism ID, Bacteria KLEBSIELLA  PNEUMONIAE  Final      Susceptibility   Klebsiella pneumoniae - MIC*    AMPICILLIN >=32 RESISTANT Resistant     CEFAZOLIN <=4 SENSITIVE Sensitive     CEFTRIAXONE <=1 SENSITIVE Sensitive     CIPROFLOXACIN <=0.25 SENSITIVE Sensitive     GENTAMICIN <=1 SENSITIVE Sensitive     IMIPENEM <=0.25 SENSITIVE Sensitive     NITROFURANTOIN 128 RESISTANT Resistant     TRIMETH/SULFA <=20 SENSITIVE Sensitive     AMPICILLIN/SULBACTAM 4 SENSITIVE Sensitive     PIP/TAZO <=4 SENSITIVE Sensitive     * >=100,000 COLONIES/mL KLEBSIELLA PNEUMONIAE  Blood culture (routine x 2)     Status: None (Preliminary result)   Collection Time: 04/15/15  8:31 PM  Result Value Ref Range Status   Specimen Description BLOOD RIGHT ARM  Final   Special Requests BOTTLES DRAWN AEROBIC AND ANAEROBIC 5CC  Final   Culture NO GROWTH 4 DAYS  Final   Report Status PENDING  Incomplete  Blood culture (routine x 2)     Status: None (Preliminary result)   Collection Time: 04/15/15  8:37 PM  Result Value Ref Range Status   Specimen Description BLOOD RIGHT HAND  Final   Special Requests BOTTLES DRAWN AEROBIC ONLY 4CC  Final   Culture NO GROWTH 4 DAYS  Final   Report Status PENDING  Incomplete  MRSA PCR Screening     Status: None   Collection Time: 04/16/15  5:49 AM  Result Value Ref Range Status   MRSA by PCR NEGATIVE NEGATIVE Final    Comment:        The GeneXpert MRSA Assay (FDA approved for NASAL specimens only), is one component of a comprehensive MRSA colonization surveillance program. It is not intended to diagnose MRSA infection nor to guide or monitor treatment for MRSA infections.      Studies:  Recent x-ray studies have been reviewed in detail by the Attending Physician  Scheduled Meds:  Scheduled Meds: . antiseptic oral rinse  7 mL Mouth Rinse BID  . cefTRIAXone (ROCEPHIN)  IV  1 g Intravenous Q24H  . famotidine (PEPCID) IV  20 mg Intravenous Q24H  . folic acid  1 mg Intravenous Daily  . lactulose  30 g  Per Tube QID  . rifaximin  550 mg Per Tube BID  . thiamine  100 mg Intravenous Daily    Time spent on care of this patient: 40 mins   WOODS, Roselind Messier , MD  [facility] Office  [phone] Pager - [phone]  On-Call/Text Page:      [email or web address]      password [email or web address]  If 7PM-7AM, please contact night-coverage [email or web address] Password [email or web address] 04/19/2015, 9:06 PM   LOS: 4 days   Care during the described time interval was provided by me .  I have reviewed this patient's available data, including medical  history, events of note, physical examination, and all test results as part of my evaluation. I have personally reviewed and interpreted all radiology studies.   Dia Crawford, MD [phone] Pager

## 2015-04-19 NOTE — Progress Notes (Signed)
Pt. Arrived from [facility] with nurse and tech moaning with garbled speech. Will continue to monitor. Melina Schools, RN 04/19/2015 11:21 PM

## 2015-04-20 ENCOUNTER — Encounter (HOSPITAL_COMMUNITY): Payer: Self-pay | Admitting: Radiology

## 2015-04-20 ENCOUNTER — Inpatient Hospital Stay (HOSPITAL_COMMUNITY): Payer: Medicare Other

## 2015-04-20 LAB — CULTURE, BLOOD (ROUTINE X 2)
CULTURE: NO GROWTH
Culture: NO GROWTH

## 2015-04-20 LAB — COMPREHENSIVE METABOLIC PANEL
ALBUMIN: 2 g/dL — AB (ref 3.5–5.0)
ALK PHOS: 110 U/L (ref 38–126)
ALT: 87 U/L — AB (ref 14–54)
ANION GAP: 9 (ref 5–15)
AST: 214 U/L — ABNORMAL HIGH (ref 15–41)
BUN: 15 mg/dL (ref 6–20)
CHLORIDE: 115 mmol/L — AB (ref 101–111)
CO2: 17 mmol/L — AB (ref 22–32)
Calcium: 7.7 mg/dL — ABNORMAL LOW (ref 8.9–10.3)
Creatinine, Ser: 0.81 mg/dL (ref 0.44–1.00)
GFR calc non Af Amer: >60 mL/min (ref >60)
GLUCOSE: 145 mg/dL — AB (ref 65–99)
Potassium: 3.2 mmol/L — ABNORMAL LOW (ref 3.5–5.1)
SODIUM: 141 mmol/L (ref 135–145)
Total Bilirubin: 1.1 mg/dL (ref 0.3–1.2)
Total Protein: 5.6 g/dL — ABNORMAL LOW (ref 6.5–8.1)

## 2015-04-20 LAB — LIPASE, BLOOD: Lipase: 16 U/L — ABNORMAL LOW (ref 22–51)

## 2015-04-20 LAB — MAGNESIUM: Magnesium: 1.8 mg/dL (ref 1.7–2.4)

## 2015-04-20 LAB — AMMONIA: AMMONIA: 83 umol/L — AB (ref 9–35)

## 2015-04-20 MED ORDER — POTASSIUM CHLORIDE 10 MEQ/100ML IV SOLN
10.0000 meq | INTRAVENOUS | Status: AC
  Administered 2015-04-20 (×3): 10 meq via INTRAVENOUS
  Filled 2015-04-20 (×3): qty 100

## 2015-04-20 MED ORDER — LACTULOSE ENEMA
300.0000 mL | Freq: Once | ORAL | Status: AC
  Administered 2015-04-20: 300 mL via RECTAL
  Filled 2015-04-20: qty 300

## 2015-04-20 NOTE — Progress Notes (Signed)
Pt with no urine output up until this point.  Bladder scan for 374.  MD made aware, awaiting further instructions.  Will continue to monitor. Sondra Come, RN

## 2015-04-20 NOTE — Progress Notes (Signed)
Progress Note  Vanessa Hoffman ZOX:[id number] DOB: Dec 16, 1955 DOA: 04/15/2015 PCP: No primary care provider on file.  Admit HPI / Brief Narrative: Vanessa Hoffman is a 59 y.o.WF Fm Local nursing home PMHx Anxiety, Fibromyalgia, Depression, Alcohol Abuse, tobacco abuse, HTN, HLD, DM Type 2 without complication.  Transferred from a local nursing home, where she was discharged to following a skull fracture and subdural hematoma early last month. There is not much information available, but apparently the patient is typically able to speak and have a conversation. Today in the afternoon patient was found altered and not responding. To the nursing home knowledge, there hasn't been any history of trauma, fever, nausea, emesis, diarrhea or any other symptoms. Her parents state that her appetite and oral intake have been decreased for the past few days.    HPI/Subjective: Will open eyes and attempts to speak a few words  Assessment/Plan: Altered mental status  -per record, patient was to undergo a craniotomy- Neurosurgery consult -Brain MRI; subacute/chronic SDH -EEG; ambiguous readings see results below - per mom, patient has improved yest and will respond  Alcohol abuse/hepatic encephalopathy -Ethanol level; negative ( was not obtained on admission ); mother verifies patient drinks 1/2 of a fifth.Vodka/ day minimum -UDS; negative (mother confirms uses multiple drugs) -on lactulose PO QID, xifaxin-- add enema today- does not appear to be having 3 BMS per day  Elevated ammonia - NG tube -Continue Lactulose 30gm QID -Continue rifaximin 550 mg BID  Elevated liver enzymes -Patient with history of alcohol abuse and elevated ammonia -Acute hepatitis panel negative  -HIV panel negative -Abdominal ultrasound; moderate ascites, fatty infiltration of liver see results below  Anion gap Metabolic Acidosis -Resolved w/ volume expansion    Hypokalemia -replte  Hypomagnesemia -replete  Frequent falls -Secondary to intoxication?  Hyperlipidemia -Resume pravastatin once LFTS normal  Essential HTN  -Stable.   Anemia -Anemia panel suggestive of anemia of chronic disease or malnutrition   AKI (acute kidney injury) -Improving with hydration  UTI Klebsiella pneumonia - antibiotics    Code Status: FULL Family Communication: Spoke with mother   Disposition Plan:     Consultants: Verbal consult with Dr. Amada Jupiter (neurology)     Procedure/Significant Events: CT head without contrast:Minimal decrease in size of a right sided mixed acuity SDH -Minimal right-to-left midline shift. - negative acute processes noted.- Cerebral atrophy and small vessel ischemic change. -Similar left occipital nondisplaced skull fracture. 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 - Mild midline shift of 2.3 mm to the left is unchanged. No new hemorrhage 8/24 EEG;Electrographic seizure activity cannot be ruled out. Repeat study with the patient in a more relaxed state is recommended 8/23 abdominal ultrasound;- Increased hepatic echotexture C/W fatty infiltration.  -cysts in the right lobe-  Sludge versus tiny non shadowing stones in the gallbladder. - No sonographic evidence of acute cholecystitis. -Moderate ascites.   Culture 8/22 urine positive Klebsiella pneumonia 8/22 blood right arm/hand NGTD   Antibiotics: Ceftriaxone 8/22>>  DVT prophylaxis: SCD   Devices    LINES / TUBES:      Continuous Infusions: . dextrose 5 % and 0.45% NaCl 75 mL/hr at 04/20/15 0901    Objective: VITAL SIGNS: Temp: 97.8 F (36.6 C) (08/27 1100) Temp Source: Axillary (08/27 1100) BP: 136/55 mmHg (08/27 1100) Pulse Rate: 90 (08/27 1100) SPO2; FIO2:   Intake/Output Summary (Last 24 hours) at 04/20/15 1248 Last data filed at 04/20/15 0454  Gross per 24 hour  Intake    750  ml  Output   1000 ml  Net   -250 ml     Exam: General:  Awake ,chronically ill appearing Lungs: Clear to auscultation bilaterally without wheezes or crackles Cardiovascular: Regular rhythm and rate without murmur gallop or rub normal S1 and S2 Abdomen:negative abdominal pain, positive soft, bowel sounds, no rebound, no ascites, no appreciable mass Extremities: No significant cyanosis, clubbing, or edema bilateral lower extremities       Data Reviewed: Basic Metabolic Panel:  Recent Labs Lab 04/15/15 1843 04/16/15 0523 04/17/15 0232 04/18/15 0256 04/20/15 0430  NA 144 143 142 143 141  K 4.0 3.5 3.1* 3.1* 3.2*  CL 108 109 108 111 115*  CO2 13* 17* 22 22 17*  GLUCOSE 76 136* 152* 178* 145*  BUN 25* 24* 19 15 15   CREATININE 2.05* 1.88* 1.26* 1.07* 0.81  CALCIUM 9.2 9.0 8.4* 8.1* 7.7*  MG  --   --   --  1.4* 1.8  PHOS  --   --   --  1.3*  --    Liver Function Tests:  Recent Labs Lab 04/15/15 1843 04/16/15 0523 04/17/15 0232 04/18/15 0256 04/20/15 0430  AST 242* 243* 207* 194* 214*  ALT 86* 90* 84* 85* 87*  ALKPHOS 103 102 99 103 110  BILITOT 2.5* 2.2* 1.4* 1.1 1.1  PROT 7.0 7.1 6.5 6.5 5.6*  ALBUMIN 2.7* 2.7* 2.5* 2.3* 2.0*    Recent Labs Lab 04/20/15 0430  LIPASE 16*    Recent Labs Lab 04/16/15 1010 04/17/15 0232 04/18/15 0256 04/19/15 0223 04/20/15 0430  AMMONIA 76* 88* 78* 64* 83*   CBC:  Recent Labs Lab 04/15/15 1843 04/16/15 0523 04/17/15 0232 04/18/15 0256  WBC 11.3* 10.7* 10.7* 14.7*  NEUTROABS 7.4 6.9 6.5 11.2*  HGB 8.7* 8.8* 9.3* 9.1*  HCT 25.7* 26.3* 27.2* 27.0*  MCV 93.5 93.3 92.8 92.2  PLT 181 166 131* 140*   Cardiac Enzymes: No results for input(s): CKTOTAL, CKMB, CKMBINDEX, TROPONINI in the last 168 hours. BNP (last 3 results) No results for input(s): BNP in the last 8760 hours.  ProBNP (last 3 results) No results for input(s): PROBNP in the last 8760 hours.  CBG:  Recent Labs Lab 04/16/15 0110 04/16/15 0519  04/16/15 1237 04/16/15 1657 04/17/15 0834  GLUCAP 126* 138* 164* 115* 143*    Recent Results (from the past 240 hour(s))  Urine culture     Status: None   Collection Time: 04/15/15  6:12 PM  Result Value Ref Range Status   Specimen Description URINE, RANDOM  Final   Special Requests NONE  Final   Culture >=100,000 COLONIES/mL KLEBSIELLA PNEUMONIAE  Final   Report Status 04/19/2015 FINAL  Final   Organism ID, Bacteria KLEBSIELLA PNEUMONIAE  Final      Susceptibility   Klebsiella pneumoniae - MIC*    AMPICILLIN >=32 RESISTANT Resistant     CEFAZOLIN <=4 SENSITIVE Sensitive     CEFTRIAXONE <=1 SENSITIVE Sensitive     CIPROFLOXACIN <=0.25 SENSITIVE Sensitive     GENTAMICIN <=1 SENSITIVE Sensitive     IMIPENEM <=0.25 SENSITIVE Sensitive     NITROFURANTOIN 128 RESISTANT Resistant     TRIMETH/SULFA <=20 SENSITIVE Sensitive     AMPICILLIN/SULBACTAM 4 SENSITIVE Sensitive     PIP/TAZO <=4 SENSITIVE Sensitive     * >=100,000 COLONIES/mL KLEBSIELLA PNEUMONIAE  Blood culture (routine x 2)     Status: None (Preliminary result)   Collection Time: 04/15/15  8:31 PM  Result  Value Ref Range Status   Specimen Description BLOOD RIGHT ARM  Final   Special Requests BOTTLES DRAWN AEROBIC AND ANAEROBIC 5CC  Final   Culture NO GROWTH 4 DAYS  Final   Report Status PENDING  Incomplete  Blood culture (routine x 2)     Status: None (Preliminary result)   Collection Time: 04/15/15  8:37 PM  Result Value Ref Range Status   Specimen Description BLOOD RIGHT HAND  Final   Special Requests BOTTLES DRAWN AEROBIC ONLY 4CC  Final   Culture NO GROWTH 4 DAYS  Final   Report Status PENDING  Incomplete  MRSA PCR Screening     Status: None   Collection Time: 04/16/15  5:49 AM  Result Value Ref Range Status   MRSA by PCR NEGATIVE NEGATIVE Final    Comment:        The GeneXpert MRSA Assay (FDA approved for NASAL specimens only), is one component of a comprehensive MRSA colonization surveillance program. It is  not intended to diagnose MRSA infection nor to guide or monitor treatment for MRSA infections.       Scheduled Meds:  Scheduled Meds: . antiseptic oral rinse  7 mL Mouth Rinse BID  . cefTRIAXone (ROCEPHIN)  IV  1 g Intravenous Q24H  . famotidine (PEPCID) IV  20 mg Intravenous Q24H  . folic acid  1 mg Intravenous Daily  . lactulose  30 g Per Tube QID  . lactulose  300 mL Rectal Once  . rifaximin  550 mg Per Tube BID  . thiamine  100 mg Intravenous Daily    Time spent on care of this patient: 35 mins   Marlin Canary , DO  [facility] [phone]  On-Call/Text Page:      [email or web address]      password [email or web address]  If 7PM-7AM, please contact night-coverage [email or web address] Password [email or web address] 04/20/2015, 12:48 PM   LOS: 5 days

## 2015-04-21 LAB — COMPREHENSIVE METABOLIC PANEL
ALK PHOS: 120 U/L (ref 38–126)
ALT: 99 U/L — AB (ref 14–54)
AST: 236 U/L — AB (ref 15–41)
Albumin: 2 g/dL — ABNORMAL LOW (ref 3.5–5.0)
Anion gap: 6 (ref 5–15)
BILIRUBIN TOTAL: 1.1 mg/dL (ref 0.3–1.2)
BUN: 12 mg/dL (ref 6–20)
CALCIUM: 8 mg/dL — AB (ref 8.9–10.3)
CHLORIDE: 118 mmol/L — AB (ref 101–111)
CO2: 19 mmol/L — ABNORMAL LOW (ref 22–32)
CREATININE: 0.81 mg/dL (ref 0.44–1.00)
GFR calc Af Amer: >60 mL/min (ref >60)
Glucose, Bld: 153 mg/dL — ABNORMAL HIGH (ref 65–99)
Potassium: 3.4 mmol/L — ABNORMAL LOW (ref 3.5–5.1)
Sodium: 143 mmol/L (ref 135–145)
Total Protein: 6.2 g/dL — ABNORMAL LOW (ref 6.5–8.1)

## 2015-04-21 LAB — CBC
HEMATOCRIT: 34.4 % — AB (ref 36.0–46.0)
Hemoglobin: 11.4 g/dL — ABNORMAL LOW (ref 12.0–15.0)
MCH: 31 pg (ref 26.0–34.0)
MCHC: 33.1 g/dL (ref 30.0–36.0)
MCV: 93.5 fL (ref 78.0–100.0)
Platelets: 101 10*3/uL — ABNORMAL LOW (ref 150–400)
RBC: 3.68 MIL/uL — ABNORMAL LOW (ref 3.87–5.11)
RDW: 16.3 % — AB (ref 11.5–15.5)
WBC: 12.7 10*3/uL — ABNORMAL HIGH (ref 4.0–10.5)

## 2015-04-21 LAB — MAGNESIUM: MAGNESIUM: 1.7 mg/dL (ref 1.7–2.4)

## 2015-04-21 LAB — AMMONIA: AMMONIA: 73 umol/L — AB (ref 9–35)

## 2015-04-21 NOTE — Evaluation (Signed)
Clinical/Bedside Swallow Evaluation Patient Details  Name: Vanessa Hoffman MRN: [id number] Date of Birth: [birth year]  Today's Date: 04/21/2015 Time: SLP Start Time (ACUTE ONLY): 1330 SLP Stop Time (ACUTE ONLY): 1402 SLP Time Calculation (min) (ACUTE ONLY): 32 min  Past Medical History:  Past Medical History  Diagnosis Date  . Anxiety   . Fibromyalgia   . Osteoporosis   . Depression   . Hyperlipidemia   . Hypertension   . Substance abuse     ETOH, reportedly in past, l  . Arthritis   . DDD (degenerative disc disease)   . Diabetes mellitus without complication    Past Surgical History:  Past Surgical History  Procedure Laterality Date  . Cervical disc surgery  2001    3 fusions  . Cesarean section      2   HPI:  Pt is a 59 y.o.F SNF resident w/ a Hx Anxiety, Fibromyalgia, Depression, Alcohol Abuse, vascular dementia, tobacco abuse, HTN, HLD, DM2, and a recent skkull fx w/ subdural hematoma who presented to the ED w/ altered mental status. The patient is typically able to speak and have a conversation. The patient was reportedly found in an altered state, not responding. To the nursing home knowledge, there hadn't been any history of trauma, fever, nausea, emesis, diarrhea or any other symptoms. Her family stated her appetite and oral intake had been decreased for a few days. Pt has been recieving temporary nutrition via NG tube, previously not alert for PO.     Assessment / Plan / Recommendation Clinical Impression  Pt alert yet moaning and grunting consistently with decreased mentation and inability to follow commands during BSE. Oral cavity appearing very xerostomic with dried secretions. Oral care performed with notable improvement. Pts timing and coordination of swallow initiation appearing delayed with all consistencies. Vocal quality intermittently wet following ice chips and thin liquids concerning for reduced airway protection. Puree consistencies exhibited better control  and timing of swallow initation. However pt at increased risk for aspiration due to moderate decline in cognitive status and fluctuation in mentation. Recommend MBS prior to diet implementation. Will attempt tomorrow. Continue NPO at this time with medicines via alternative means.     Aspiration Risk  Moderate    Diet Recommendation NPO   Medication Administration: Via alternative means    Other  Recommendations Oral Care Recommendations: Oral care QID   Follow Up Recommendations       Frequency and Duration min 2x/week  2 weeks   Pertinent Vitals/Pain     SLP Swallow Goals     Swallow Study Prior Functional Status       General Date of Onset: 04/15/15 Other Pertinent Information: Pt is a 59 y.o.F SNF resident w/ a Hx Anxiety, Fibromyalgia, Depression, Alcohol Abuse, vascular dementia, tobacco abuse, HTN, HLD, DM2, and a recent skkull fx w/ subdural hematoma who presented to the ED w/ altered mental status. The patient is typically able to speak and have a conversation. The patient was reportedly found in an altered state, not responding. To the nursing home knowledge, there hadn't been any history of trauma, fever, nausea, emesis, diarrhea or any other symptoms. Her family stated her appetite and oral intake had been decreased for a few days. Pt has been recieving temporary nutrition via NG tube, previously not alert for PO.   Type of Study: Bedside swallow evaluation Diet Prior to this Study: NPO (NG tube) Temperature Spikes Noted: No Respiratory Status: Room air History of Recent Intubation: No Behavior/Cognition:  Alert;Distractible;Requires cueing;Doesn't follow directions Oral Cavity - Dentition: Adequate natural dentition/normal for age Self-Feeding Abilities: Total assist Patient Positioning: Upright in bed Baseline Vocal Quality: Low vocal intensity Volitional Cough: Cognitively unable to elicit    Oral/Motor/Sensory Function Overall Oral Motor/Sensory Function:  Impaired Labial Symmetry: Within Functional Limits Labial Sensation: Reduced Facial Symmetry: Within Functional Limits Facial Sensation: Reduced   Ice Chips Ice chips: Impaired Presentation: Spoon Oral Phase Impairments: Reduced labial seal;Reduced lingual movement/coordination;Impaired anterior to posterior transit Oral Phase Functional Implications: Prolonged oral transit;Right anterior spillage;Left anterior spillage Pharyngeal Phase Impairments: Suspected delayed Swallow;Wet Vocal Quality   Thin Liquid Thin Liquid: Impaired Presentation: Spoon;Cup Oral Phase Impairments: Reduced labial seal;Reduced lingual movement/coordination;Impaired anterior to posterior transit Oral Phase Functional Implications: Prolonged oral transit Pharyngeal  Phase Impairments: Suspected delayed Swallow;Wet Vocal Quality    Nectar Thick Nectar Thick Liquid: Not tested   Honey Thick Honey Thick Liquid: Not tested   Puree Puree: Impaired Presentation: Spoon Oral Phase Impairments: Reduced labial seal;Reduced lingual movement/coordination Oral Phase Functional Implications: Prolonged oral transit   Solid   GO    Marcene Duos MA, CCC-SLP Acute Care Speech Language Pathologist    Solid: Not tested       Marcene Duos E 04/21/2015,2:12 PM

## 2015-04-21 NOTE — Progress Notes (Signed)
Progress Note  Vanessa Hoffman:[id number] DOB: 10/08/1955 DOA: 04/15/2015 PCP: No primary care provider on file.  Admit HPI / Brief Narrative: Vanessa Hoffman is a 59 y.o.WF Fm Local nursing home PMHx Anxiety, Fibromyalgia, Depression, Alcohol Abuse, tobacco abuse, HTN, HLD, DM Type 2 without complication.  Transferred from a local nursing home, where she was discharged to following a skull fracture and subdural hematoma early last month. There is not much information available, but apparently the patient is typically able to speak and have a conversation. Today in the afternoon patient was found altered and not responding. To the nursing home knowledge, there hasn't been any history of trauma, fever, nausea, emesis, diarrhea or any other symptoms. Her parents state that her appetite and oral intake have been decreased for the past few days.    HPI/Subjective: Moans in response  Assessment/Plan: Altered mental status  -per record, patient was to undergo a craniotomy- Neurosurgery consult- CT scan stable -Brain MRI; subacute/chronic SDH -EEG; ambiguous readings see results below - per mom, patient has improved yest and will respond  Alcohol abuse/hepatic encephalopathy -Ethanol level; negative ( was not obtained on admission ); mother verifies patient drinks 1/2 of a fifth.Vodka/ day minimum -UDS; negative (mother confirms uses multiple drugs) -on lactulose PO QID, xifaxin - LABS pending  Elevated ammonia - NG tube -Continue Lactulose 30gm QID -Continue rifaximin 550 mg BID  Elevated liver enzymes -Patient with history of alcohol abuse and elevated ammonia -Acute hepatitis panel negative  -HIV panel negative -Abdominal ultrasound; moderate ascites, fatty infiltration of liver see results below  Anion gap Metabolic Acidosis -Resolved w/ volume expansion   Hypokalemia -replte  Hypomagnesemia -replete  Frequent falls -Secondary to  intoxication?  Hyperlipidemia -Resume pravastatin once LFTS normal  Essential HTN  -Stable.   Anemia -Anemia panel suggestive of anemia of chronic disease or malnutrition   AKI (acute kidney injury) -Improving with hydration  UTI Klebsiella pneumonia - antibiotics    Code Status: FULL Family Communication: Spoke with mother 8/28  Disposition Plan:     Consultants: Verbal consult with Dr. Amada Jupiter (neurology)     Procedure/Significant Events: CT head without contrast:Minimal decrease in size of a right sided mixed acuity SDH -Minimal right-to-left midline shift. - negative acute processes noted.- Cerebral atrophy and small vessel ischemic change. -Similar left occipital nondisplaced skull fracture. 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 - Mild midline shift of 2.3 mm to the left is unchanged. No new hemorrhage 8/24 EEG;Electrographic seizure activity cannot be ruled out. Repeat study with the patient in a more relaxed state is recommended 8/23 abdominal ultrasound;- Increased hepatic echotexture C/W fatty infiltration.  -cysts in the right lobe-  Sludge versus tiny non shadowing stones in the gallbladder. - No sonographic evidence of acute cholecystitis. -Moderate ascites.   Culture 8/22 urine positive Klebsiella pneumonia 8/22 blood right arm/hand NGTD   Antibiotics: Ceftriaxone 8/22>>  DVT prophylaxis: SCD   Devices    LINES / TUBES:      Continuous Infusions: . dextrose 5 % and 0.45% NaCl 100 mL/hr at 04/21/15 8119    Objective: VITAL SIGNS: Temp: 98 F (36.7 C) (08/28 0524) Temp Source: Axillary (08/28 0524) BP: 116/64 mmHg (08/28 0524) Pulse Rate: 99 (08/28 0524) SPO2; FIO2:   Intake/Output Summary (Last 24 hours) at 04/21/15 1016 Last data filed at 04/20/15 1805  Gross per 24 hour  Intake      0 ml  Output    900  ml  Net   -900 ml     Exam: General:  Awake ,chronically  ill appearing ,moaning Lungs: Clear to auscultation bilaterally without wheezes or crackles Cardiovascular: Regular rhythm and rate without murmur gallop or rub normal S1 and S2 Abdomen:negative abdominal pain, positive soft, bowel sounds, no rebound, no ascites, no appreciable mass Extremities: No significant cyanosis, clubbing, or edema bilateral lower extremities       Data Reviewed: Basic Metabolic Panel:  Recent Labs Lab 04/15/15 1843 04/16/15 0523 04/17/15 0232 04/18/15 0256 04/20/15 0430  NA 144 143 142 143 141  K 4.0 3.5 3.1* 3.1* 3.2*  CL 108 109 108 111 115*  CO2 13* 17* 22 22 17*  GLUCOSE 76 136* 152* 178* 145*  BUN 25* 24* 19 15 15   CREATININE 2.05* 1.88* 1.26* 1.07* 0.81  CALCIUM 9.2 9.0 8.4* 8.1* 7.7*  MG  --   --   --  1.4* 1.8  PHOS  --   --   --  1.3*  --    Liver Function Tests:  Recent Labs Lab 04/15/15 1843 04/16/15 0523 04/17/15 0232 04/18/15 0256 04/20/15 0430  AST 242* 243* 207* 194* 214*  ALT 86* 90* 84* 85* 87*  ALKPHOS 103 102 99 103 110  BILITOT 2.5* 2.2* 1.4* 1.1 1.1  PROT 7.0 7.1 6.5 6.5 5.6*  ALBUMIN 2.7* 2.7* 2.5* 2.3* 2.0*    Recent Labs Lab 04/20/15 0430  LIPASE 16*    Recent Labs Lab 04/16/15 1010 04/17/15 0232 04/18/15 0256 04/19/15 0223 04/20/15 0430  AMMONIA 76* 88* 78* 64* 83*   CBC:  Recent Labs Lab 04/15/15 1843 04/16/15 0523 04/17/15 0232 04/18/15 0256  WBC 11.3* 10.7* 10.7* 14.7*  NEUTROABS 7.4 6.9 6.5 11.2*  HGB 8.7* 8.8* 9.3* 9.1*  HCT 25.7* 26.3* 27.2* 27.0*  MCV 93.5 93.3 92.8 92.2  PLT 181 166 131* 140*   Cardiac Enzymes: No results for input(s): CKTOTAL, CKMB, CKMBINDEX, TROPONINI in the last 168 hours. BNP (last 3 results) No results for input(s): BNP in the last 8760 hours.  ProBNP (last 3 results) No results for input(s): PROBNP in the last 8760 hours.  CBG:  Recent Labs Lab 04/16/15 0110 04/16/15 0519 04/16/15 1237 04/16/15 1657 04/17/15 0834  GLUCAP 126* 138* 164* 115*  143*    Recent Results (from the past 240 hour(s))  Urine culture     Status: None   Collection Time: 04/15/15  6:12 PM  Result Value Ref Range Status   Specimen Description URINE, RANDOM  Final   Special Requests NONE  Final   Culture >=100,000 COLONIES/mL KLEBSIELLA PNEUMONIAE  Final   Report Status 04/19/2015 FINAL  Final   Organism ID, Bacteria KLEBSIELLA PNEUMONIAE  Final      Susceptibility   Klebsiella pneumoniae - MIC*    AMPICILLIN >=32 RESISTANT Resistant     CEFAZOLIN <=4 SENSITIVE Sensitive     CEFTRIAXONE <=1 SENSITIVE Sensitive     CIPROFLOXACIN <=0.25 SENSITIVE Sensitive     GENTAMICIN <=1 SENSITIVE Sensitive     IMIPENEM <=0.25 SENSITIVE Sensitive     NITROFURANTOIN 128 RESISTANT Resistant     TRIMETH/SULFA <=20 SENSITIVE Sensitive     AMPICILLIN/SULBACTAM 4 SENSITIVE Sensitive     PIP/TAZO <=4 SENSITIVE Sensitive     * >=100,000 COLONIES/mL KLEBSIELLA PNEUMONIAE  Blood culture (routine x 2)     Status: None   Collection Time: 04/15/15  8:31 PM  Result Value Ref Range Status   Specimen Description BLOOD RIGHT ARM  Final   Special Requests BOTTLES DRAWN AEROBIC AND ANAEROBIC 5CC  Final   Culture NO GROWTH 5 DAYS  Final   Report Status 04/20/2015 FINAL  Final  Blood culture (routine x 2)     Status: None   Collection Time: 04/15/15  8:37 PM  Result Value Ref Range Status   Specimen Description BLOOD RIGHT HAND  Final   Special Requests BOTTLES DRAWN AEROBIC ONLY 4CC  Final   Culture NO GROWTH 5 DAYS  Final   Report Status 04/20/2015 FINAL  Final  MRSA PCR Screening     Status: None   Collection Time: 04/16/15  5:49 AM  Result Value Ref Range Status   MRSA by PCR NEGATIVE NEGATIVE Final    Comment:        The GeneXpert MRSA Assay (FDA approved for NASAL specimens only), is one component of a comprehensive MRSA colonization surveillance program. It is not intended to diagnose MRSA infection nor to guide or monitor treatment for MRSA infections.        Scheduled Meds:  Scheduled Meds: . antiseptic oral rinse  7 mL Mouth Rinse BID  . cefTRIAXone (ROCEPHIN)  IV  1 g Intravenous Q24H  . famotidine (PEPCID) IV  20 mg Intravenous Q24H  . folic acid  1 mg Intravenous Daily  . lactulose  30 g Per Tube QID  . rifaximin  550 mg Per Tube BID  . thiamine  100 mg Intravenous Daily    Time spent on care of this patient: 35 mins   Marlin Canary , DO  [facility] [phone]  On-Call/Text Page:      [email or web address]      password [email or web address]  If 7PM-7AM, please contact night-coverage [email or web address] Password [email or web address] 04/21/2015, 10:16 AM   LOS: 6 days

## 2015-04-22 ENCOUNTER — Inpatient Hospital Stay (HOSPITAL_COMMUNITY): Payer: Medicare Other

## 2015-04-22 LAB — AMMONIA: Ammonia: 96 umol/L — ABNORMAL HIGH (ref 9–35)

## 2015-04-22 LAB — COMPREHENSIVE METABOLIC PANEL
ALBUMIN: 2 g/dL — AB (ref 3.5–5.0)
ALT: 106 U/L — AB (ref 14–54)
AST: 272 U/L — AB (ref 15–41)
Alkaline Phosphatase: 126 U/L (ref 38–126)
Anion gap: 8 (ref 5–15)
BILIRUBIN TOTAL: 0.9 mg/dL (ref 0.3–1.2)
BUN: 11 mg/dL (ref 6–20)
CHLORIDE: 117 mmol/L — AB (ref 101–111)
CO2: 18 mmol/L — ABNORMAL LOW (ref 22–32)
Calcium: 7.9 mg/dL — ABNORMAL LOW (ref 8.9–10.3)
Creatinine, Ser: 0.79 mg/dL (ref 0.44–1.00)
GFR calc Af Amer: >60 mL/min (ref >60)
GFR calc non Af Amer: >60 mL/min (ref >60)
GLUCOSE: 158 mg/dL — AB (ref 65–99)
POTASSIUM: 2.8 mmol/L — AB (ref 3.5–5.1)
Sodium: 143 mmol/L (ref 135–145)
TOTAL PROTEIN: 6.1 g/dL — AB (ref 6.5–8.1)

## 2015-04-22 LAB — MAGNESIUM: Magnesium: 1.6 mg/dL — ABNORMAL LOW (ref 1.7–2.4)

## 2015-04-22 MED ORDER — POTASSIUM CHLORIDE 10 MEQ/100ML IV SOLN
10.0000 meq | INTRAVENOUS | Status: AC
  Administered 2015-04-22 (×3): 10 meq via INTRAVENOUS
  Filled 2015-04-22 (×3): qty 100

## 2015-04-22 MED ORDER — MAGNESIUM SULFATE 2 GM/50ML IV SOLN
2.0000 g | Freq: Once | INTRAVENOUS | Status: AC
  Administered 2015-04-22: 2 g via INTRAVENOUS
  Filled 2015-04-22: qty 50

## 2015-04-22 NOTE — Evaluation (Signed)
SLP Cancellation Note  Patient Details Name: DUHA ABAIR MRN: [id number] DOB: 08/10/1956   Cancelled treatment:       Reason Eval/Treat Not Completed: Fatigue/lethargy limiting ability to participate   Donavan Burnet, MS [facility] SLP [phone]

## 2015-04-22 NOTE — Clinical Social Work Note (Signed)
CSW provided an update to patient's husband an update.  Thayer Ohm, husband, states patient is from [facility] and will return via [facility] at time of discharge.  Vickii Penna, LCSW [phone]  Psychiatric & Orthopedics (5N 1-8) Clinical Social Worker

## 2015-04-22 NOTE — Care Management Important Message (Addendum)
Important Message  Patient Details  Name: Vanessa Hoffman MRN: [id number] Date of Birth: 1955/12/04   Medicare Important Message Given:  Yes-second notification given    Orson Aloe 04/22/2015, 11:32 AM

## 2015-04-22 NOTE — Progress Notes (Signed)
Speech Language Pathology Treatment: Dysphagia  Patient Details Name: Vanessa Hoffman MRN: [id number] DOB: 1955-10-25 Today's Date: 04/22/2015 Time: 5638-7564 SLP Time Calculation (min) (ACUTE ONLY): 11 min  Assessment / Plan / Recommendation Clinical Impression  Pt today sleepy but would open her eyes and follow commands intermittently.  Swallow elicited with applesauce bolus - albeit significantly delayed.  Oral suctioning of portion of bolus completed with max assistance.   No overt indication of aspiration.  Recommend proceed with MBS today to allow instrumental evaluation to determine least restrictive diet.  Pt nodded head yes to plan as her speech is unintelligible.    If pt will need replacement for alternative means of nutrition, small bore tube may be beneficial for airway protection and comfort.  Order received for NG removal and MBS.  RN informed and requested to remove NG.     HPI Other Pertinent Information: Pt is a 59 y.o.F SNF resident w/ a Hx Anxiety, Fibromyalgia, Depression, Alcohol Abuse, vascular dementia, tobacco abuse, HTN, HLD, DM2, and a recent skkull fx w/ subdural hematoma who presented to the ED w/ altered mental status. The patient is typically able to speak and have a conversation. The patient was reportedly found in an altered state, not responding. To the nursing home knowledge, there hadn't been any history of trauma, fever, nausea, emesis, diarrhea or any other symptoms. Her family stated her appetite and oral intake had been decreased for a few days. Pt has been recieving temporary nutrition via NG tube, previously not alert for PO.     Pertinent Vitals Pain Assessment: Faces Pain Score: 0-No pain  SLP Plan  MBS (today)    Recommendations Diet recommendations: NPO Medication Administration: Via alternative means              Plan: MBS (today)    GO     Donavan Burnet, MS [facility] SLP [phone]

## 2015-04-22 NOTE — Progress Notes (Signed)
CHL IP CLINICAL IMPRESSIONS 04/22/2015  Therapy Diagnosis Moderate pharyngeal phase dysphagia;Severe oral phase dysphagia;Moderate oral phase dysphagia; esophageal phase dysphagia suspected as well  Clinical Impression   Moderately severe oral and moderate pharyngeal dysphagia with sensorimotor deficits and largely impacted by pt's mental status.  Pt was fully alert but did not follow commands to dry swallow or cough consistently despite max verbal and visual cues.  Oral transiting delays were severe - with boluses essentially spilling into pharynx with delayed swallow reponse to pyriform with thin liquids  Swallow was marginally weak with resultant oropharyngeal residuals that mixed with secretions.  Anticipate improved secretion management as pt able to consume po.   Pt did tracely and silently aspirate thin via tsp due to delayed swallow and barium spilling into open larynx.  No aspiration of nectar noted however laryngeal penetration of nectar mixed with secretions noted again due to delay swallow.     Pt remains at risk of aspiration due to mentation - options include to continue npo except single ice chips and necessary medications crushed with pudding with strict precautions and follow clinically for readiness for po.  Or replacement of alternative means of nutrition with small bore feeding tube and single ice chips only - again following clinically for po readiness.  Using live video, educated pt to findings and recommendations.    Will follow for skilled SLP dysphagia treatment.       CHL IP TREATMENT RECOMMENDATION 04/22/2015  Treatment Recommendations Therapy as outlined in treatment plan below    CHL IP DIET RECOMMENDATION 04/22/2015  SLP Diet Recommendations NPO except meds;Ice chips PRN after oral care  Liquid Administration via None   Medication Administration Crushed with puree  Compensations Slow rate;Small sips/bites  Postural Changes and/or Swallow Maneuvers Upright 30 minutes,  oral suction prn         Prominent cricopharyngeal segment;Reduced cricopharyngeal relaxation  Backflow of barium from upper esophagus into pharynx       appearance of poor esophageal clearance throughout - radiologist not present to confirm    Vanessa Burnet, MS [facility] SLP [phone]

## 2015-04-22 NOTE — Progress Notes (Addendum)
Patient ID: Vanessa Hoffman, female   DOB: July 18, 1956, 59 y.o.   MRN: [id number] [facility] PROGRESS NOTE  Vanessa Hoffman ION:[id number] DOB: [birth year] DOA: 04/15/2015 PCP: No primary care provider on file.  Brief narrative:    59 year old female from nursing home with past medical hitory of anxiety, depression, fibromyalgia, hypertension. She presented from SNF where she was residing after sustaining skull fracture and subdural hematoma. She presented with worsening mental status changes, lethargy.   Barrier to discharge: Pt is still NPO, has NG tube in place. SLP evaluated the pt today and recommend strict NPO. I think it is reasonable to see if there is any changes in mental status in next 24-48 hours to try to attempt repeat SLP evaluation. If not much improvement would consider palliative care for goals of care.   Assessment/Plan:    Principal Problem: Acute hepatic encephalopathy - Per CT on this admission, little change in right parietal subdural hematoma. Pt also with history of alcohol abuse so her altered mental status could be from hepatic encephalopathy.  - No significant changes in mental status in past 24 hours; will continue to monitor - Ammonia level 96 this morning  - Continue lactulose and rifaximin - Obtain PT evaluation once pt able to participate   Active Problems: Alcohol abuse - Alcohol level on admission WNL - UDS unremarkable - Patient's mother has confirmed pt drug use history and alcohol abuse   Aspiration risk / Severe protein calorie malnutrition / Failure to thrive in adult  - High risk of aspiration due to altered mental status - Continue aspiration precautions - Continue NG tube - SLP evaluation today - strict NPO for now  Abnormal LFT's  - Likely related to alcoholic hepatitis - Acute hepatitis panel negative  - HIV panel negative - Abdominal ultrasound; moderate ascites  Hypokalemia / Hypomagnesemia  - Likely due to NG tube / GI  losses - Supplemented potassium and magnesium  - Follow up magnesium and potassium in am   Frequent falls - Secondary to intoxication, altered mental status - PT eval once she is able to participate   Hyperlipidemia - Resume pravastatin once LFTS normal  Anemia of chronic disease / Thrombocytopenia  - Due to bone marrow suppression from alcohol abuse - Hemoglobin stable  - Platelets 101 - Using SCD's for DVT prophylaxis   AKI (acute kidney injury) / Metabolic acidosis  - Likely prerenal, dehydration - Improving with hydration  UTI Klebsiella pneumonia / Leukocytosis  - On IV rocephin x 7 days, stop rocephin today     DVT Prophylaxis  - SCD's bilaterally    Code Status: Full.  Family Communication:  Family not at the bedside at this time Disposition Plan: needs PT evaluation   IV access:  Peripheral IV  Procedures and diagnostic studies:    Ct Head Wo Contrast 04/20/2015  Little change in size of RIGHT parietal subdural hematoma.  No new intracranial hemorrhage or infarction identified.   Electronically Signed   By: Ulyses Southward M.D.   On: 04/20/2015 19:16   Dg Swallowing Func-speech Pathology 04/22/2015  Moderately severe oral and moderate pharyngeal dysphagia with sensorimotor  deficits and largely impacted by pt's mental status.   Medical Consultants:  Neurology  Other Consultants:  SLP Physical therapy  IAnti-Infectives:   Rocephin 04/15/2015 --> 04/22/2015 Rifaximin     Manson Passey, MD  [facility] Pager [phone]  Time spent in minutes: 25 minutes  If 7PM-7AM, please contact night-coverage [email or web address] Password  [email or web address] 04/22/2015, 3:52 PM   LOS: 7 days    HPI/Subjective: No acute overnight events. No respiratory distress.   Objective: Filed Vitals:   04/22/15 0219 04/22/15 0600 04/22/15 1119 04/22/15 1504  BP: 125/48 123/59 124/63 133/37  Pulse: 102 100 96 92  Temp: 98.7 F (37.1 C) 97.9 F (36.6 C) 98.3 F (36.8 C) 97.5 F (36.4  C)  TempSrc: Axillary Axillary Axillary Oral  Resp: 20 18 18 20   Height:      Weight:      SpO2: 100% 99% 98% 99%    Intake/Output Summary (Last 24 hours) at 04/22/15 1552 Last data filed at 04/21/15 2000  Gross per 24 hour  Intake      0 ml  Output   1001 ml  Net  -1001 ml    Exam:   General:  Pt is alert, not in acute distress; NG tube in place   Cardiovascular: Regular rate and rhythm, S1/S2 appreciated   Respiratory: Clear to auscultation bilaterally, no wheezing, no crackles, no rhonchi  Abdomen: Soft, non tender, non distended, bowel sounds present  Extremities: No edema, pulses DP and PT palpable bilaterally  Neuro: Grossly nonfocal  Data Reviewed: Basic Metabolic Panel:  Recent Labs Lab 04/17/15 0232 04/18/15 0256 04/20/15 0430 04/21/15 1131 04/22/15 0431  NA 142 143 141 143 143  K 3.1* 3.1* 3.2* 3.4* 2.8*  CL 108 111 115* 118* 117*  CO2 22 22 17* 19* 18*  GLUCOSE 152* 178* 145* 153* 158*  BUN 19 15 15 12 11   CREATININE 1.26* 1.07* 0.81 0.81 0.79  CALCIUM 8.4* 8.1* 7.7* 8.0* 7.9*  MG  --  1.4* 1.8 1.7 1.6*  PHOS  --  1.3*  --   --   --    Liver Function Tests:  Recent Labs Lab 04/17/15 0232 04/18/15 0256 04/20/15 0430 04/21/15 1131 04/22/15 0431  AST 207* 194* 214* 236* 272*  ALT 84* 85* 87* 99* 106*  ALKPHOS 99 103 110 120 126  BILITOT 1.4* 1.1 1.1 1.1 0.9  PROT 6.5 6.5 5.6* 6.2* 6.1*  ALBUMIN 2.5* 2.3* 2.0* 2.0* 2.0*    Recent Labs Lab 04/20/15 0430  LIPASE 16*    Recent Labs Lab 04/18/15 0256 04/19/15 0223 04/20/15 0430 04/21/15 1131 04/22/15 0431  AMMONIA 78* 64* 83* 73* 96*   CBC:  Recent Labs Lab 04/15/15 1843 04/16/15 0523 04/17/15 0232 04/18/15 0256 04/21/15 1131  WBC 11.3* 10.7* 10.7* 14.7* 12.7*  NEUTROABS 7.4 6.9 6.5 11.2*  --   HGB 8.7* 8.8* 9.3* 9.1* 11.4*  HCT 25.7* 26.3* 27.2* 27.0* 34.4*  MCV 93.5 93.3 92.8 92.2 93.5  PLT 181 166 131* 140* 101*   Cardiac Enzymes: No results for input(s):  CKTOTAL, CKMB, CKMBINDEX, TROPONINI in the last 168 hours. BNP: Invalid input(s): POCBNP CBG:  Recent Labs Lab 04/16/15 0110 04/16/15 0519 04/16/15 1237 04/16/15 1657 04/17/15 0834  GLUCAP 126* 138* 164* 115* 143*    Recent Results (from the past 240 hour(s))  Urine culture     Status: None   Collection Time: 04/15/15  6:12 PM  Result Value Ref Range Status   Specimen Description URINE, RANDOM  Final   Special Requests NONE  Final   Culture >=100,000 COLONIES/mL KLEBSIELLA PNEUMONIAE  Final   Report Status 04/19/2015 FINAL  Final   Organism ID, Bacteria KLEBSIELLA PNEUMONIAE  Final      Susceptibility   Klebsiella pneumoniae - MIC*    AMPICILLIN >=32 RESISTANT Resistant  CEFAZOLIN <=4 SENSITIVE Sensitive     CEFTRIAXONE <=1 SENSITIVE Sensitive     CIPROFLOXACIN <=0.25 SENSITIVE Sensitive     GENTAMICIN <=1 SENSITIVE Sensitive     IMIPENEM <=0.25 SENSITIVE Sensitive     NITROFURANTOIN 128 RESISTANT Resistant     TRIMETH/SULFA <=20 SENSITIVE Sensitive     AMPICILLIN/SULBACTAM 4 SENSITIVE Sensitive     PIP/TAZO <=4 SENSITIVE Sensitive     * >=100,000 COLONIES/mL KLEBSIELLA PNEUMONIAE  Blood culture (routine x 2)     Status: None   Collection Time: 04/15/15  8:31 PM  Result Value Ref Range Status   Specimen Description BLOOD RIGHT ARM  Final   Special Requests BOTTLES DRAWN AEROBIC AND ANAEROBIC 5CC  Final   Culture NO GROWTH 5 DAYS  Final   Report Status 04/20/2015 FINAL  Final  Blood culture (routine x 2)     Status: None   Collection Time: 04/15/15  8:37 PM  Result Value Ref Range Status   Specimen Description BLOOD RIGHT HAND  Final   Special Requests BOTTLES DRAWN AEROBIC ONLY 4CC  Final   Culture NO GROWTH 5 DAYS  Final   Report Status 04/20/2015 FINAL  Final  MRSA PCR Screening     Status: None   Collection Time: 04/16/15  5:49 AM  Result Value Ref Range Status   MRSA by PCR NEGATIVE NEGATIVE Final    Comment:        The GeneXpert MRSA Assay  (FDA approved for NASAL specimens only), is one component of a comprehensive MRSA colonization surveillance program. It is not intended to diagnose MRSA infection nor to guide or monitor treatment for MRSA infections.      Scheduled Meds: . antiseptic oral rinse  7 mL Mouth Rinse BID  . cefTRIAXone (ROCEPHIN)  IV  1 g Intravenous Q24H  . famotidine (PEPCID) IV  20 mg Intravenous Q24H  . folic acid  1 mg Intravenous Daily  . lactulose  30 g Per Tube QID  . potassium chloride  10 mEq Intravenous Q1 Hr x 3  . rifaximin  550 mg Per Tube BID  . thiamine  100 mg Intravenous Daily   Continuous Infusions: . dextrose 5 % and 0.45% NaCl 100 mL/hr at 04/21/15 1813

## 2015-04-22 NOTE — Progress Notes (Signed)
Patient ID: Vanessa Hoffman, female   DOB: July 09, 1956, 59 y.o.   MRN: [id number] BP 130/56 mmHg  Pulse 87  Temp(Src) 97.6 F (36.4 C) (Axillary)  Resp 20  Ht  (1.626 m)  Wt 78 kg (171 lb 15.3 oz)  BMI 29.50 kg/m2  SpO2 100% Patient is well known to me. There is little if any difference I can discern between today and 2 weeks ago in my office. She needs an operation, but the POA has not been available. This is not an emergency so I must wait for permission.  Alert, not following commands Speech is unintelligible  Moves extremities.

## 2015-04-23 LAB — COMPREHENSIVE METABOLIC PANEL
ALK PHOS: 140 U/L — AB (ref 38–126)
ALT: 102 U/L — AB (ref 14–54)
AST: 234 U/L — AB (ref 15–41)
Albumin: 1.9 g/dL — ABNORMAL LOW (ref 3.5–5.0)
Anion gap: 7 (ref 5–15)
BUN: 13 mg/dL (ref 6–20)
CALCIUM: 7.7 mg/dL — AB (ref 8.9–10.3)
CHLORIDE: 117 mmol/L — AB (ref 101–111)
CO2: 16 mmol/L — ABNORMAL LOW (ref 22–32)
CREATININE: 0.85 mg/dL (ref 0.44–1.00)
GFR calc Af Amer: >60 mL/min (ref >60)
Glucose, Bld: 148 mg/dL — ABNORMAL HIGH (ref 65–99)
Potassium: 3.2 mmol/L — ABNORMAL LOW (ref 3.5–5.1)
Sodium: 140 mmol/L (ref 135–145)
Total Bilirubin: 1.2 mg/dL (ref 0.3–1.2)
Total Protein: 5.8 g/dL — ABNORMAL LOW (ref 6.5–8.1)

## 2015-04-23 LAB — PROTIME-INR
INR: 2.26 — AB (ref 0.00–1.49)
Prothrombin Time: 24.7 seconds — ABNORMAL HIGH (ref 11.6–15.2)

## 2015-04-23 LAB — CBC
HEMATOCRIT: 24.4 % — AB (ref 36.0–46.0)
Hemoglobin: 8.3 g/dL — ABNORMAL LOW (ref 12.0–15.0)
MCH: 31.9 pg (ref 26.0–34.0)
MCHC: 34 g/dL (ref 30.0–36.0)
MCV: 93.8 fL (ref 78.0–100.0)
PLATELETS: 132 10*3/uL — AB (ref 150–400)
RBC: 2.6 MIL/uL — ABNORMAL LOW (ref 3.87–5.11)
RDW: 16.6 % — AB (ref 11.5–15.5)
WBC: 16.4 10*3/uL — ABNORMAL HIGH (ref 4.0–10.5)

## 2015-04-23 LAB — MAGNESIUM: MAGNESIUM: 2.2 mg/dL (ref 1.7–2.4)

## 2015-04-23 LAB — AMMONIA: AMMONIA: 138 umol/L — AB (ref 9–35)

## 2015-04-23 MED ORDER — VITAMIN K1 10 MG/ML IJ SOLN
10.0000 mg | Freq: Once | INTRAMUSCULAR | Status: AC
  Administered 2015-04-23: 10 mg via SUBCUTANEOUS
  Filled 2015-04-23: qty 1

## 2015-04-23 MED ORDER — POTASSIUM CHLORIDE 20 MEQ PO PACK
40.0000 meq | PACK | Freq: Four times a day (QID) | ORAL | Status: AC
  Administered 2015-04-23 (×2): 40 meq via ORAL
  Filled 2015-04-23 (×4): qty 2

## 2015-04-23 MED ORDER — MAGNESIUM SULFATE 2 GM/50ML IV SOLN
2.0000 g | Freq: Once | INTRAVENOUS | Status: AC
  Administered 2015-04-23: 2 g via INTRAVENOUS
  Filled 2015-04-23: qty 50

## 2015-04-23 MED ORDER — PHYTONADIONE 5 MG PO TABS
5.0000 mg | ORAL_TABLET | Freq: Every day | ORAL | Status: DC
  Administered 2015-04-24 – 2015-04-25 (×2): 5 mg via ORAL
  Filled 2015-04-23 (×3): qty 1

## 2015-04-23 NOTE — Progress Notes (Signed)
Patient ID: Vanessa Hoffman, female   DOB: 06-27-1956, 59 y.o.   MRN: [id number] [facility] PROGRESS NOTE  Vanessa Hoffman NWG:[id number] DOB: 09-04-1955 DOA: 04/15/2015 PCP: No primary care provider on file.   Subjective:   Patient is slightly lethargic but awake, slurred speech. Denies any complaints, she is able to answer simple questions and follows simple commands. Still slow.  Brief narrative:    59 year old female from nursing home with past medical hitory of anxiety, depression, fibromyalgia, hypertension. She presented from SNF where she was residing after sustaining skull fracture and subdural hematoma. She presented with worsening mental status changes, lethargy.   Barrier to discharge: Pt is still NPO, has NG tube in place. SLP evaluated the pt today and recommend strict NPO. I think it is reasonable to see if there is any changes in mental status in next 24-48 hours to try to attempt repeat SLP evaluation. If not much improvement would consider palliative care for goals of care.   Assessment/Plan:    Principal Problem: Acute hepatic encephalopathy - Per CT on this admission, little change in right parietal subdural hematoma. Pt also with history of alcohol abuse so her altered mental status could be from hepatic encephalopathy.  - No significant changes in mental status in past 24 hours; will continue to monitor - Ammonia level 96 this morning  - Continue lactulose and rifaximin, if not able to take it orally will need to reinsert the NG tube. - Check INR, patient has recent subdural hematoma.  Subdural hematoma, right -Hs subacute subdural hematoma measures 5 mm over the right cerebral hemisphere with mild right to left midline shift. -Patient seen by neurosurgery and likely to have surgery but not emergent.   Skull base fracture -Patient has nondisplaced fracture through posterior left ovarian extending to the skull base. -Per radiology possibly extending to  the left occipital condyle.  Alcohol abuse - Alcohol level on admission WNL - UDS unremarkable - Patient's mother has confirmed pt drug use history and alcohol abuse   Aspiration risk / Severe protein calorie malnutrition / Failure to thrive in adult  - High risk of aspiration due to altered mental status - Continue aspiration precautions - Continue NG tube - SLP evaluation today - strict NPO for now  Abnormal LFT's  - Likely related to alcoholic hepatitis - Acute hepatitis panel negative  - HIV panel negative - Abdominal ultrasound; moderate ascites  Hypokalemia / Hypomagnesemia  - Likely due to NG tube / GI losses - Supplemented potassium and magnesium  - Follow up magnesium and potassium, had BMP in a.m.  Frequent falls - Secondary to intoxication, altered mental status - PT eval once she is able to participate   Hyperlipidemia - Resume pravastatin once LFTS normal  Anemia of chronic disease / Thrombocytopenia  - Due to bone marrow suppression from alcohol abuse - Hemoglobin stable  - Platelets 101 - Using SCD's for DVT prophylaxis   AKI (acute kidney injury) / Metabolic acidosis  - Likely prerenal, dehydration - Resolved with IV fluid hydration, presented with creatinine of 1.26, creatinine currently 0.8.  UTI Klebsiella pneumonia / Leukocytosis  - Treated with IV Rocephin for 7 days.    DVT Prophylaxis  - SCD's bilaterally    Code Status: Full.  Family Communication:  Family not at the bedside at this time Disposition Plan: needs PT evaluation   IV access:  Peripheral IV  Procedures and diagnostic studies:    Ct Head Wo Contrast 04/20/2015  Little change in size of RIGHT parietal subdural hematoma.  No new intracranial hemorrhage or infarction identified.   Electronically Signed   By: Ulyses Southward M.D.   On: 04/20/2015 19:16   Dg Swallowing Func-speech Pathology 04/22/2015  Moderately severe oral and moderate pharyngeal dysphagia with sensorimotor   deficits and largely impacted by pt's mental status.   Medical Consultants:  Neurology  Other Consultants:  SLP Physical therapy  IAnti-Infectives:   Rocephin 04/15/2015 --> 04/22/2015 Rifaximin     Ohn Bostic A, MD  [facility] Pager [phone]  Time spent in minutes: 25 minutes  If 7PM-7AM, please contact night-coverage [email or web address] Password [email or web address] 04/23/2015, 12:38 PM   LOS: 8 days    HPI/Subjective: No acute overnight events. No respiratory distress.   Objective: Filed Vitals:   04/23/15 0242 04/23/15 0646 04/23/15 0900 04/23/15 1000  BP: 100/60 127/64  118/84  Pulse: 81 90 97   Temp: 98.2 F (36.8 C) 98.3 F (36.8 C) 98.1 F (36.7 C)   TempSrc: Oral Oral Axillary   Resp: 20 20  16   Height:      Weight:      SpO2: 98% 98% 100%     Intake/Output Summary (Last 24 hours) at 04/23/15 1238 Last data filed at 04/23/15 0800  Gross per 24 hour  Intake     50 ml  Output      0 ml  Net     50 ml    Exam:   General:  Pt is alert, not in acute distress; NG tube in place   Cardiovascular: Regular rate and rhythm, S1/S2 appreciated   Respiratory: Clear to auscultation bilaterally, no wheezing, no crackles, no rhonchi  Abdomen: Soft, non tender, non distended, bowel sounds present  Extremities: No edema, pulses DP and PT palpable bilaterally  Neuro: Grossly nonfocal  Data Reviewed: Basic Metabolic Panel:  Recent Labs Lab 04/18/15 0256 04/20/15 0430 04/21/15 1131 04/22/15 0431 04/23/15 0601  NA 143 141 143 143 140  K 3.1* 3.2* 3.4* 2.8* 3.2*  CL 111 115* 118* 117* 117*  CO2 22 17* 19* 18* 16*  GLUCOSE 178* 145* 153* 158* 148*  BUN 15 15 12 11 13   CREATININE 1.07* 0.81 0.81 0.79 0.85  CALCIUM 8.1* 7.7* 8.0* 7.9* 7.7*  MG 1.4* 1.8 1.7 1.6* 2.2  PHOS 1.3*  --   --   --   --    Liver Function Tests:  Recent Labs Lab 04/18/15 0256 04/20/15 0430 04/21/15 1131 04/22/15 0431 04/23/15 0601  AST 194* 214* 236* 272* 234*  ALT 85* 87*  99* 106* 102*  ALKPHOS 103 110 120 126 140*  BILITOT 1.1 1.1 1.1 0.9 1.2  PROT 6.5 5.6* 6.2* 6.1* 5.8*  ALBUMIN 2.3* 2.0* 2.0* 2.0* 1.9*    Recent Labs Lab 04/20/15 0430  LIPASE 16*    Recent Labs Lab 04/19/15 0223 04/20/15 0430 04/21/15 1131 04/22/15 0431 04/23/15 0515  AMMONIA 64* 83* 73* 96* 138*   CBC:  Recent Labs Lab 04/17/15 0232 04/18/15 0256 04/21/15 1131 04/23/15 0601  WBC 10.7* 14.7* 12.7* 16.4*  NEUTROABS 6.5 11.2*  --   --   HGB 9.3* 9.1* 11.4* 8.3*  HCT 27.2* 27.0* 34.4* 24.4*  MCV 92.8 92.2 93.5 93.8  PLT 131* 140* 101* 132*   Cardiac Enzymes: No results for input(s): CKTOTAL, CKMB, CKMBINDEX, TROPONINI in the last 168 hours. BNP: Invalid input(s): POCBNP CBG:  Recent Labs Lab 04/16/15 1657 04/17/15 0834  GLUCAP 115* 143*    Recent  Results (from the past 240 hour(s))  Urine culture     Status: None   Collection Time: 04/15/15  6:12 PM  Result Value Ref Range Status   Specimen Description URINE, RANDOM  Final   Special Requests NONE  Final   Culture >=100,000 COLONIES/mL KLEBSIELLA PNEUMONIAE  Final   Report Status 04/19/2015 FINAL  Final   Organism ID, Bacteria KLEBSIELLA PNEUMONIAE  Final      Susceptibility   Klebsiella pneumoniae - MIC*    AMPICILLIN >=32 RESISTANT Resistant     CEFAZOLIN <=4 SENSITIVE Sensitive     CEFTRIAXONE <=1 SENSITIVE Sensitive     CIPROFLOXACIN <=0.25 SENSITIVE Sensitive     GENTAMICIN <=1 SENSITIVE Sensitive     IMIPENEM <=0.25 SENSITIVE Sensitive     NITROFURANTOIN 128 RESISTANT Resistant     TRIMETH/SULFA <=20 SENSITIVE Sensitive     AMPICILLIN/SULBACTAM 4 SENSITIVE Sensitive     PIP/TAZO <=4 SENSITIVE Sensitive     * >=100,000 COLONIES/mL KLEBSIELLA PNEUMONIAE  Blood culture (routine x 2)     Status: None   Collection Time: 04/15/15  8:31 PM  Result Value Ref Range Status   Specimen Description BLOOD RIGHT ARM  Final   Special Requests BOTTLES DRAWN AEROBIC AND ANAEROBIC 5CC  Final   Culture NO  GROWTH 5 DAYS  Final   Report Status 04/20/2015 FINAL  Final  Blood culture (routine x 2)     Status: None   Collection Time: 04/15/15  8:37 PM  Result Value Ref Range Status   Specimen Description BLOOD RIGHT HAND  Final   Special Requests BOTTLES DRAWN AEROBIC ONLY 4CC  Final   Culture NO GROWTH 5 DAYS  Final   Report Status 04/20/2015 FINAL  Final  MRSA PCR Screening     Status: None   Collection Time: 04/16/15  5:49 AM  Result Value Ref Range Status   MRSA by PCR NEGATIVE NEGATIVE Final    Comment:        The GeneXpert MRSA Assay (FDA approved for NASAL specimens only), is one component of a comprehensive MRSA colonization surveillance program. It is not intended to diagnose MRSA infection nor to guide or monitor treatment for MRSA infections.      Scheduled Meds: . antiseptic oral rinse  7 mL Mouth Rinse BID  . famotidine (PEPCID) IV  20 mg Intravenous Q24H  . folic acid  1 mg Intravenous Daily  . lactulose  30 g Per Tube QID  . rifaximin  550 mg Per Tube BID  . thiamine  100 mg Intravenous Daily   Continuous Infusions: . dextrose 5 % and 0.45% NaCl 100 mL/hr at 04/23/15 0800

## 2015-04-23 NOTE — Progress Notes (Signed)
Physical Therapy Evaluation Patient Details Name: Vanessa Hoffman MRN: [id number] DOB: [birth year] Today's Date: 04/23/2015   History of Present Illness  [name] is a 59 y.o.WF Fm Local nursing home PMHx Anxiety, Fibromyalgia, Depression, Alcohol Abuse, tobacco abuse, HTN, HLD, DM Type 2 without complication. Pt was at hospital in july s/p fall with nondisplaced skull fracture and SDH. Pt came from local SNF due to further AMS.   Clinical Impression  Pt admitted with above. Pt with minimal commands following and active participation in PT session this date. Pt with significant UE edema and multiple skin tears t/o UEs. RN made aware. Pt requires assist x2 for all transfers and is unsafe for OOB mobility at this time. Acute PT to con't to follow to progress as able.    Follow Up Recommendations SNF    Equipment Recommendations  None recommended by PT    Recommendations for Other Services       Precautions / Restrictions Precautions Precautions: Fall Precaution Comments: bilat UE edema Restrictions Weight Bearing Restrictions: No      Mobility  Bed Mobility Overal bed mobility: Needs Assistance Bed Mobility: Supine to Sit;Sit to Supine Rolling: Max assist   Supine to sit: Max assist;+2 for physical assistance Sit to supine: Max assist;+2 for physical assistance   General bed mobility comments: pt unable to sequence, required maxA for trunk elevation and LE managment  Transfers                 General transfer comment: unsafe at this time due to severe retropulsion  Ambulation/Gait             General Gait Details: unsafe at this time  Stairs            Wheelchair Mobility    Modified Rankin (Stroke Patients Only)       Balance Overall balance assessment: Needs assistance Sitting-balance support: Feet supported;Bilateral upper extremity supported Sitting balance-Leahy Scale: Poor Sitting balance - Comments: pt with 4 episodes of  resistive retropulsion. however pt when relaxed could also tolerate sitting x 1 min with min guard Postural control: Posterior lean                                   Pertinent Vitals/Pain Pain Assessment: Faces Faces Pain Scale: Hurts even more Pain Location: L UE mvmt    Home Living Family/patient expects to be discharged to:: Skilled nursing facility                      Prior Function Level of Independence: Needs assistance   Gait / Transfers Assistance Needed: unsure if pt was ambulating since july  ADL's / Homemaking Assistance Needed: assist from SNF  Comments: pt poor historian and unable to provide PLOF     Hand Dominance   Dominant Hand: Right    Extremity/Trunk Assessment   Upper Extremity Assessment: RUE deficits/detail;LUE deficits/detail;Difficult to assess due to impaired cognition RUE Deficits / Details: grossly 3/5     LUE Deficits / Details: grossly 2/5 except grip 1/5   Lower Extremity Assessment: Difficult to assess due to impaired cognition (grossly 3-/5)      Cervical / Trunk Assessment: Normal  Communication   Communication: No difficulties  Cognition Arousal/Alertness: Lethargic Behavior During Therapy: Flat affect Overall Cognitive Status: No family/caregiver present to determine baseline cognitive functioning Area of Impairment: Orientation;Attention;Memory;Following commands;Safety/judgement;Awareness;Problem solving Orientation Level: Disoriented  to;Place;Time;Situation (reports she is in [place]) Current Attention Level: Focused Memory: Decreased short-term memory Following Commands: Follows one step commands inconsistently (approx 50% of time) Safety/Judgement: Decreased awareness of safety;Decreased awareness of deficits Awareness: Intellectual Problem Solving: Slow processing;Decreased initiation;Difficulty sequencing;Requires verbal cues;Requires tactile cues General Comments: pt easily agitated during  mvmt, unable to follow commands with L UE but impulsively resists with L UE when PT attempts to range pt    General Comments General comments (skin integrity, edema, etc.): pt with noted +3 pitting edema t/o bilat UEs. PT removed mitt from L UE and asked RN to assess. pt not voluntarily moving L UE and unsure why mitt was on in first place. noted skin tear where mitt was fastened. pt also with tight bandage at L elbow also causing problems with edema    Exercises        Assessment/Plan    PT Assessment Patient needs continued PT services  PT Diagnosis Difficulty walking;Generalized weakness;Acute pain;Altered mental status   PT Problem List Decreased strength;Decreased activity tolerance;Decreased balance;Decreased mobility;Decreased coordination;Decreased cognition;Decreased knowledge of use of DME;Decreased safety awareness;Pain  PT Treatment Interventions DME instruction;Gait training;Functional mobility training;Therapeutic activities;Therapeutic exercise;Balance training;Neuromuscular re-education;Cognitive remediation;Patient/family education;Modalities   PT Goals (Current goals can be found in the Care Plan section) Acute Rehab PT Goals Patient Stated Goal: did not state  PT Goal Formulation: Patient unable to participate in goal setting Time For Goal Achievement: 05/07/15 Potential to Achieve Goals: Good    Frequency Min 2X/week   Barriers to discharge        Co-evaluation               End of Session   Activity Tolerance: Patient limited by lethargy Patient left: in bed;with call bell/phone within reach Nurse Communication: Mobility status         Time: 2130-8657 PT Time Calculation (min) (ACUTE ONLY): 26 min   Charges:   PT Evaluation $Initial PT Evaluation Tier I: 1 Procedure PT Treatments $Therapeutic Activity: 8-22 mins   PT G CodesMarcene Brawn 04/23/2015, 12:28 PM   Lewis Shock, PT, DPT Pager #: [phone] Office #:  [phone]

## 2015-04-23 NOTE — Progress Notes (Addendum)
Speech Language Pathology Treatment: Dysphagia  Patient Details Name: Vanessa Hoffman MRN: [id number] DOB: [birth year] Today's Date: 04/23/2015 Time: 4098-1191 SLP Time Calculation (min) (ACUTE ONLY): 27 min  Assessment / Plan / Recommendation Clinical Impression  Pt presents with improved function today - including following directions (approx 75% accuracy) and speech intelligibility (approx 40% intelligible, increasing to 70% for short phrase level with max cues to slow rate).  SLP provided pt with oral care via toothette with water- immediate cough noted with oral care - suspect aspiration of water.  Pt given yogurt and honey thick liquids via tsp.  Pt continues with decreased attention resulting in delayed oral transiting and suspected delayed pharyngeal swallow response.  Multiple swallows noted - suspect due to piece-mealing.  No indication of aspiration with tsp honey and yogurt, however mentation continues to be a barrier for safe intake.  Given pt continues to demonstrate improvement, hopeful for pt to be appropriate for po diet within 24 hours.  SLP to continue to follow for po readiness, dysphagia treatment. Informed pt to recommendations but cognitive deficit likely impacts pt's understanding.       HPI Other Pertinent Information: Pt is a 59 y.o.F SNF resident w/ a Hx Anxiety, Fibromyalgia, Depression, Alcohol Abuse, vascular dementia, tobacco abuse, HTN, HLD, DM2, and a recent skkull fx w/ subdural hematoma who presented to the ED w/ altered mental status. The patient is typically able to speak and have a conversation. The patient was reportedly found in an altered state, not responding. To the nursing home knowledge, there hadn't been any history of trauma, fever, nausea, emesis, diarrhea or any other symptoms. Her family stated her appetite and oral intake had been decreased for a few days. Pt has been recieving temporary nutrition via NG tube, previously not alert for PO.      Pertinent Vitals Pain Assessment: Faces Pain Score: 2  Faces Pain Scale: Hurts a little bit Pain Location: back  Pain Descriptors / Indicators: Other (Comment) (unable to describe) Pain Intervention(s): Repositioned  SLP Plan  Continue with current plan of care    Recommendations Diet recommendations: NPO (except medications with puree/yogurt/applesauce)- ICE chips  Liquids provided via: Teaspoon Medication Administration: Crushed with puree Supervision: Patient able to self feed;Trained caregiver to feed patient (informed RN student to precautions and recommendations) Postural Changes and/or Swallow Maneuvers: Seated upright 90 degrees;Upright 30-60 min after meal              Oral Care Recommendations: Oral care QID Plan: Continue with current plan of care    GO     Vanessa Burnet, MS [facility] SLP [phone]

## 2015-04-24 LAB — COMPREHENSIVE METABOLIC PANEL
ALBUMIN: 1.9 g/dL — AB (ref 3.5–5.0)
ALK PHOS: 161 U/L — AB (ref 38–126)
ALT: 108 U/L — AB (ref 14–54)
AST: 286 U/L — AB (ref 15–41)
Anion gap: 8 (ref 5–15)
BILIRUBIN TOTAL: 2.1 mg/dL — AB (ref 0.3–1.2)
BUN: 14 mg/dL (ref 6–20)
CALCIUM: 8 mg/dL — AB (ref 8.9–10.3)
CO2: 17 mmol/L — ABNORMAL LOW (ref 22–32)
Chloride: 119 mmol/L — ABNORMAL HIGH (ref 101–111)
Creatinine, Ser: 0.88 mg/dL (ref 0.44–1.00)
GFR calc Af Amer: >60 mL/min (ref >60)
GFR calc non Af Amer: >60 mL/min (ref >60)
GLUCOSE: 121 mg/dL — AB (ref 65–99)
Potassium: 4.1 mmol/L (ref 3.5–5.1)
Sodium: 144 mmol/L (ref 135–145)
TOTAL PROTEIN: 5.7 g/dL — AB (ref 6.5–8.1)

## 2015-04-24 LAB — AMMONIA: Ammonia: 69 umol/L — ABNORMAL HIGH (ref 9–35)

## 2015-04-24 LAB — MAGNESIUM: Magnesium: 2.5 mg/dL — ABNORMAL HIGH (ref 1.7–2.4)

## 2015-04-24 LAB — PROTIME-INR
INR: 1.92 — ABNORMAL HIGH (ref 0.00–1.49)
Prothrombin Time: 21.9 seconds — ABNORMAL HIGH (ref 11.6–15.2)

## 2015-04-24 MED ORDER — FOLIC ACID 1 MG PO TABS
1.0000 mg | ORAL_TABLET | Freq: Every day | ORAL | Status: DC
  Administered 2015-04-24 – 2015-05-01 (×8): 1 mg via ORAL
  Filled 2015-04-24 (×9): qty 1

## 2015-04-24 MED ORDER — FAMOTIDINE 40 MG/5ML PO SUSR
20.0000 mg | Freq: Every day | ORAL | Status: DC
  Administered 2015-04-24 – 2015-04-30 (×7): 20 mg via ORAL
  Filled 2015-04-24 (×8): qty 2.5

## 2015-04-24 MED ORDER — SACCHAROMYCES BOULARDII 250 MG PO CAPS
250.0000 mg | ORAL_CAPSULE | Freq: Two times a day (BID) | ORAL | Status: DC
  Administered 2015-04-24 – 2015-05-01 (×15): 250 mg via ORAL
  Filled 2015-04-24 (×15): qty 1

## 2015-04-24 MED ORDER — SPIRONOLACTONE 25 MG PO TABS
100.0000 mg | ORAL_TABLET | Freq: Every day | ORAL | Status: DC
  Administered 2015-04-24 – 2015-04-26 (×3): 100 mg via ORAL
  Filled 2015-04-24 (×3): qty 4

## 2015-04-24 MED ORDER — FUROSEMIDE 40 MG PO TABS
40.0000 mg | ORAL_TABLET | Freq: Every day | ORAL | Status: DC
  Administered 2015-04-24 – 2015-04-25 (×2): 40 mg via ORAL
  Filled 2015-04-24 (×2): qty 1

## 2015-04-24 MED ORDER — ENSURE ENLIVE PO LIQD
237.0000 mL | Freq: Two times a day (BID) | ORAL | Status: DC
Start: 2015-04-24 — End: 2015-04-25
  Administered 2015-04-24 – 2015-04-25 (×2): 237 mL via ORAL
  Filled 2015-04-24 (×4): qty 237

## 2015-04-24 MED ORDER — RESOURCE THICKENUP CLEAR PO POWD
ORAL | Status: DC | PRN
  Filled 2015-04-24: qty 125

## 2015-04-24 MED ORDER — VITAMIN B-1 100 MG PO TABS
100.0000 mg | ORAL_TABLET | Freq: Every day | ORAL | Status: DC
  Administered 2015-04-24 – 2015-04-30 (×7): 100 mg via ORAL
  Filled 2015-04-24 (×7): qty 1

## 2015-04-24 NOTE — Progress Notes (Signed)
Nutrition Follow-up  DOCUMENTATION CODES:   Obesity unspecified  INTERVENTION:  Recommend monitoring magnesium, potassium, and phosphorus daily for at least 3 days, MD to replete as needed, as pt is at risk for refeeding syndrome given NPO for >/=7 days.  Provide Ensure Enlive po BID, each supplement provides 350 kcal and 20 grams of protein  Monitor PO intake for adequacy  NUTRITION DIAGNOSIS:   Predicted suboptimal nutrient intake related to dysphagia as evidenced by meal completion < 50%.   GOAL:   Patient will meet greater than or equal to 90% of their needs  Unmet  MONITOR:   PO intake, Supplement acceptance, Labs, Weight trends, Skin  REASON FOR ASSESSMENT:   Low Braden    ASSESSMENT:   59 y.o. Female transferred from nursing home, where she was discharged following a skull fracture/subdural hematoma early last month. There is not much information available, but apparently the patient is typically able to speak and have a conversation. Today in the afternoon patient was found altered and not responding. To the nursing home knowledge, there hasn't been any history of trauma, fever, nausea, emesis, diarrhea or any other symptoms. Her parents state that her appetite and oral intake have been decreased for the past few days.  Pt remained NPO from 8/23 until this morning; pt was re-assessed by SLP today and approved for Dysphagia 1 diet with nectar-thick liquids. Family at bedside state pt ate an orange cream magic cup at breakfast but, nothing else. At time of visit, pt states she is not hungry. RD provided Ensure Enlive which pt took several sips of and seemed to enjoy. Unable to obtain weight- bed scale not accurate.   Labs: low calcium, high chloride, elevated AST, ALT, and alk phos  Diet Order:  DIET - DYS 1 Room service appropriate?: Yes; Fluid consistency:: Nectar Thick  Skin:  Wound (see comment) (MASD on sacrum)  Last BM:  8/30  Height:   Ht Readings from  Last 1 Encounters:  04/16/15 5' 4"  (1.626 m)    Weight:   Wt Readings from Last 1 Encounters:  04/16/15 171 lb 15.3 oz (78 kg)    Ideal Body Weight:  54.5 kg  BMI:  Body mass index is 29.5 kg/(m^2).  Estimated Nutritional Needs:   Kcal:  1800-2000  Protein:  90-100 gm  Fluid:  1.8-2.0 L  EDUCATION NEEDS:   No education needs identified at this time  C[place]on [place], LDN Inpatient Clinical Dietitian Pager: [phone] After Hours Pager: [phone]

## 2015-04-24 NOTE — Progress Notes (Addendum)
Husband states he has a new phone number [phone]. He also states that someone called him a couple of days ago and he told the person that he would transport his wife back to [facility].  States that he will be unable to get her into his truck--he has a [employer].  Called Dysheka and let her know.

## 2015-04-24 NOTE — Progress Notes (Signed)
Patient ID: MYSTIC LABO, female   DOB: 1956/06/24, 59 y.o.   MRN: [id number] [facility] PROGRESS NOTE  SHAMIYA DEMERITT QMV:[id number] DOB: Feb 08, 1956 DOA: 04/15/2015 PCP: No primary care provider on file.   Subjective:   Patient is more awake today, still has some slurred speech. Seen by SLP and recommended dysphagia 1 with nectar thick liquids. INR was found to be 2.2 yesterday  Brief narrative:    59 year old female from nursing home with past medical hitory of anxiety, depression, fibromyalgia, hypertension. She presented from SNF where she was residing after sustaining skull fracture and subdural hematoma. She presented with worsening mental status changes, lethargy.   Barrier to discharge: Lethargy, dysphagia  Assessment/Plan:    Principal Problem: Acute hepatic encephalopathy - Per CT on this admission, little change in right parietal subdural hematoma. Pt also with history of alcohol abuse so her altered mental status could be from hepatic encephalopathy.  - No significant changes in mental status in past 24 hours; will continue to monitor - Ammonia level 96 this morning  - Continue lactulose and rifaximin, if not able to take it orally will need to reinsert the NG tube. - INR is 2.2, baseline from 2012 was 0.9. Given vitamin K, not sure if it's going to work.  Subdural hematoma, right -Hs subacute subdural hematoma measures 5 mm over the right cerebral hemisphere with mild right to left midline shift. -Patient seen by neurosurgery and likely to have surgery but not emergent.   Skull base fracture -Patient has nondisplaced fracture through posterior left ovarian extending to the skull base. -Per radiology possibly extending to the left occipital condyle.  Alcohol abuse - Alcohol level on admission WNL - UDS unremarkable - Patient's mother has confirmed pt drug use history and alcohol abuse   Aspiration risk / Severe protein calorie malnutrition / Failure  to thrive in adult  - High risk of aspiration due to altered mental status - Continue aspiration precautions - Continue NG tube - SLP evaluation today - strict NPO for now  Chronic liver disease, clinically consistent with cirrhosis versus fatty liver  - Has transaminitis with AST/ALT ratio greater than 2/1 consistent with alcoholic consumption. - Patient has hypoalbuminemia, thrombocytopenia and hyperammonemia - Hepatitis panels negative, HIV is negative as well. - Abdominal ultrasound; moderate ascites, liver texture consistent with fatty liver.  Hypokalemia / Hypomagnesemia  - Likely due to loose stools from the lactulose. - Supplemented potassium and magnesium  - Follow up magnesium and potassium, had BMP in a.m.  Frequent falls - Secondary to intoxication, altered mental status - PT eval once she is able to participate   Hyperlipidemia - Resume pravastatin once LFTS normal  Anemia of chronic disease / Thrombocytopenia  - Unclear if it's secondary to alcohol versus chronic liver disease. - Hemoglobin stable  - Platelets 101 - Using SCD's for DVT prophylaxis   AKI (acute kidney injury) / Metabolic acidosis  - Likely prerenal, dehydration - Resolved with IV fluid hydration, presented with creatinine of 1.26, creatinine currently 0.8.  UTI Klebsiella pneumonia / Leukocytosis  - Treated with IV Rocephin for 7 days.    DVT Prophylaxis  - SCD's bilaterally    Code Status: Full.  Family Communication:  Family not at the bedside at this time Disposition Plan: needs PT evaluation   IV access:  Peripheral IV  Procedures and diagnostic studies:    Ct Head Wo Contrast 04/20/2015  Little change in size of RIGHT parietal subdural hematoma.  No new intracranial hemorrhage or infarction identified.   Electronically Signed   By: Ulyses Southward M.D.   On: 04/20/2015 19:16   Dg Swallowing Func-speech Pathology 04/22/2015  Moderately severe oral and moderate pharyngeal dysphagia with  sensorimotor  deficits and largely impacted by pt's mental status.   Medical Consultants:  Neurology  Other Consultants:  SLP Physical therapy  IAnti-Infectives:   Rocephin 04/15/2015 --> 04/22/2015 Rifaximin     Blessing Zaucha A, MD  [facility] Pager [phone]  Time spent in minutes: 25 minutes  If 7PM-7AM, please contact night-coverage [email or web address] Password [facility] 04/24/2015, 12:26 PM   LOS: 9 days    HPI/Subjective: No acute overnight events. No respiratory distress.   Objective: Filed Vitals:   04/23/15 2200 04/24/15 0119 04/24/15 0548 04/24/15 1027  BP: 106/52 102/57 124/78 133/77  Pulse: 98 94 93 93  Temp: 98.3 F (36.8 C) 97.6 F (36.4 C) 98.2 F (36.8 C) 98.6 F (37 C)  TempSrc: Axillary Axillary Axillary Oral  Resp: 20 18 18 18   Height:      Weight:      SpO2: 98% 99% 100% 98%    Intake/Output Summary (Last 24 hours) at 04/24/15 1226 Last data filed at 04/23/15 2126  Gross per 24 hour  Intake      0 ml  Output   1000 ml  Net  -1000 ml    Exam:   General:  Pt is alert, not in acute distress; NG tube in place   Cardiovascular: Regular rate and rhythm, S1/S2 appreciated   Respiratory: Clear to auscultation bilaterally, no wheezing, no crackles, no rhonchi  Abdomen: Soft, non tender, non distended, bowel sounds present  Extremities: No edema, pulses DP and PT palpable bilaterally  Neuro: Grossly nonfocal  Data Reviewed: Basic Metabolic Panel:  Recent Labs Lab 04/18/15 0256 04/20/15 0430 04/21/15 1131 04/22/15 0431 04/23/15 0601 04/24/15 0630  NA 143 141 143 143 140 144  K 3.1* 3.2* 3.4* 2.8* 3.2* 4.1  CL 111 115* 118* 117* 117* 119*  CO2 22 17* 19* 18* 16* 17*  GLUCOSE 178* 145* 153* 158* 148* 121*  BUN 15 15 12 11 13 14   CREATININE 1.07* 0.81 0.81 0.79 0.85 0.88  CALCIUM 8.1* 7.7* 8.0* 7.9* 7.7* 8.0*  MG 1.4* 1.8 1.7 1.6* 2.2 2.5*  PHOS 1.3*  --   --   --   --   --    Liver Function Tests:  Recent Labs Lab  04/20/15 0430 04/21/15 1131 04/22/15 0431 04/23/15 0601 04/24/15 0630  AST 214* 236* 272* 234* 286*  ALT 87* 99* 106* 102* 108*  ALKPHOS 110 120 126 140* 161*  BILITOT 1.1 1.1 0.9 1.2 2.1*  PROT 5.6* 6.2* 6.1* 5.8* 5.7*  ALBUMIN 2.0* 2.0* 2.0* 1.9* 1.9*    Recent Labs Lab 04/20/15 0430  LIPASE 16*    Recent Labs Lab 04/20/15 0430 04/21/15 1131 04/22/15 0431 04/23/15 0515 04/24/15 0630  AMMONIA 83* 73* 96* 138* 69*   CBC:  Recent Labs Lab 04/18/15 0256 04/21/15 1131 04/23/15 0601  WBC 14.7* 12.7* 16.4*  NEUTROABS 11.2*  --   --   HGB 9.1* 11.4* 8.3*  HCT 27.0* 34.4* 24.4*  MCV 92.2 93.5 93.8  PLT 140* 101* 132*   Cardiac Enzymes: No results for input(s): CKTOTAL, CKMB, CKMBINDEX, TROPONINI in the last 168 hours. BNP: Invalid input(s): POCBNP CBG: No results for input(s): GLUCAP in the last 168 hours.  Recent Results (from the past 240 hour(s))  Urine culture  Status: None   Collection Time: 04/15/15  6:12 PM  Result Value Ref Range Status   Specimen Description URINE, RANDOM  Final   Special Requests NONE  Final   Culture >=100,000 COLONIES/mL KLEBSIELLA PNEUMONIAE  Final   Report Status 04/19/2015 FINAL  Final   Organism ID, Bacteria KLEBSIELLA PNEUMONIAE  Final      Susceptibility   Klebsiella pneumoniae - MIC*    AMPICILLIN >=32 RESISTANT Resistant     CEFAZOLIN <=4 SENSITIVE Sensitive     CEFTRIAXONE <=1 SENSITIVE Sensitive     CIPROFLOXACIN <=0.25 SENSITIVE Sensitive     GENTAMICIN <=1 SENSITIVE Sensitive     IMIPENEM <=0.25 SENSITIVE Sensitive     NITROFURANTOIN 128 RESISTANT Resistant     TRIMETH/SULFA <=20 SENSITIVE Sensitive     AMPICILLIN/SULBACTAM 4 SENSITIVE Sensitive     PIP/TAZO <=4 SENSITIVE Sensitive     * >=100,000 COLONIES/mL KLEBSIELLA PNEUMONIAE  Blood culture (routine x 2)     Status: None   Collection Time: 04/15/15  8:31 PM  Result Value Ref Range Status   Specimen Description BLOOD RIGHT ARM  Final   Special  Requests BOTTLES DRAWN AEROBIC AND ANAEROBIC 5CC  Final   Culture NO GROWTH 5 DAYS  Final   Report Status 04/20/2015 FINAL  Final  Blood culture (routine x 2)     Status: None   Collection Time: 04/15/15  8:37 PM  Result Value Ref Range Status   Specimen Description BLOOD RIGHT HAND  Final   Special Requests BOTTLES DRAWN AEROBIC ONLY 4CC  Final   Culture NO GROWTH 5 DAYS  Final   Report Status 04/20/2015 FINAL  Final  MRSA PCR Screening     Status: None   Collection Time: 04/16/15  5:49 AM  Result Value Ref Range Status   MRSA by PCR NEGATIVE NEGATIVE Final    Comment:        The GeneXpert MRSA Assay (FDA approved for NASAL specimens only), is one component of a comprehensive MRSA colonization surveillance program. It is not intended to diagnose MRSA infection nor to guide or monitor treatment for MRSA infections.      Scheduled Meds: . antiseptic oral rinse  7 mL Mouth Rinse BID  . famotidine  20 mg Oral Daily  . folic acid  1 mg Oral Daily  . lactulose  30 g Per Tube QID  . phytonadione  5 mg Oral Daily  . rifaximin  550 mg Per Tube BID  . thiamine  100 mg Oral Daily   Continuous Infusions: . dextrose 5 % and 0.45% NaCl 10 mL/hr at 04/23/15 1517

## 2015-04-24 NOTE — Progress Notes (Signed)
Speech Language Pathology Treatment: Dysphagia  Patient Details Name: Vanessa Hoffman MRN: [id number] DOB: Jan 17, 1956 Today's Date: 04/24/2015 Time: 0805-0828 SLP Time Calculation (min) (ACUTE ONLY): 23 min  Assessment / Plan / Recommendation Clinical Impression  Pt demonstrating much improved mental status and swallow ability today!  Episodes of intelligible speech noted today!  She remains very easily distractible but is able to focus in quiet environment.  Oral care provided - SLP administered cracker, yogurt, applesauce and nectar thick juice to pt.  Delayed swallow response and multiple swallows noted but no symptoms of airway compromise. Suspect multiple swallows due to piecemealing. Pt with slow mastication - with delay greater than 60 seconds to elicit swallow, therefore do not recommend solids requiring mastication at this time.    Recommend dys1/nectar, allowing ice chips with strict aspiration precautions.  Will follow up for readiness for dietary advancement.  Spoke to MD and order obtained, thank you!  RN informed and swallow precaution signs posted in room.        HPI Other Pertinent Information: Pt is a 59 y.o.F SNF resident w/ a Hx Anxiety, Fibromyalgia, Depression, Alcohol Abuse, vascular dementia, tobacco abuse, HTN, HLD, DM2, and a recent skkull fx w/ subdural hematoma who presented to the ED w/ altered mental status. The patient is typically able to speak and have a conversation. The patient was reportedly found in an altered state, not responding. To the nursing home knowledge, there hadn't been any history of trauma, fever, nausea, emesis, diarrhea or any other symptoms. Her family stated her appetite and oral intake had been decreased for a few days. Pt has been recieving temporary nutrition via NG tube, previously not alert for PO.     Pertinent Vitals Pain Assessment: Faces Pain Score: 0-No pain  SLP Plan  Continue with current plan of care    Recommendations Diet  recommendations: Dysphagia 1 (puree);Nectar-thick liquid Liquids provided via: Cup;Straw Medication Administration: Crushed with puree Supervision: Full supervision/cueing for compensatory strategies Compensations: Slow rate;Small sips/bites (allow pt time for extra swallows) Postural Changes and/or Swallow Maneuvers: Seated upright 90 degrees;Upright 30-60 min after meal              Oral Care Recommendations: Oral care before and after PO Follow up Recommendations: Skilled Nursing facility Plan: Continue with current plan of care    GO     Mills Koller, MS [facility] SLP [phone]

## 2015-04-25 ENCOUNTER — Inpatient Hospital Stay (HOSPITAL_COMMUNITY): Payer: Medicare Other

## 2015-04-25 LAB — COMPREHENSIVE METABOLIC PANEL
ALBUMIN: 2 g/dL — AB (ref 3.5–5.0)
ALT: 111 U/L — AB (ref 14–54)
AST: 294 U/L — AB (ref 15–41)
Alkaline Phosphatase: 174 U/L — ABNORMAL HIGH (ref 38–126)
Anion gap: 9 (ref 5–15)
BILIRUBIN TOTAL: 1.6 mg/dL — AB (ref 0.3–1.2)
BUN: 14 mg/dL (ref 6–20)
CHLORIDE: 125 mmol/L — AB (ref 101–111)
CO2: 17 mmol/L — ABNORMAL LOW (ref 22–32)
CREATININE: 0.96 mg/dL (ref 0.44–1.00)
Calcium: 8.5 mg/dL — ABNORMAL LOW (ref 8.9–10.3)
GFR calc Af Amer: >60 mL/min (ref >60)
GFR calc non Af Amer: >60 mL/min (ref >60)
GLUCOSE: 125 mg/dL — AB (ref 65–99)
POTASSIUM: 3.5 mmol/L (ref 3.5–5.1)
Sodium: 151 mmol/L — ABNORMAL HIGH (ref 135–145)
Total Protein: 5.9 g/dL — ABNORMAL LOW (ref 6.5–8.1)

## 2015-04-25 LAB — MAGNESIUM: MAGNESIUM: 2.2 mg/dL (ref 1.7–2.4)

## 2015-04-25 LAB — AMMONIA: AMMONIA: 48 umol/L — AB (ref 9–35)

## 2015-04-25 LAB — PROTIME-INR
INR: 1.45 (ref 0.00–1.49)
Prothrombin Time: 17.7 seconds — ABNORMAL HIGH (ref 11.6–15.2)

## 2015-04-25 MED ORDER — ONDANSETRON HCL 4 MG/2ML IJ SOLN
4.0000 mg | Freq: Four times a day (QID) | INTRAMUSCULAR | Status: DC | PRN
  Administered 2015-04-25: 4 mg via INTRAVENOUS
  Filled 2015-04-25: qty 2

## 2015-04-25 MED ORDER — LORAZEPAM 2 MG/ML IJ SOLN
0.5000 mg | Freq: Once | INTRAMUSCULAR | Status: AC
  Administered 2015-04-26: 0.5 mg via INTRAVENOUS
  Filled 2015-04-25: qty 1

## 2015-04-25 MED ORDER — POTASSIUM CHLORIDE CRYS ER 20 MEQ PO TBCR
60.0000 meq | EXTENDED_RELEASE_TABLET | Freq: Once | ORAL | Status: AC
  Administered 2015-04-25: 60 meq via ORAL
  Filled 2015-04-25: qty 3

## 2015-04-25 MED ORDER — ENSURE ENLIVE PO LIQD
237.0000 mL | Freq: Three times a day (TID) | ORAL | Status: DC
  Administered 2015-04-25 – 2015-04-29 (×6): 237 mL via ORAL
  Filled 2015-04-25 (×19): qty 237

## 2015-04-25 MED ORDER — PROMETHAZINE HCL 25 MG/ML IJ SOLN
12.5000 mg | Freq: Once | INTRAMUSCULAR | Status: AC
  Administered 2015-04-25: 12.5 mg via INTRAVENOUS
  Filled 2015-04-25: qty 1

## 2015-04-25 NOTE — Care Management Important Message (Signed)
Important Message  Patient Details  Name: Vanessa Hoffman MRN: [id number] Date of Birth: 1955/11/18   Medicare Important Message Given:  Yes-third notification given    Orson Aloe 04/25/2015, 12:23 PM

## 2015-04-25 NOTE — Progress Notes (Signed)
Patient ID: Vanessa Hoffman, female   DOB: Jan 13, 1956, 59 y.o.   MRN: [id number] [facility] PROGRESS NOTE  Vanessa Hoffman FAO:[id number] DOB: 12-01-1955 DOA: 04/15/2015 PCP: No primary care provider on file.   Subjective:   Slow but consistent improvement, seen eating this morning with the help of a nurse technician. Still has slurred speech, overall improving.  Brief narrative:    59 year old female from nursing home with past medical hitory of anxiety, depression, fibromyalgia, hypertension. She presented from SNF where she was residing after sustaining skull fracture and subdural hematoma. She presented with worsening mental status changes, lethargy.   Barrier to discharge: Lethargy, dysphagia   Assessment/Plan:    Principal Problem: Acute hepatic encephalopathy - Per CT on this admission, little change in right parietal subdural hematoma. Pt also with history of alcohol abuse so her altered mental status could be from hepatic encephalopathy.  -Hypokalemia corrected, hypokalemia increases renal ammonium production. - Ammonia level reached a peak of 138, 48 this morning. -Patient getting her lactulose and rifaximin, improving, continue current medications.  Subdural hematoma, right -Hs subacute subdural hematoma measures 5 mm over the right cerebral hemisphere with mild right to left midline shift. -Patient seen by neurosurgery and likely to have surgery but not emergent.   Skull base fracture -Patient has nondisplaced fracture through posterior left ovarian extending to the skull base. -Per radiology possibly extending to the left occipital condyle.  Alcohol abuse - Alcohol level on admission WNL - UDS unremarkable - Patient's mother has confirmed pt drug use history and alcohol abuse   Aspiration risk / Severe protein calorie malnutrition / Failure to thrive in adult  - High risk of aspiration due to altered mental status - Continue aspiration precautions -  Continue NG tube - SLP recommended dysphagia 1 with nectar thick liquids.  Chronic liver disease, clinically consistent with cirrhosis versus fatty liver  - Has transaminitis with AST/ALT ratio greater than 2/1 consistent with alcoholic consumption. - Patient has hypoalbuminemia, thrombocytopenia and hyperammonemia - Hepatitis panels negative, HIV is negative as well. - Abdominal ultrasound; moderate ascites, liver texture consistent with fatty liver.  Hypokalemia / Hypomagnesemia  - Likely due to loose stools from the lactulose. - Supplemented potassium and magnesium  - Follow up magnesium and potassium, had BMP in a.m.  Frequent falls - Secondary to intoxication, altered mental status - PT eval once she is able to participate   Hyperlipidemia - Resume pravastatin once LFTS normal  Anemia of chronic disease / Thrombocytopenia  - Unclear if it's secondary to alcohol versus chronic liver disease. - Hemoglobin stable  - Platelets 101 - Using SCD's for DVT prophylaxis   AKI (acute kidney injury) / Metabolic acidosis  - Likely prerenal, dehydration - Resolved with IV fluid hydration, presented with creatinine of 1.26, creatinine currently 0.8.  UTI Klebsiella pneumonia / Leukocytosis  - Treated with IV Rocephin for 7 days.    DVT Prophylaxis  - SCD's bilaterally    Code Status: Full.  Family Communication:  Family not at the bedside at this time Disposition Plan: needs PT evaluation   IV access:  Peripheral IV  Procedures and diagnostic studies:    Ct Head Wo Contrast 04/20/2015  Little change in size of RIGHT parietal subdural hematoma.  No new intracranial hemorrhage or infarction identified.   Electronically Signed   By: Ulyses Southward M.D.   On: 04/20/2015 19:16   Dg Swallowing Func-speech Pathology 04/22/2015  Moderately severe oral and moderate pharyngeal dysphagia  with sensorimotor  deficits and largely impacted by pt's mental status.   Medical Consultants:   Neurology  Other Consultants:  SLP Physical therapy  IAnti-Infectives:   Rocephin 04/15/2015 --> 04/22/2015 Rifaximin     Vanessa Hoffman A, MD  [facility] Pager [phone]  Time spent in minutes: 25 minutes  If 7PM-7AM, please contact night-coverage [email or web address] Password [email or web address] 04/25/2015, 12:56 PM   LOS: 10 days    HPI/Subjective: No acute overnight events. No respiratory distress.   Objective: Filed Vitals:   04/24/15 2145 04/25/15 0147 04/25/15 0551 04/25/15 1026  BP: 125/70 117/19 102/34 135/43  Pulse: 94 101 99 99  Temp: 98.2 F (36.8 C) 98.5 F (36.9 C) 97.7 F (36.5 C) 98.3 F (36.8 C)  TempSrc: Oral Oral Axillary Axillary  Resp: 20 20 20 21   Height:      Weight:      SpO2: 100% 97% 97% 98%    Intake/Output Summary (Last 24 hours) at 04/25/15 1256 Last data filed at 04/25/15 1040  Gross per 24 hour  Intake      0 ml  Output   1300 ml  Net  -1300 ml    Exam:   General:  Pt is alert, not in acute distress; NG tube in place   Cardiovascular: Regular rate and rhythm, S1/S2 appreciated   Respiratory: Clear to auscultation bilaterally, no wheezing, no crackles, no rhonchi  Abdomen: Soft, non tender, non distended, bowel sounds present  Extremities: No edema, pulses DP and PT palpable bilaterally  Neuro: Grossly nonfocal  Data Reviewed: Basic Metabolic Panel:  Recent Labs Lab 04/21/15 1131 04/22/15 0431 04/23/15 0601 04/24/15 0630 04/25/15 0638  NA 143 143 140 144 151*  K 3.4* 2.8* 3.2* 4.1 3.5  CL 118* 117* 117* 119* 125*  CO2 19* 18* 16* 17* 17*  GLUCOSE 153* 158* 148* 121* 125*  BUN 12 11 13 14 14   CREATININE 0.81 0.79 0.85 0.88 0.96  CALCIUM 8.0* 7.9* 7.7* 8.0* 8.5*  MG 1.7 1.6* 2.2 2.5* 2.2   Liver Function Tests:  Recent Labs Lab 04/21/15 1131 04/22/15 0431 04/23/15 0601 04/24/15 0630 04/25/15 0638  AST 236* 272* 234* 286* 294*  ALT 99* 106* 102* 108* 111*  ALKPHOS 120 126 140* 161* 174*  BILITOT 1.1 0.9 1.2  2.1* 1.6*  PROT 6.2* 6.1* 5.8* 5.7* 5.9*  ALBUMIN 2.0* 2.0* 1.9* 1.9* 2.0*    Recent Labs Lab 04/20/15 0430  LIPASE 16*    Recent Labs Lab 04/21/15 1131 04/22/15 0431 04/23/15 0515 04/24/15 0630 04/25/15 0638  AMMONIA 73* 96* 138* 69* 48*   CBC:  Recent Labs Lab 04/21/15 1131 04/23/15 0601  WBC 12.7* 16.4*  HGB 11.4* 8.3*  HCT 34.4* 24.4*  MCV 93.5 93.8  PLT 101* 132*   Cardiac Enzymes: No results for input(s): CKTOTAL, CKMB, CKMBINDEX, TROPONINI in the last 168 hours. BNP: Invalid input(s): POCBNP CBG: No results for input(s): GLUCAP in the last 168 hours.  Recent Results (from the past 240 hour(s))  Urine culture     Status: None   Collection Time: 04/15/15  6:12 PM  Result Value Ref Range Status   Specimen Description URINE, RANDOM  Final   Special Requests NONE  Final   Culture >=100,000 COLONIES/mL KLEBSIELLA PNEUMONIAE  Final   Report Status 04/19/2015 FINAL  Final   Organism ID, Bacteria KLEBSIELLA PNEUMONIAE  Final      Susceptibility   Klebsiella pneumoniae - MIC*    AMPICILLIN >=32 RESISTANT Resistant  CEFAZOLIN <=4 SENSITIVE Sensitive     CEFTRIAXONE <=1 SENSITIVE Sensitive     CIPROFLOXACIN <=0.25 SENSITIVE Sensitive     GENTAMICIN <=1 SENSITIVE Sensitive     IMIPENEM <=0.25 SENSITIVE Sensitive     NITROFURANTOIN 128 RESISTANT Resistant     TRIMETH/SULFA <=20 SENSITIVE Sensitive     AMPICILLIN/SULBACTAM 4 SENSITIVE Sensitive     PIP/TAZO <=4 SENSITIVE Sensitive     * >=100,000 COLONIES/mL KLEBSIELLA PNEUMONIAE  Blood culture (routine x 2)     Status: None   Collection Time: 04/15/15  8:31 PM  Result Value Ref Range Status   Specimen Description BLOOD RIGHT ARM  Final   Special Requests BOTTLES DRAWN AEROBIC AND ANAEROBIC 5CC  Final   Culture NO GROWTH 5 DAYS  Final   Report Status 04/20/2015 FINAL  Final  Blood culture (routine x 2)     Status: None   Collection Time: 04/15/15  8:37 PM  Result Value Ref Range Status   Specimen  Description BLOOD RIGHT HAND  Final   Special Requests BOTTLES DRAWN AEROBIC ONLY 4CC  Final   Culture NO GROWTH 5 DAYS  Final   Report Status 04/20/2015 FINAL  Final  MRSA PCR Screening     Status: None   Collection Time: 04/16/15  5:49 AM  Result Value Ref Range Status   MRSA by PCR NEGATIVE NEGATIVE Final    Comment:        The GeneXpert MRSA Assay (FDA approved for NASAL specimens only), is one component of a comprehensive MRSA colonization surveillance program. It is not intended to diagnose MRSA infection nor to guide or monitor treatment for MRSA infections.      Scheduled Meds: . antiseptic oral rinse  7 mL Mouth Rinse BID  . famotidine  20 mg Oral Daily  . feeding supplement (ENSURE ENLIVE)  237 mL Oral TID PC  . folic acid  1 mg Oral Daily  . furosemide  40 mg Oral Daily  . lactulose  30 g Per Tube QID  . phytonadione  5 mg Oral Daily  . rifaximin  550 mg Per Tube BID  . saccharomyces boulardii  250 mg Oral BID  . spironolactone  100 mg Oral Daily  . thiamine  100 mg Oral Daily   Continuous Infusions: . dextrose 5 % and 0.45% NaCl 10 mL/hr at 04/23/15 1517

## 2015-04-25 NOTE — Progress Notes (Signed)
PT Cancellation Note  Patient Details Name: Vanessa Hoffman MRN: [id number] DOB: 03-Nov-1955   Cancelled Treatment:    Reason Eval/Treat Not Completed: Medical issues which prohibited therapy Patient being cleaned up by RN, reports she just had an episode of emesis. Will hold therapy at this time due to N&V.   Berton Mount 04/25/2015, 5:09 PM Charlsie Merles, [place] [phone]

## 2015-04-25 NOTE — Progress Notes (Signed)
Paged MD regarding pt vomiting with distended abdomen and hypoactive bowel sounds. Orders received

## 2015-04-25 NOTE — Progress Notes (Signed)
Patient ID: DESTINI CAMBRE, female   DOB: Jan 08, 1956, 59 y.o.   MRN: [id number] BP 117/42 mmHg  Pulse 100  Temp(Src) 98.2 F (36.8 C) (Oral)  Resp 16  Ht  (1.626 m)  Wt 78 kg (171 lb 15.3 oz)  BMI 29.50 kg/m2  SpO2 100% Alert, not following commands Speech is unintelligible Moving extremities No indication giving metabolic derangement to pursue operative decompression now.

## 2015-04-25 NOTE — Progress Notes (Signed)
Nutrition Follow-up  DOCUMENTATION CODES:   Obesity unspecified  INTERVENTION:  Increase Ensure Enlive po to TID, each supplement provides 350 kcal and 20 grams of protein  Monitor magnesium, potassium, and phosphorus daily for at least 3 days, MD to replete as needed, as pt is at risk for refeeding syndrome given NPO status >/=7 days prior to diet advancement.   Monitor PO intake for adequacy  NUTRITION DIAGNOSIS:   Predicted suboptimal nutrient intake related to dysphagia as evidenced by meal completion < 50%.  Ongoing  GOAL:   Patient will meet greater than or equal to 90% of their needs  Unmet  MONITOR:   PO intake, Supplement acceptance, Labs, Weight trends, Skin  REASON FOR ASSESSMENT:   Low Braden    ASSESSMENT:   59 y.o. Female transferred from nursing home, where she was discharged following a skull fracture/subdural hematoma early last month. There is not much information available, but apparently the patient is typically able to speak and have a conversation. Today in the afternoon patient was found altered and not responding. To the nursing home knowledge, there hasn't been any history of trauma, fever, nausea, emesis, diarrhea or any other symptoms. Her parents state that her appetite and oral intake have been decreased for the past few days.  Pt asleep at time of visit with half a bottle of Ensure consumed at bedside. Per nursing notes, pt had vomiting last night with distended abdomen and hypoactive bowel sounds. Pt had a BM this morning per nursing notes. Pt now on Florastor probiotics. Per SLP note, pt's mental status today is consistent with yesterday; anticipates pt will need to remain on current diet for duration of stay.   Labs: low calcium, high sodium and chloride, elevated AST and ALT, high alk phos, potassium and magnesium are WNL  Diet Order:  DIET - DYS 1 Room service appropriate?: Yes; Fluid consistency:: Nectar Thick  Skin:  Wound (see  comment) (MASD on sacrum)  Last BM:  9/1  Height:   Ht Readings from Last 1 Encounters:  04/16/15 5' 4"  (1.626 m)    Weight:   Wt Readings from Last 1 Encounters:  04/16/15 171 lb 15.3 oz (78 kg)    Ideal Body Weight:  54.5 kg  BMI:  Body mass index is 29.5 kg/(m^2).  Estimated Nutritional Needs:   Kcal:  1800-2000  Protein:  90-100 gm  Fluid:  1.8-2.0 L  EDUCATION NEEDS:   No education needs identified at this time  [place], LDN Inpatient Clinical Dietitian Pager: [phone] After Hours Pager: [phone]

## 2015-04-25 NOTE — Progress Notes (Signed)
Speech Language Pathology Treatment: Dysphagia  Patient Details Name: Vanessa Hoffman MRN: [id number] DOB: 1956-05-23 Today's Date: 04/25/2015 Time: 4098-1191 SLP Time Calculation (min) (ACUTE ONLY): 20 min  Assessment / Plan / Recommendation Clinical Impression  Pt continues with mental status today nearly consistent with yesterday.  Dysarthria noted but pt following commands approximately 75% of the time with max cues.  Skilled SLP therapy included therapeutic feeds, pt education and determination for tolerance of po diet/readiness for dietary advancement.   Pt tolerated single ice bolus, nectar thick juice and ice cream well with delayed oral transiting but no indications of airway compromise.  However when pt was provided with thin water via tsp she had immediate post-swallow cough  - likely aspiration due to water spilling into open airway. Very slow vertical mastication of moist cracker noted with pt requiring more than 60 seconds to transit/swallow - swallow followed by immediate cough.   Pt required total cues which will increase her aspiration risk.  Recommend continue dys1/nectar and single ice chip boluses with total assist and strict aspiration precautions.  Anticipate this diet may be required for duration of acute stay. Will follow up for readiness for advancement and family education as indicated.   HPI Other Pertinent Information: Pt is a 59 y.o.F SNF resident w/ a Hx Anxiety, Fibromyalgia, Depression, Alcohol Abuse, vascular dementia, tobacco abuse, HTN, HLD, DM2, and a recent skkull fx w/ subdural hematoma who presented to the ED w/ altered mental status. The patient is typically able to speak and have a conversation. The patient was reportedly found in an altered state, not responding. To the nursing home knowledge, there hadn't been any history of trauma, fever, nausea, emesis, diarrhea or any other symptoms. Her family stated her appetite and oral intake had been decreased for a  few days. Pt has been recieving temporary nutrition via NG tube, previously not alert for PO.     Pertinent Vitals Pain Assessment: Faces Pain Score: 2  Faces Pain Scale: Hurts a little bit Pain Location: abdomen Pain Descriptors / Indicators: Aching Pain Intervention(s): Limited activity within patient's tolerance;Monitored during session;Repositioned  SLP Plan  Continue with current plan of care    Recommendations Diet recommendations: Dysphagia 1 (puree);Nectar-thick liquid (single ice boluses) Liquids provided via: Cup;Straw Medication Administration: Crushed with puree Supervision:  (total assist) Compensations: Slow rate;Small sips/bites (consume liquids throughout meal) Postural Changes and/or Swallow Maneuvers: Seated upright 90 degrees;Upright 30-60 min after meal              Oral Care Recommendations: Oral care BID Follow up Recommendations: Skilled Nursing facility Plan: Continue with current plan of care    GO    Donavan Burnet, MS [facility] SLP [phone]

## 2015-04-25 NOTE — Progress Notes (Signed)
Pt noted resting at this time.  x1 vomiting episode. Abdomen distended, audible bowel sounds. MD notified. Received new verbal orders.

## 2015-04-26 ENCOUNTER — Inpatient Hospital Stay (HOSPITAL_COMMUNITY): Payer: Medicare Other

## 2015-04-26 LAB — COMPREHENSIVE METABOLIC PANEL
ALBUMIN: 2.1 g/dL — AB (ref 3.5–5.0)
ALK PHOS: 185 U/L — AB (ref 38–126)
ALT: 116 U/L — ABNORMAL HIGH (ref 14–54)
ANION GAP: 8 (ref 5–15)
AST: 302 U/L — ABNORMAL HIGH (ref 15–41)
BUN: 16 mg/dL (ref 6–20)
CHLORIDE: 128 mmol/L — AB (ref 101–111)
CO2: 17 mmol/L — AB (ref 22–32)
Calcium: 8.6 mg/dL — ABNORMAL LOW (ref 8.9–10.3)
Creatinine, Ser: 1.04 mg/dL — ABNORMAL HIGH (ref 0.44–1.00)
GFR calc Af Amer: >60 mL/min (ref >60)
GFR calc non Af Amer: 58 mL/min — ABNORMAL LOW (ref >60)
GLUCOSE: 124 mg/dL — AB (ref 65–99)
POTASSIUM: 3.8 mmol/L (ref 3.5–5.1)
SODIUM: 153 mmol/L — AB (ref 135–145)
Total Bilirubin: 1.4 mg/dL — ABNORMAL HIGH (ref 0.3–1.2)
Total Protein: 6.3 g/dL — ABNORMAL LOW (ref 6.5–8.1)

## 2015-04-26 LAB — CBC
HEMATOCRIT: 25.7 % — AB (ref 36.0–46.0)
HEMOGLOBIN: 8.7 g/dL — AB (ref 12.0–15.0)
MCH: 31.2 pg (ref 26.0–34.0)
MCHC: 33.9 g/dL (ref 30.0–36.0)
MCV: 92.1 fL (ref 78.0–100.0)
Platelets: 142 10*3/uL — ABNORMAL LOW (ref 150–400)
RBC: 2.79 MIL/uL — AB (ref 3.87–5.11)
RDW: 16.9 % — ABNORMAL HIGH (ref 11.5–15.5)
WBC: 15.8 10*3/uL — AB (ref 4.0–10.5)

## 2015-04-26 LAB — MAGNESIUM: Magnesium: 2 mg/dL (ref 1.7–2.4)

## 2015-04-26 LAB — URINALYSIS, ROUTINE W REFLEX MICROSCOPIC
Bilirubin Urine: NEGATIVE
Glucose, UA: NEGATIVE mg/dL
Hgb urine dipstick: NEGATIVE
Ketones, ur: 15 mg/dL — AB
LEUKOCYTES UA: NEGATIVE
NITRITE: NEGATIVE
PH: 6 (ref 5.0–8.0)
Protein, ur: NEGATIVE mg/dL
SPECIFIC GRAVITY, URINE: 1.02 (ref 1.005–1.030)
Urobilinogen, UA: 0.2 mg/dL (ref 0.0–1.0)

## 2015-04-26 LAB — PROTIME-INR
INR: 1.53 — ABNORMAL HIGH (ref 0.00–1.49)
Prothrombin Time: 18.5 seconds — ABNORMAL HIGH (ref 11.6–15.2)

## 2015-04-26 LAB — AMMONIA: AMMONIA: 57 umol/L — AB (ref 9–35)

## 2015-04-26 MED ORDER — DEXTROSE 5 % IV SOLN
INTRAVENOUS | Status: DC
  Administered 2015-04-26 – 2015-04-28 (×5): via INTRAVENOUS
  Administered 2015-04-29: 500 mL via INTRAVENOUS
  Filled 2015-04-26: qty 1000

## 2015-04-26 MED ORDER — LACTULOSE 10 GM/15ML PO SOLN
30.0000 g | Freq: Four times a day (QID) | ORAL | Status: DC
  Administered 2015-04-26 – 2015-05-01 (×17): 30 g via ORAL
  Filled 2015-04-26 (×15): qty 45

## 2015-04-26 MED ORDER — IOHEXOL 300 MG/ML  SOLN: 25.0000 mL | INTRAMUSCULAR | Status: AC

## 2015-04-26 MED ORDER — IOHEXOL 300 MG/ML  SOLN
100.0000 mL | Freq: Once | INTRAMUSCULAR | Status: AC | PRN
  Administered 2015-04-26: 100 mL via INTRAVENOUS

## 2015-04-26 MED ORDER — POTASSIUM CHLORIDE CRYS ER 20 MEQ PO TBCR
60.0000 meq | EXTENDED_RELEASE_TABLET | Freq: Once | ORAL | Status: AC
  Administered 2015-04-26: 60 meq via ORAL
  Filled 2015-04-26: qty 3

## 2015-04-26 MED ORDER — VITAMIN K1 10 MG/ML IJ SOLN
10.0000 mg | Freq: Once | INTRAMUSCULAR | Status: AC
  Administered 2015-04-26: 10 mg via SUBCUTANEOUS
  Filled 2015-04-26: qty 1

## 2015-04-26 NOTE — Progress Notes (Signed)
Physical Therapy Treatment Patient Details Name: Vanessa Hoffman MRN: [id number] DOB: 05-01-1956 Today's Date: 04/26/2015    History of Present Illness Vanessa Hoffman is a 59 y.o.WF Fm Local nursing home PMHx Anxiety, Fibromyalgia, Depression, Alcohol Abuse, tobacco abuse, HTN, HLD, DM Type 2 without complication. Pt was at hospital in july s/p fall with nondisplaced skull fracture and SDH. Pt came from local SNF due to further AMS.     PT Comments    Pt making very slow progress.  Follow Up Recommendations  SNF     Equipment Recommendations  None recommended by PT    Recommendations for Other Services       Precautions / Restrictions Precautions Precautions: Fall Precaution Comments: bilat UE edema Restrictions Weight Bearing Restrictions: No    Mobility  Bed Mobility Overal bed mobility: Needs Assistance Bed Mobility: Supine to Sit;Sit to Supine     Supine to sit: Max assist;+2 for physical assistance Sit to supine: Max assist;+2 for physical assistance   General bed mobility comments: Assist for all aspects with mobility.  Transfers                 General transfer comment: unsafe at this time due to severe retropulsion  Ambulation/Gait             General Gait Details: unsafe at this time   Stairs            Wheelchair Mobility    Modified Rankin (Stroke Patients Only)       Balance   Sitting-balance support: Bilateral upper extremity supported;Feet supported Sitting balance-Leahy Scale: Poor Sitting balance - Comments: Pt sat EOB x 10 minutes with assist varying from +2 max to min A due to posterior lean. Attempted to have pt place arms on bedside table in front of her to encourage anterior wt shift with minimal effectiveness.  Postural control: Posterior lean                          Cognition Arousal/Alertness: Lethargic Behavior During Therapy: Flat affect;Restless Overall Cognitive Status: No  family/caregiver present to determine baseline cognitive functioning Area of Impairment: Orientation;Attention;Memory;Following commands;Safety/judgement;Awareness;Problem solving   Current Attention Level: Focused Memory: Decreased short-term memory Following Commands: Follows one step commands inconsistently (approx 50% of time) Safety/Judgement: Decreased awareness of safety;Decreased awareness of deficits   Problem Solving: Slow processing;Decreased initiation;Difficulty sequencing;Requires verbal cues;Requires tactile cues      Exercises      General Comments        Pertinent Vitals/Pain Pain Assessment: Faces Faces Pain Scale: Hurts little more Pain Location: back Pain Intervention(s): Limited activity within patient's tolerance;Repositioned    Home Living                      Prior Function            PT Goals (current goals can now be found in the care plan section) Acute Rehab PT Goals Patient Stated Goal: did not state  PT Goal Formulation: Patient unable to participate in goal setting Time For Goal Achievement: 05/07/15 Potential to Achieve Goals: Good Progress towards PT goals: Not progressing toward goals - comment    Frequency  Min 2X/week    PT Plan Current plan remains appropriate    Co-evaluation             End of Session   Activity Tolerance: Patient limited by lethargy Patient left: in bed;with call  bell/phone within reach;with bed alarm set     Time: 4696-2952 PT Time Calculation (min) (ACUTE ONLY): 16 min  Charges:  $Therapeutic Activity: 8-22 mins                    G Codes:      Vanessa Hoffman 05-09-15, 9:38 AM  [facility] PT [phone]

## 2015-04-26 NOTE — Clinical Documentation Improvement (Signed)
Hospitalist  Abnormal Lab/Test Results:   Sodium: 9/02:  153. 9/01:  151.  Possible Clinical Conditions associated with below indicators  Hypernatremia  Other Condition  Cannot Clinically Determine   Please exercise your independent, professional judgment when responding. A specific answer is not anticipated or expected.   Thank You,  Rodman Pickle Health Information Management [place]

## 2015-04-26 NOTE — Progress Notes (Signed)
Paged MD regarding pt becoming increasingly restless which is a change from the last two nights.

## 2015-04-26 NOTE — Clinical Social Work Note (Signed)
CSW reviewed chart, once pt is medically stable she can return back to [facility]. CSW will continue to provided support to the family and assist with discharge needs.  Meribeth Vitug, MSW, LCSWA [phone]  

## 2015-04-26 NOTE — Progress Notes (Signed)
Patient ID: Vanessa Hoffman, female   DOB: May 18, 1956, 59 y.o.   MRN: [id number] [facility] PROGRESS NOTE  Vanessa Hoffman [id number]:[id number] DOB: 1956/08/15 DOA: 04/15/2015 PCP: No primary care provider on file.   Subjective:   Spoke with husband, mother and father at bedside. Explained the SDH, skull fracture and hepatic encephalopathy. Explained the poor prognosis, if patient not improving over the weekend she might need palliative care.  She is irritable today, CT scan did not show worsening of the SDH. I will get CT scan of abdomen pelvis rule out intra-abdominal pathology.   Brief narrative:    59 year old female from nursing home with past medical hitory of anxiety, depression, fibromyalgia, hypertension. She presented from SNF where she was residing after sustaining skull fracture and subdural hematoma. She presented with worsening mental status changes, lethargy.   Barrier to discharge: Lethargy, dysphagia   Assessment/Plan:    Principal Problem: Acute hepatic encephalopathy - Per CT on this admission, little change in right parietal subdural hematoma. Pt also with history of alcohol abuse so her altered mental status could be from hepatic encephalopathy.  -Hypokalemia corrected, hypokalemia increases renal ammonium production. - Ammonia level reached Hoffman peak of 138, 48 this morning. -Patient getting her lactulose and rifaximin, improving, continue current medications.  Subdural hematoma, right -Hs subacute subdural hematoma measures 5 mm over the right cerebral hemisphere with mild right to left midline shift. -Patient seen by neurosurgery and likely to have surgery but not emergent.   Skull base fracture -Patient has nondisplaced fracture through posterior left ovarian extending to the skull base. -Per radiology possibly extending to the left occipital condyle.  Alcohol abuse - Per husband patient used to drink Hoffman fifth of vodka for 10-11 years - UDS  unremarkable - Patient's mother has confirmed pt drug use history and alcohol abuse   Aspiration risk / Severe protein calorie malnutrition / Failure to thrive in adult  - High risk of aspiration due to altered mental status - Continue aspiration precautions - Continue NG tube - SLP recommended dysphagia 1 with nectar thick liquids.  Chronic liver disease, clinically consistent with cirrhosis versus fatty liver  - Has transaminitis with AST/ALT ratio greater than 2/1 consistent with alcoholic consumption. - Patient has hypoalbuminemia, thrombocytopenia and hyperammonemia - Hepatitis panels negative, HIV is negative as well. - Abdominal ultrasound; moderate ascites, liver texture consistent with fatty liver.  Hypokalemia / Hypomagnesemia  - Likely due to loose stools from the lactulose. - Supplemented potassium and magnesium  - Follow up magnesium and potassium, had BMP in Hoffman.m.  Frequent falls - Secondary to intoxication, altered mental status - PT eval once she is able to participate   Hyperlipidemia - Resume pravastatin once LFTS normal  Anemia of chronic disease / Thrombocytopenia  - Unclear if it's secondary to alcohol versus chronic liver disease. - Hemoglobin stable  - Platelets 101 - Using SCD's for DVT prophylaxis   AKI (acute kidney injury) / Metabolic acidosis  - Likely prerenal, dehydration - Resolved with IV fluid hydration, presented with creatinine of 1.26, creatinine currently 0.8.  UTI Klebsiella pneumonia / Leukocytosis  - Treated with IV Rocephin for 7 days.    DVT Prophylaxis  - SCD's bilaterally    Code Status: Full.  Family Communication:  Family not at the bedside at this time Disposition Plan: needs PT evaluation   IV access:  Peripheral IV  Procedures and diagnostic studies:    Ct Head Wo Contrast 04/20/2015  Little change  in size of RIGHT parietal subdural hematoma.  No new intracranial hemorrhage or infarction identified.   Electronically  Signed   By: Ulyses Southward M.D.   On: 04/20/2015 19:16   Dg Swallowing Func-speech Pathology 04/22/2015  Moderately severe oral and moderate pharyngeal dysphagia with sensorimotor  deficits and largely impacted by pt's mental status.   Medical Consultants:  Neurology  Other Consultants:  SLP Physical therapy  IAnti-Infectives:   Rocephin 04/15/2015 --> 04/22/2015 Rifaximin     Vanessa Lagrange A, MD  [facility] Pager [phone]  Time spent in minutes: 25 minutes  If 7PM-7AM, please contact night-coverage [email or web address] Password [facility] 04/26/2015, 12:53 PM   LOS: 11 days    HPI/Subjective: No acute overnight events. No respiratory distress.   Objective: Filed Vitals:   04/25/15 2130 04/26/15 0111 04/26/15 0530 04/26/15 1038  BP: 109/59 110/96 116/94 96/50  Pulse: 104 89 92 105  Temp: 98.3 F (36.8 C) 98.5 F (36.9 C) 98.6 F (37 C) 99 F (37.2 C)  TempSrc: Oral Oral Oral Axillary  Resp: Height:      Weight:      SpO2: 97% 99% 97% 97%    Intake/Output Summary (Last 24 hours) at 04/26/15 1253 Last data filed at 04/26/15 0805  Gross per 24 hour  Intake     40 ml  Output   1350 ml  Net  -1310 ml    Exam:   General:  Pt is alert, not in acute distress; NG tube in place   Cardiovascular: Regular rate and rhythm, S1/S2 appreciated   Respiratory: Clear to auscultation bilaterally, no wheezing, no crackles, no rhonchi  Abdomen: Soft, non tender, non distended, bowel sounds present  Extremities: No edema, pulses DP and PT palpable bilaterally  Neuro: Grossly nonfocal  Data Reviewed: Basic Metabolic Panel:  Recent Labs Lab 04/22/15 0431 04/23/15 0601 04/24/15 0630 04/25/15 0638 04/26/15 0427  NA 143 140 144 151* 153*  K 2.8* 3.2* 4.1 3.5 3.8  CL 117* 117* 119* 125* 128*  CO2 18* 16* 17* 17* 17*  GLUCOSE 158* 148* 121* 125* 124*  BUN CREATININE 0.79 0.85 0.88 0.96 1.04*  CALCIUM 7.9* 7.7* 8.0* 8.5* 8.6*  MG 1.6* 2.2  2.5* 2.2 2.0   Liver Function Tests:  Recent Labs Lab 04/22/15 0431 04/23/15 0601 04/24/15 0630 04/25/15 0638 04/26/15 0427  AST 272* 234* 286* 294* 302*  ALT 106* 102* 108* 111* 116*  ALKPHOS 126 140* 161* 174* 185*  BILITOT 0.9 1.2 2.1* 1.6* 1.4*  PROT 6.1* 5.8* 5.7* 5.9* 6.3*  ALBUMIN 2.0* 1.9* 1.9* 2.0* 2.1*    Recent Labs Lab 04/20/15 0430  LIPASE 16*    Recent Labs Lab 04/22/15 0431 04/23/15 0515 04/24/15 0630 04/25/15 0638 04/26/15 0427  AMMONIA 96* 138* 69* 48* 57*   CBC:  Recent Labs Lab 04/21/15 1131 04/23/15 0601 04/26/15 0427  WBC 12.7* 16.4* 15.8*  HGB 11.4* 8.3* 8.7*  HCT 34.4* 24.4* 25.7*  MCV 93.5 93.8 92.1  PLT 101* 132* 142*   Cardiac Enzymes: No results for input(s): CKTOTAL, CKMB, CKMBINDEX, TROPONINI in the last 168 hours. BNP: Invalid input(s): POCBNP CBG: No results for input(s): GLUCAP in the last 168 hours.  No results found for this or any previous visit (from the past 240 hour(s)).   Scheduled Meds: . antiseptic oral rinse  7 mL Mouth Rinse BID  . famotidine  20 mg Oral Daily  . feeding supplement (ENSURE ENLIVE)  237 mL Oral TID PC  . folic acid  1 mg Oral Daily  . lactulose  30 g Per Tube QID  . rifaximin  550 mg Per Tube BID  . saccharomyces boulardii  250 mg Oral BID  . spironolactone  100 mg Oral Daily  . thiamine  100 mg Oral Daily   Continuous Infusions: . dextrose 75 mL/hr at 04/26/15 [phone]

## 2015-04-26 NOTE — Progress Notes (Signed)
Speech Language Pathology Treatment: Dysphagia  Patient Details Name: Vanessa Hoffman MRN: [id number] DOB: Jan 12, 1956 Today's Date: 04/26/2015 Time: 4098-1191 SLP Time Calculation (min) (ACUTE ONLY): 23 min  Assessment / Plan / Recommendation Clinical Impression  Assisted pt with lunch - husband present for education.  Pt continues to require total assist for self-feeding - visual, tactile, and verbal cues needed to attend to and carry out mastication.  Persisting prolonged oral phase (>60 seconds) noted with purees; less time required to transit nectar liquids.  Pt communicating minimally today - producing primarily vegetative voicing/groaning without effort to make needs known.  Turns head away to indicate being finished eating.  Required manual assist to remove oral residue.  Recommend continuing dysphagia 1, nectars upon D/C to SNF; pt will need SLP f/u in facility to address dysphagia.  Spouse agrees.    HPI Other Pertinent Information: Pt is a 59 y.o.F SNF resident w/ a Hx Anxiety, Fibromyalgia, Depression, Alcohol Abuse, vascular dementia, tobacco abuse, HTN, HLD, DM2, and a recent skkull fx w/ subdural hematoma who presented to the ED w/ altered mental status. The patient is typically able to speak and have a conversation. The patient was reportedly found in an altered state, not responding. To the nursing home knowledge, there hadn't been any history of trauma, fever, nausea, emesis, diarrhea or any other symptoms. Her family stated her appetite and oral intake had been decreased for a few days. Pt has been recieving temporary nutrition via NG tube, previously not alert for PO.     Pertinent Vitals Pain Assessment: Faces Faces Pain Scale: No hurt Pain Location: back Pain Intervention(s): Limited activity within patient's tolerance;Repositioned  SLP Plan  Continue with current plan of care    Recommendations Diet recommendations: Dysphagia 1 (puree);Nectar-thick liquid Liquids provided  via: Cup;Straw Medication Administration: Crushed with puree Supervision: Full supervision/cueing for compensatory strategies Compensations: Slow rate;Small sips/bites Postural Changes and/or Swallow Maneuvers: Seated upright 90 degrees;Upright 30-60 min after meal              Oral Care Recommendations: Oral care BID Follow up Recommendations: Skilled Nursing facility Plan: Continue with current plan of care  Vanessa Hoffman, [place] CCC/SLP Pager [phone]       Vanessa Hoffman 04/26/2015, 12:13 PM

## 2015-04-27 LAB — COMPREHENSIVE METABOLIC PANEL
ALBUMIN: 2 g/dL — AB (ref 3.5–5.0)
ALK PHOS: 172 U/L — AB (ref 38–126)
ALT: 108 U/L — AB (ref 14–54)
AST: 245 U/L — ABNORMAL HIGH (ref 15–41)
BUN: 13 mg/dL (ref 6–20)
CO2: 18 mmol/L — AB (ref 22–32)
Calcium: 8.5 mg/dL — ABNORMAL LOW (ref 8.9–10.3)
Chloride: >130 mmol/L (ref 101–111)
Creatinine, Ser: 1.02 mg/dL — ABNORMAL HIGH (ref 0.44–1.00)
GFR calc non Af Amer: 60 mL/min — ABNORMAL LOW (ref >60)
GLUCOSE: 151 mg/dL — AB (ref 65–99)
POTASSIUM: 4.3 mmol/L (ref 3.5–5.1)
SODIUM: 154 mmol/L — AB (ref 135–145)
Total Bilirubin: 1.4 mg/dL — ABNORMAL HIGH (ref 0.3–1.2)
Total Protein: 6.3 g/dL — ABNORMAL LOW (ref 6.5–8.1)

## 2015-04-27 LAB — AMMONIA: Ammonia: 35 umol/L (ref 9–35)

## 2015-04-27 LAB — MAGNESIUM: MAGNESIUM: 2 mg/dL (ref 1.7–2.4)

## 2015-04-27 LAB — PROTIME-INR
INR: 1.57 — ABNORMAL HIGH (ref 0.00–1.49)
Prothrombin Time: 18.8 seconds — ABNORMAL HIGH (ref 11.6–15.2)

## 2015-04-27 NOTE — Progress Notes (Signed)
Patient vomited x1

## 2015-04-27 NOTE — Progress Notes (Signed)
CRITICAL VALUE ALERT  Critical value received:  Chloride >130  Date of notification:  04/27/2015  Time of notification:  0622  Critical value read back: yes  Nurse who received alert:  Lonny Prude  MD notified (1st page):  Benedetto Coons  Time of first page:  0628  MD notified (2nd page): n/a  Time of second page: n/a  Responding MD:  Awaiting   Time MD responded:  Awaiting

## 2015-04-27 NOTE — Progress Notes (Signed)
Patient ID: Vanessa Hoffman, female   DOB: Sep 04, 1955, 59 y.o.   MRN: [id number] [facility] PROGRESS NOTE  Vanessa Hoffman [id number] DOB: [birth year] DOA: 04/15/2015 PCP: No primary care provider on file.   Subjective:   Appears slightly better than yesterday, able to follow simple commands. Asked physical therapy to evaluate the patient. Developed hypernatremia   Brief narrative:    59 year old female from nursing home with past medical hitory of anxiety, depression, fibromyalgia, hypertension. She presented from SNF where she was residing after sustaining skull fracture and subdural hematoma. She presented with worsening mental status changes, lethargy.   Barrier to discharge: Lethargy, dysphagia   Assessment/Plan:    Principal Problem: Acute hepatic encephalopathy - Per CT on this admission, little change in right parietal subdural hematoma. Pt also with history of alcohol abuse so her altered mental status could be from hepatic encephalopathy.  -Hypokalemia corrected, hypokalemia increases renal ammonium production. - Ammonia level reached a peak of 138, 48 this morning. -Patient getting her lactulose and rifaximin, improving, continue current medications.  Subdural hematoma, right -Hs subacute subdural hematoma measures 5 mm over the right cerebral hemisphere with mild right to left midline shift. -Patient seen by neurosurgery and likely to have surgery but not emergent.   Skull base fracture -Patient has nondisplaced fracture through posterior left ovarian extending to the skull base. -Per radiology possibly extending to the left occipital condyle.  Alcohol abuse - Per husband patient used to drink a fifth of vodka for 10-11 years - UDS unremarkable - Patient's mother has confirmed pt drug use history and alcohol abuse   Aspiration risk / Severe protein calorie malnutrition / Failure to thrive in adult  - High risk of aspiration due to altered mental  status - Continue aspiration precautions - Continue NG tube - SLP recommended dysphagia 1 with nectar thick liquids.  Chronic liver disease, clinically consistent with cirrhosis versus fatty liver  - Has transaminitis with AST/ALT ratio greater than 2/1 consistent with alcoholic consumption. - Patient has hypoalbuminemia, thrombocytopenia and hyperammonemia - Hepatitis panels negative, HIV is negative as well. - Abdominal ultrasound; moderate ascites, liver texture consistent with fatty liver.  Hypokalemia / Hypomagnesemia  - Likely due to loose stools from the lactulose. - Supplemented potassium and magnesium  - Follow up magnesium and potassium, had BMP in a.m.  Frequent falls - Secondary to intoxication, altered mental status - PT eval once she is able to participate   Hyperlipidemia - Resume pravastatin once LFTS normal  Anemia of chronic disease / Thrombocytopenia  - Unclear if it's secondary to alcohol versus chronic liver disease. - Hemoglobin stable  - Platelets 101 - Using SCD's for DVT prophylaxis   AKI (acute kidney injury) / Metabolic acidosis  - Likely prerenal, dehydration - Resolved with IV fluid hydration, presented with creatinine of 1.26, creatinine currently 0.8.  UTI Klebsiella pneumonia / Leukocytosis  - Treated with IV Rocephin for 7 days.  Hypernatremia -Sodium is 154, started on IV D5W infusion, check sodium level in the morning.   DVT Prophylaxis  - SCD's bilaterally    Code Status: Full.  Family Communication:  Family not at the bedside at this time Disposition Plan: needs PT evaluation   IV access:  Peripheral IV  Procedures and diagnostic studies:    Ct Head Wo Contrast 04/20/2015  Little change in size of RIGHT parietal subdural hematoma.  No new intracranial hemorrhage or infarction identified.   Electronically Signed   By: Loraine Leriche  Tyron Russell M.D.   On: 04/20/2015 19:16   Dg Swallowing Func-speech Pathology 04/22/2015  Moderately severe oral  and moderate pharyngeal dysphagia with sensorimotor  deficits and largely impacted by pt's mental status.   Medical Consultants:  Neurology  Other Consultants:  SLP Physical therapy  IAnti-Infectives:   Rocephin 04/15/2015 --> 04/22/2015 Rifaximin     Antionio Negron A, MD  [facility] Pager [phone]  Time spent in minutes: 25 minutes  If 7PM-7AM, please contact night-coverage [email or web address] Password [email or web address] 04/27/2015, 10:45 AM   LOS: 12 days    Objective: Filed Vitals:   04/27/15 0116 04/27/15 0351 04/27/15 0542 04/27/15 0943  BP: 119/89  115/61 118/51  Pulse: 88  101 99  Temp: 98.4 F (36.9 C)  98 F (36.7 C) 97.4 F (36.3 C)  TempSrc: Axillary  Oral Axillary  Resp: 18  18 18   Height:      Weight:  86.818 kg (191 lb 6.4 oz)    SpO2: 98%  98% 96%    Intake/Output Summary (Last 24 hours) at 04/27/15 1045 Last data filed at 04/26/15 1800  Gross per 24 hour  Intake 796.25 ml  Output    900 ml  Net -103.75 ml    Exam:   General:  Pt is alert, not in acute distress; NG tube in place   Cardiovascular: Regular rate and rhythm, S1/S2 appreciated   Respiratory: Clear to auscultation bilaterally, no wheezing, no crackles, no rhonchi  Abdomen: Soft, non tender, non distended, bowel sounds present  Extremities: No edema, pulses DP and PT palpable bilaterally  Neuro: Grossly nonfocal  Data Reviewed: Basic Metabolic Panel:  Recent Labs Lab 04/23/15 0601 04/24/15 0630 04/25/15 0638 04/26/15 0427 04/27/15 0410  NA 140 144 151* 153* 154*  K 3.2* 4.1 3.5 3.8 4.3  CL 117* 119* 125* 128* >130*  CO2 16* 17* 17* 17* 18*  GLUCOSE 148* 121* 125* 124* 151*  BUN 13 14 14 16 13   CREATININE 0.85 0.88 0.96 1.04* 1.02*  CALCIUM 7.7* 8.0* 8.5* 8.6* 8.5*  MG 2.2 2.5* 2.2 2.0 2.0   Liver Function Tests:  Recent Labs Lab 04/23/15 0601 04/24/15 0630 04/25/15 0638 04/26/15 0427 04/27/15 0410  AST 234* 286* 294* 302* 245*  ALT 102* 108* 111* 116* 108*   ALKPHOS 140* 161* 174* 185* 172*  BILITOT 1.2 2.1* 1.6* 1.4* 1.4*  PROT 5.8* 5.7* 5.9* 6.3* 6.3*  ALBUMIN 1.9* 1.9* 2.0* 2.1* 2.0*   No results for input(s): LIPASE, AMYLASE in the last 168 hours.  Recent Labs Lab 04/23/15 0515 04/24/15 0630 04/25/15 0638 04/26/15 0427 04/27/15 0410  AMMONIA 138* 69* 48* 57* 35   CBC:  Recent Labs Lab 04/21/15 1131 04/23/15 0601 04/26/15 0427  WBC 12.7* 16.4* 15.8*  HGB 11.4* 8.3* 8.7*  HCT 34.4* 24.4* 25.7*  MCV 93.5 93.8 92.1  PLT 101* 132* 142*   Cardiac Enzymes: No results for input(s): CKTOTAL, CKMB, CKMBINDEX, TROPONINI in the last 168 hours. BNP: Invalid input(s): POCBNP CBG: No results for input(s): GLUCAP in the last 168 hours.  No results found for this or any previous visit (from the past 240 hour(s)).   Scheduled Meds: . antiseptic oral rinse  7 mL Mouth Rinse BID  . famotidine  20 mg Oral Daily  . feeding supplement (ENSURE ENLIVE)  237 mL Oral TID PC  . folic acid  1 mg Oral Daily  . lactulose  30 g Oral QID  . rifaximin  550 mg Per Tube BID  . saccharomyces boulardii  250 mg Oral BID  . thiamine  100 mg Oral Daily   Continuous Infusions: . dextrose 100 mL/hr at 04/27/15 0800

## 2015-04-27 NOTE — Progress Notes (Signed)
Patient ID: Vanessa Hoffman, female   DOB: Jun 11, 1956, 59 y.o.   MRN: [id number] No new neuro changes Her Ct scan does not really show any major issue No indication for surgery at this time per Dr Franky Macho.

## 2015-04-28 DIAGNOSIS — Z515 Encounter for palliative care: Secondary | ICD-10-CM

## 2015-04-28 LAB — COMPREHENSIVE METABOLIC PANEL
ALT: 92 U/L — AB (ref 14–54)
AST: 180 U/L — AB (ref 15–41)
Albumin: 1.8 g/dL — ABNORMAL LOW (ref 3.5–5.0)
Alkaline Phosphatase: 157 U/L — ABNORMAL HIGH (ref 38–126)
Anion gap: 5 (ref 5–15)
BUN: 11 mg/dL (ref 6–20)
CHLORIDE: 128 mmol/L — AB (ref 101–111)
CO2: 17 mmol/L — ABNORMAL LOW (ref 22–32)
CREATININE: 1.02 mg/dL — AB (ref 0.44–1.00)
Calcium: 8.3 mg/dL — ABNORMAL LOW (ref 8.9–10.3)
GFR calc Af Amer: >60 mL/min (ref >60)
GFR, EST NON AFRICAN AMERICAN: 60 mL/min — AB (ref >60)
Glucose, Bld: 152 mg/dL — ABNORMAL HIGH (ref 65–99)
Potassium: 4.3 mmol/L (ref 3.5–5.1)
Sodium: 150 mmol/L — ABNORMAL HIGH (ref 135–145)
Total Bilirubin: 1.2 mg/dL (ref 0.3–1.2)
Total Protein: 5.6 g/dL — ABNORMAL LOW (ref 6.5–8.1)

## 2015-04-28 LAB — AMMONIA: Ammonia: 43 umol/L — ABNORMAL HIGH (ref 9–35)

## 2015-04-28 LAB — PROTIME-INR
INR: 1.56 — ABNORMAL HIGH (ref 0.00–1.49)
PROTHROMBIN TIME: 18.7 s — AB (ref 11.6–15.2)

## 2015-04-28 LAB — MAGNESIUM: MAGNESIUM: 1.8 mg/dL (ref 1.7–2.4)

## 2015-04-28 NOTE — Progress Notes (Signed)
Family meeting set for 04/29/15 at 1100 am at pt's room. Present will be pt's father Fayne Norrie and step-mother Lequita Asal @ [phone]; pt's mother Raechel Ache [phone] and pt's husband, who is HCPOA, Devoria Glassing via phone. Pt's husband is in the process of moving pt and himself from [place] to [place]. His number is [phone] Thank you,  Eduard Roux, ANP-ACHPN

## 2015-04-28 NOTE — Progress Notes (Signed)
Patient ID: Vanessa Hoffman, female   DOB: Mar 03, 1956, 59 y.o.   MRN: [id number] [facility] PROGRESS NOTE  KEYON WINNICK WUJ:[id number] DOB: 23-Jul-1956 DOA: 04/15/2015 PCP: No primary care provider on file.   Subjective:   Continues to be lethargic, confused and disoriented. Had a prolonged discussion with the family yesterday, I explained the expected poor outcome. Family aware that patient is not doing okay, they wanted to talk to palliative/hospice team.  Brief narrative:    59 year old female from nursing home with past medical hitory of anxiety, depression, fibromyalgia, hypertension. She presented from SNF where she was residing after sustaining skull fracture and subdural hematoma. She presented with worsening mental status changes, lethargy.   Barrier to discharge: Lethargy, dysphagia   Assessment/Plan:    Principal Problem: Acute hepatic encephalopathy - Per CT on this admission, little change in right parietal subdural hematoma. Pt also with history of alcohol abuse so her altered mental status could be from hepatic encephalopathy.  -Hypokalemia corrected, hypokalemia increases renal ammonium production. - Ammonia level reached a peak of 138, 48 this morning. -Continued lactulose and rifaximin, patient is not improving as expected.  Subdural hematoma, right -Hs subacute subdural hematoma measures 5 mm over the right cerebral hemisphere with mild right to left midline shift. -Patient seen by neurosurgery and likely to have surgery but not emergent.   Skull base fracture -Patient has nondisplaced fracture through posterior left ovarian extending to the skull base. -Per radiology possibly extending to the left occipital condyle.  Alcohol abuse - Per husband patient used to drink a fifth of vodka for 10-11 years - UDS unremarkable - Patient's mother has confirmed pt drug use history and alcohol abuse   Aspiration risk / Severe protein calorie malnutrition /  Failure to thrive in adult  - High risk of aspiration due to altered mental status - Continue aspiration precautions - Continue NG tube - SLP recommended dysphagia 1 with nectar thick liquids.  Chronic liver disease, clinically consistent with cirrhosis versus fatty liver  - Has transaminitis with AST/ALT ratio greater than 2/1 consistent with alcoholic consumption. - Patient has hypoalbuminemia, thrombocytopenia and hyperammonemia - Hepatitis panels negative, HIV is negative as well. - Abdominal ultrasound; moderate ascites, liver texture consistent with fatty liver.  Hypokalemia / Hypomagnesemia  - Likely due to loose stools from the lactulose. - Supplemented potassium and magnesium  - Follow up magnesium and potassium, had BMP in a.m.  Frequent falls - Secondary to intoxication, altered mental status - PT eval once she is able to participate   Hyperlipidemia - Resume pravastatin once LFTS normal  Anemia of chronic disease / Thrombocytopenia  - Unclear if it's secondary to alcohol versus chronic liver disease. - Hemoglobin stable  - Platelets 101 - Using SCD's for DVT prophylaxis   AKI (acute kidney injury) / Metabolic acidosis  - Likely prerenal, dehydration - Resolved with IV fluid hydration, presented with creatinine of 1.26, creatinine currently 0.8.  UTI Klebsiella pneumonia / Leukocytosis  - Treated with IV Rocephin for 7 days.  Hypernatremia -Sodium is 154, started on IV D5W infusion, check sodium level in the morning. -Sodium improved from 150 12/11/1948 since yesterday, continue IV fluids. -D5W his helping with the hypernatremia but increasing the whole body anasarca, can't use Lasix now because of recent hypernatremia.  Coagulopathy Likely secondary to chronic liver disease, on the 30th INR was 2.26, patient received multiple doses of vitamin K. INR is 1.56., Not sure if is going to improve further.  DVT Prophylaxis  - SCD's bilaterally    Code Status:  Full.  Family Communication:  Family not at the bedside at this time Disposition Plan: needs PT evaluation   IV access:  Peripheral IV  Procedures and diagnostic studies:    Ct Head Wo Contrast 04/20/2015  Little change in size of RIGHT parietal subdural hematoma.  No new intracranial hemorrhage or infarction identified.   Electronically Signed   By: Ulyses Southward M.D.   On: 04/20/2015 19:16   Dg Swallowing Func-speech Pathology 04/22/2015  Moderately severe oral and moderate pharyngeal dysphagia with sensorimotor  deficits and largely impacted by pt's mental status.   Medical Consultants:  Neurology  Other Consultants:  SLP Physical therapy  IAnti-Infectives:   Rocephin 04/15/2015 --> 04/22/2015 Rifaximin     Anastacia Reinecke A, MD  [facility] Pager [phone]  Time spent in minutes: 25 minutes  If 7PM-7AM, please contact night-coverage [email or web address] Password [email or web address] 04/28/2015, 10:59 AM   LOS: 13 days    Objective: Filed Vitals:   04/28/15 0148 04/28/15 0426 04/28/15 0709 04/28/15 0928  BP: 124/71  97/73 150/137  Pulse: 103  101 140  Temp: 99.3 F (37.4 C)  98.2 F (36.8 C) 98 F (36.7 C)  TempSrc: Axillary  Axillary Oral  Resp: 20  20 18   Height:      Weight:  87.232 kg (192 lb 5 oz)    SpO2: 95%  98% 98%    Intake/Output Summary (Last 24 hours) at 04/28/15 1059 Last data filed at 04/27/15 1210  Gross per 24 hour  Intake    120 ml  Output     60 ml  Net     60 ml    Exam:   General:  Pt is alert, not in acute distress; NG tube in place   Cardiovascular: Regular rate and rhythm, S1/S2 appreciated   Respiratory: Clear to auscultation bilaterally, no wheezing, no crackles, no rhonchi  Abdomen: Soft, non tender, non distended, bowel sounds present  Extremities: No edema, pulses DP and PT palpable bilaterally  Neuro: Grossly nonfocal  Data Reviewed: Basic Metabolic Panel:  Recent Labs Lab 04/24/15 0630 04/25/15 0638 04/26/15 0427  04/27/15 0410 04/28/15 0620  NA 144 151* 153* 154* 150*  K 4.1 3.5 3.8 4.3 4.3  CL 119* 125* 128* >130* 128*  CO2 17* 17* 17* 18* 17*  GLUCOSE 121* 125* 124* 151* 152*  BUN 14 14 16 13 11   CREATININE 0.88 0.96 1.04* 1.02* 1.02*  CALCIUM 8.0* 8.5* 8.6* 8.5* 8.3*  MG 2.5* 2.2 2.0 2.0 1.8   Liver Function Tests:  Recent Labs Lab 04/24/15 0630 04/25/15 0638 04/26/15 0427 04/27/15 0410 04/28/15 0620  AST 286* 294* 302* 245* 180*  ALT 108* 111* 116* 108* 92*  ALKPHOS 161* 174* 185* 172* 157*  BILITOT 2.1* 1.6* 1.4* 1.4* 1.2  PROT 5.7* 5.9* 6.3* 6.3* 5.6*  ALBUMIN 1.9* 2.0* 2.1* 2.0* 1.8*   No results for input(s): LIPASE, AMYLASE in the last 168 hours.  Recent Labs Lab 04/24/15 0630 04/25/15 0638 04/26/15 0427 04/27/15 0410 04/28/15 0620  AMMONIA 69* 48* 57* 35 43*   CBC:  Recent Labs Lab 04/21/15 1131 04/23/15 0601 04/26/15 0427  WBC 12.7* 16.4* 15.8*  HGB 11.4* 8.3* 8.7*  HCT 34.4* 24.4* 25.7*  MCV 93.5 93.8 92.1  PLT 101* 132* 142*   Cardiac Enzymes: No results for input(s): CKTOTAL, CKMB, CKMBINDEX, TROPONINI in the last 168 hours. BNP: Invalid input(s): POCBNP CBG: No results for input(s): GLUCAP  in the last 168 hours.  No results found for this or any previous visit (from the past 240 hour(s)).   Scheduled Meds: . antiseptic oral rinse  7 mL Mouth Rinse BID  . famotidine  20 mg Oral Daily  . feeding supplement (ENSURE ENLIVE)  237 mL Oral TID PC  . folic acid  1 mg Oral Daily  . lactulose  30 g Oral QID  . rifaximin  550 mg Per Tube BID  . saccharomyces boulardii  250 mg Oral BID  . thiamine  100 mg Oral Daily   Continuous Infusions: . dextrose 100 mL/hr at 04/28/15 7829

## 2015-04-29 DIAGNOSIS — K7460 Unspecified cirrhosis of liver: Secondary | ICD-10-CM

## 2015-04-29 LAB — PROTIME-INR
INR: 1.71 — ABNORMAL HIGH (ref 0.00–1.49)
Prothrombin Time: 20.1 seconds — ABNORMAL HIGH (ref 11.6–15.2)

## 2015-04-29 LAB — COMPREHENSIVE METABOLIC PANEL
ALT: 76 U/L — ABNORMAL HIGH (ref 14–54)
AST: 117 U/L — ABNORMAL HIGH (ref 15–41)
Albumin: 1.7 g/dL — ABNORMAL LOW (ref 3.5–5.0)
Alkaline Phosphatase: 141 U/L — ABNORMAL HIGH (ref 38–126)
Anion gap: 6 (ref 5–15)
BUN: 11 mg/dL (ref 6–20)
CO2: 18 mmol/L — ABNORMAL LOW (ref 22–32)
Calcium: 8.2 mg/dL — ABNORMAL LOW (ref 8.9–10.3)
Chloride: 125 mmol/L — ABNORMAL HIGH (ref 101–111)
Creatinine, Ser: 1.01 mg/dL — ABNORMAL HIGH (ref 0.44–1.00)
GFR calc Af Amer: >60 mL/min (ref >60)
GFR calc non Af Amer: >60 mL/min (ref >60)
Glucose, Bld: 152 mg/dL — ABNORMAL HIGH (ref 65–99)
Potassium: 3.8 mmol/L (ref 3.5–5.1)
Sodium: 149 mmol/L — ABNORMAL HIGH (ref 135–145)
Total Bilirubin: 1.1 mg/dL (ref 0.3–1.2)
Total Protein: 5.5 g/dL — ABNORMAL LOW (ref 6.5–8.1)

## 2015-04-29 LAB — MAGNESIUM: Magnesium: 1.7 mg/dL (ref 1.7–2.4)

## 2015-04-29 LAB — AMMONIA: Ammonia: 38 umol/L — ABNORMAL HIGH (ref 9–35)

## 2015-04-29 MED ORDER — LORAZEPAM 2 MG/ML PO CONC: 0.5000 mg | ORAL | Status: DC | PRN

## 2015-04-29 MED ORDER — MORPHINE SULFATE (CONCENTRATE) 10 MG/0.5ML PO SOLN
5.0000 mg | ORAL | Status: DC | PRN
  Administered 2015-04-30: 5 mg via ORAL
  Filled 2015-04-29: qty 0.5

## 2015-04-29 MED ORDER — METOCLOPRAMIDE HCL 5 MG/5ML PO SOLN
5.0000 mg | Freq: Three times a day (TID) | ORAL | Status: DC
  Administered 2015-04-29 – 2015-05-01 (×5): 5 mg via ORAL
  Filled 2015-04-29 (×9): qty 5

## 2015-04-29 NOTE — Care Management Important Message (Signed)
Important Message  Patient Details  Name: CYDNI REDDOCH MRN: [id number] Date of Birth: [birth year]   Medicare Important Message Given:  Yes-third notification given    Bernadette Hoit 04/29/2015, 8:57 AM

## 2015-04-29 NOTE — Consult Note (Signed)
Consultation Note Date: 04/29/2015   Patient Name: Vanessa Hoffman  DOB: [birth year]  MRN: [id number]  Age / Sex: 59 y.o., female   PCP: No primary care provider on file. Referring Physician: Verlee Monte, MD  Reason for Consultation: Establishing goals of care and hospice  Palliative Care Assessment and Plan Summary of Established Goals of Care and Medical Treatment Preferences    Palliative Care Discussion Held Today:   I met today with [name]'s mother Vanessa Hoffman, father and stepmother Vanessa Hoffman and Vanessa Hoffman, and husband Vanessa Hoffman via telephone. They are all on the same page and agree that the best route for Ms.Heal is to focus on comfort acknowledging that she is at end of life. We discussed her lack of improvement and barriers to improvement. I am concerned that her intake will greatly impact her prognosis as she is not really eating/drinking but "maybe half a cup" her father says but then she vomits after she eats. We discussed in detail what comfort means and covered resuscitation, artifical feeding, future infection, etc. And they are all in agreement that her QOL suffers and that she herself has even wanted to die (they say she even has had suicide attempts) the past few years. They acknowledge the role her alcohol abuse has played in her prognosis and all agree she has had a tough life. We discussed a plan for return to [facility] with hospice for EOL care. All questions/concerns answered. Emotional support provided.    Contacts/Participants in Discussion: Primary Decision Maker: Husband Vanessa Hoffman   Goals of Care/Code Status/Advance Care Planning:   Code Status: DNR  Artificial feeding: no  Antibiotics: no  Rehospitalization: no   Symptom Management:   Bowel regimen: Lactulose scheduled if she will take.   Pain/Dyspnea: Roxanol 5 mg every 2 hours prn.   Vomiting: Reglan 5 mg solution prior to meals. Zofran prn (please order ODT upon discharge).   Anxiety: Lorazepam 0.5 mg  every 4 hours prn.   Psycho-social/Spiritual:   Support System: Family at bedside very supportive. Unfortunately they were in the process of moving here from [place] and when her husband returned to [place] (she fell and was in rehab here) he suffered a brain aneurysm and is stuck in [place]. All his support is here in [place].   Prognosis: Likely weeks, possibly a 1-2 months with increased toleration of intake  Discharge Planning:  [place] with Hospice       Chief Complaint/HPI: 59 yo female PMH anxiety, depression, fibromyalgia, hypertension, ETOH abuse (per family drank ~ a fifth of vodka daily x 10-11 yrs), recent skull fracture and subdural hematoma. Admitted with worsening mental status and lethargy. Unfortunately she has made little progress and is not even able to swallow safely (has NGT for feedings/meds). Prognosis is grim with continued encephalopathy.   Primary Diagnoses  Present on Admission:  . Altered mental status . Change in mental status . Hyperlipidemia . Vascular dementia . Hypertension . Post concussive syndrome . Anemia . AKI (acute kidney injury) . Coma . Encephalopathy, hepatic . Alcohol abuse . Elevated liver enzymes . Increased anion gap metabolic acidosis . Acute kidney injury . Hypokalemia . Hypomagnesemia . HLD (hyperlipidemia) . Encephalopathy acute . Liver cirrhosis . UTI (lower urinary tract infection)  Palliative Review of Systems:   Unable to assess - minimally responsive.    I have reviewed the medical record, interviewed the patient and family, and examined the patient. The following aspects are pertinent.  Past Medical History  Diagnosis Date  .  Anxiety   . Fibromyalgia   . Osteoporosis   . Depression   . Hyperlipidemia   . Hypertension   . Substance abuse     ETOH, reportedly in past, l  . Arthritis   . DDD (degenerative disc disease)   . Diabetes mellitus without complication    Social History    Social History  . Marital Status: Married    Spouse Name: N/A  . Number of Children: N/A  . Years of Education: N/A   Social History Main Topics  . Smoking status: Current Every Day Smoker -- 0.05 packs/day for 20 years    Types: Cigarettes  . Smokeless tobacco: Never Used  . Alcohol Use: No     Comment: Abuse 4 years ago;unclear if ongoing  . Drug Use: No     Comment: Smoked marijuana some until she was in her 14's  . Sexual Activity: No   Other Topics Concern  . None   Social History Narrative   Family History  Problem Relation Age of Onset  . Heart disease Father    Scheduled Meds: . antiseptic oral rinse  7 mL Mouth Rinse BID  . famotidine  20 mg Oral Daily  . feeding supplement (ENSURE ENLIVE)  237 mL Oral TID PC  . folic acid  1 mg Oral Daily  . lactulose  30 g Oral QID  . rifaximin  550 mg Per Tube BID  . saccharomyces boulardii  250 mg Oral BID  . thiamine  100 mg Oral Daily   Continuous Infusions: . dextrose 100 mL/hr at 04/28/15 2315   PRN Meds:.ondansetron (ZOFRAN) IV, RESOURCE THICKENUP CLEAR Medications Prior to Admission:  Prior to Admission medications   Medication Sig Start Date End Date Taking? Authorizing Provider  amitriptyline (ELAVIL) 50 MG tablet Take 50 mg by mouth 2 (two) times daily.   Yes Historical Provider, MD  donepezil (ARICEPT) 10 MG tablet Take 10 mg by mouth at bedtime.   Yes Historical Provider, MD  HYDROcodone-acetaminophen (NORCO/VICODIN) 5-325 MG per tablet Take 1 tablet by mouth every 12 (twelve) hours as needed for severe pain. 03/05/15  Yes Milagros Loll, MD  ibuprofen (ADVIL,MOTRIN) 600 MG tablet Take 1 tablet (600 mg total) by mouth every 6 (six) hours as needed for headache or moderate pain. 03/05/15  Yes Milagros Loll, MD  meclizine (ANTIVERT) 25 MG tablet Take 1 tablet (25 mg total) by mouth 2 (two) times daily as needed for dizziness. 03/05/15  Yes Milagros Loll, MD  Multiple Vitamins-Iron (MULTIVITAMINS WITH IRON)  TABS Take 1 tablet by mouth daily. Vitamin supplement. 08/21/11  Yes Greig Castilla, FNP  pravastatin (PRAVACHOL) 20 MG tablet Take 1 tablet (20 mg total) by mouth at bedtime. For cholesterol management. 08/21/11  Yes Greig Castilla, FNP  promethazine (PHENERGAN) 25 MG tablet Take 1 tablet (25 mg total) by mouth every 6 (six) hours as needed for nausea or vomiting. 03/05/15  Yes Milagros Loll, MD  sertraline (ZOLOFT) 100 MG tablet Take 200 mg by mouth daily.   Yes Historical Provider, MD  thiamine 100 MG tablet Take 1 tablet (100 mg total) by mouth daily. 03/05/15  Yes Milagros Loll, MD  folic acid (FOLVITE) 1 MG tablet Take 1 tablet (1 mg total) by mouth daily. 03/05/15   Milagros Loll, MD   No Known Allergies CBC:    Component Value Date/Time   WBC 15.8* 04/26/2015 0427   HGB 8.7* 04/26/2015 0427   HCT  25.7* 04/26/2015 0427   PLT 142* 04/26/2015 0427   MCV 92.1 04/26/2015 0427   NEUTROABS 11.2* 04/18/2015 0256   LYMPHSABS 1.8 04/18/2015 0256   MONOABS 1.5* 04/18/2015 0256   EOSABS 0.2 04/18/2015 0256   BASOSABS 0.1 04/18/2015 0256   Comprehensive Metabolic Panel:    Component Value Date/Time   NA 149* 04/29/2015 0528   K 3.8 04/29/2015 0528   CL 125* 04/29/2015 0528   CO2 18* 04/29/2015 0528   BUN 11 04/29/2015 0528   CREATININE 1.01* 04/29/2015 0528   GLUCOSE 152* 04/29/2015 0528   CALCIUM 8.2* 04/29/2015 0528   AST 117* 04/29/2015 0528   ALT 76* 04/29/2015 0528   ALKPHOS 141* 04/29/2015 0528   BILITOT 1.1 04/29/2015 0528   PROT 5.5* 04/29/2015 0528   ALBUMIN 1.7* 04/29/2015 0528    Physical Exam:  Vital Signs: BP 108/53 mmHg  Pulse 98  Temp(Src) 98.4 F (36.9 C) (Oral)  Resp 18  Ht _0  (1.626 m)  Wt 85.276 kg (188 lb)  BMI 32.25 kg/m2  SpO2 98% SpO2: SpO2: 98 % O2 Device: O2 Device: Not Delivered O2 Flow Rate:   Intake/output summary:  Intake/Output Summary (Last 24 hours) at 04/29/15 0902 Last data filed at 04/29/15 0827  Gross per 24 hour    Intake    120 ml  Output    500 ml  Net   -380 ml   LBM: Last BM Date: 04/26/15 Baseline Weight: Weight: 86.183 kg (190 lb) Most recent weight: Weight: 85.276 kg (188 lb)  Exam Findings:  General: NAD, lying in bed HEENT: [place]/AT, eyes jaundice Resp: No labored breathing Abd: Distended - ascites Extrem: Warm to touch Neuro: Arousable but only looks around and moans/grunts then fall back asleep, did not follow any commands           Palliative Performance Scale: 20 %                Additional Data Reviewed: Recent Labs     04/28/15  0620  04/29/15  0528  NA  150*  149*  BUN  11  11  CREATININE  1.02*  1.01*     Time In: 1050  Time Out: 1215 Time Total: 60mn  Greater than 50%  of this time was spent counseling and coordinating care related to the above assessment and plan.  Discussed plan of care with Dr. EHartford Poli   Signed by:  AVinie Sill NP Palliative Medicine Team Pager # [phone](M-F 8a-5p) Team Phone # [phone](Nights/Weekends)

## 2015-04-29 NOTE — Evaluation (Signed)
SLP Cancellation Note  Patient Details Name: Vanessa Hoffman MRN: [id number] DOB: 1955/12/28   Cancelled treatment:       Reason Eval/Treat Not Completed: Other (comment) (palliative referral pending) Donavan Burnet, MS [facility] SLP [phone]

## 2015-04-29 NOTE — Clinical Social Work Note (Signed)
CSW reviewed chart, once pt is medically stable she can return back to [facility]. CSW will continue to provided support to the family and assist with discharge needs.  Armando Lauman, MSW, LCSWA [phone]  

## 2015-04-29 NOTE — Progress Notes (Signed)
Patient is eating better this morning she ate 100 % of breakfast, and is able to answer some questions, will continue to monitor

## 2015-04-29 NOTE — Progress Notes (Signed)
Patient ID: Vanessa Hoffman, female   DOB: 07/11/1956, 59 y.o.   MRN: [id number] [facility] PROGRESS NOTE  Vanessa Hoffman WUJ:[id number] DOB: 02/22/1956 DOA: 04/15/2015 PCP: No primary care provider on file.   Subjective:   Continues to be disoriented, lethargic and has slurry speech. Palliative and hospice team to meet with family today.  Brief narrative:    59 year old female from nursing home with past medical hitory of anxiety, depression, fibromyalgia, hypertension. She presented from SNF where she was residing after sustaining skull fracture and subdural hematoma. She presented with worsening mental status changes, lethargy.   Barrier to discharge: Lethargy, dysphagia   Assessment/Plan:    Principal Problem: Acute hepatic encephalopathy - Per CT on this admission, little change in right parietal subdural hematoma. Pt also with history of alcohol abuse so her altered mental status could be from hepatic encephalopathy.  -Hypokalemia corrected, hypokalemia increases renal ammonium production. - Ammonia level reached a peak of 138, 48 this morning. -Continued lactulose and rifaximin, patient is not improving as expected.  Subdural hematoma, right -Hs subacute subdural hematoma measures 5 mm over the right cerebral hemisphere with mild right to left midline shift. -Patient seen by neurosurgery and likely to have surgery but not emergent.   Skull base fracture -Patient has nondisplaced fracture through posterior left ovarian extending to the skull base. -Per radiology possibly extending to the left occipital condyle.  Alcohol abuse - Per husband patient used to drink a fifth of vodka for 10-11 years - UDS unremarkable - Patient's mother has confirmed pt drug use history and alcohol abuse   Aspiration risk / Severe protein calorie malnutrition / Failure to thrive in adult  - High risk of aspiration due to altered mental status - Continue aspiration precautions -  Continue NG tube - SLP recommended dysphagia 1 with nectar thick liquids.  Chronic liver disease, clinically consistent with cirrhosis versus fatty liver  - Has transaminitis with AST/ALT ratio greater than 2/1 consistent with alcoholic consumption. - Patient has hypoalbuminemia, thrombocytopenia and hyperammonemia - Hepatitis panels negative, HIV is negative as well. - Abdominal ultrasound; moderate ascites, liver texture consistent with fatty liver.  Hypokalemia / Hypomagnesemia  - Likely due to loose stools from the lactulose. - Supplemented potassium and magnesium  - Follow up magnesium and potassium, had BMP in a.m.  Frequent falls - Secondary to intoxication, altered mental status - PT eval once she is able to participate   Hyperlipidemia - Resume pravastatin once LFTS normal  Anemia of chronic disease / Thrombocytopenia  - Unclear if it's secondary to alcohol versus chronic liver disease. - Hemoglobin stable  - Platelets 101 - Using SCD's for DVT prophylaxis   AKI (acute kidney injury) / Metabolic acidosis  - Likely prerenal, dehydration - Resolved with IV fluid hydration, presented with creatinine of 1.26, creatinine currently 0.8.  UTI Klebsiella pneumonia / Leukocytosis  - Treated with IV Rocephin for 7 days.  Hypernatremia -Sodium is 154, started on IV D5W infusion, check sodium level in the morning. -Sodium improved from 150 12/11/1948 since yesterday, continue IV fluids. -D5W his helping with the hypernatremia but increasing the whole body anasarca, can't use Lasix now because of recent hypernatremia.  Coagulopathy Likely secondary to chronic liver disease, on the 30th INR was 2.26, patient received multiple doses of vitamin K. INR is 1.56., Not sure if is going to improve further.  DVT Prophylaxis  - SCD's bilaterally    Code Status: Full.  Family Communication:  Family  not at the bedside at this time Disposition Plan: needs PT evaluation   IV access:   Peripheral IV  Procedures and diagnostic studies:    Ct Head Wo Contrast 04/20/2015  Little change in size of RIGHT parietal subdural hematoma.  No new intracranial hemorrhage or infarction identified.   Electronically Signed   By: Ulyses Southward M.D.   On: 04/20/2015 19:16   Dg Swallowing Func-speech Pathology 04/22/2015  Moderately severe oral and moderate pharyngeal dysphagia with sensorimotor  deficits and largely impacted by pt's mental status.   Medical Consultants:  Neurology  Other Consultants:  SLP Physical therapy  IAnti-Infectives:   Rocephin 04/15/2015 --> 04/22/2015 Rifaximin     Vanessa Hoffman A, MD  [facility] Pager [phone]  Time spent in minutes: 25 minutes  If 7PM-7AM, please contact night-coverage [email or web address] Password [email or web address] 04/29/2015, 11:46 AM   LOS: 14 days    Objective: Filed Vitals:   04/29/15 0055 04/29/15 0209 04/29/15 0511 04/29/15 1002  BP: 97/53  108/53 111/85  Pulse: 103  98 94  Temp: 98.6 F (37 C)  98.4 F (36.9 C) 98 F (36.7 C)  TempSrc: Oral  Oral Oral  Resp: Height:      Weight:  85.276 kg (188 lb)    SpO2: 97%  98% 98%    Intake/Output Summary (Last 24 hours) at 04/29/15 1146 Last data filed at 04/29/15 0827  Gross per 24 hour  Intake    120 ml  Output    500 ml  Net   -380 ml    Exam:   General:  Pt is alert, not in acute distress; NG tube in place   Cardiovascular: Regular rate and rhythm, S1/S2 appreciated   Respiratory: Clear to auscultation bilaterally, no wheezing, no crackles, no rhonchi  Abdomen: Soft, non tender, non distended, bowel sounds present  Extremities: No edema, pulses DP and PT palpable bilaterally  Neuro: Grossly nonfocal  Data Reviewed: Basic Metabolic Panel:  Recent Labs Lab 04/25/15 0638 04/26/15 0427 04/27/15 0410 04/28/15 0620 04/29/15 0528  NA 151* 153* 154* 150* 149*  K 3.5 3.8 4.3 4.3 3.8  CL 125* 128* >130* 128* 125*  CO2 17* 17* 18* 17* 18*  GLUCOSE  125* 124* 151* 152* 152*  BUN CREATININE 0.96 1.04* 1.02* 1.02* 1.01*  CALCIUM 8.5* 8.6* 8.5* 8.3* 8.2*  MG 2.2 2.0 2.0 1.8 1.7   Liver Function Tests:  Recent Labs Lab 04/25/15 4540 04/26/15 0427 04/27/15 0410 04/28/15 0620 04/29/15 0528  AST 294* 302* 245* 180* 117*  ALT 111* 116* 108* 92* 76*  ALKPHOS 174* 185* 172* 157* 141*  BILITOT 1.6* 1.4* 1.4* 1.2 1.1  PROT 5.9* 6.3* 6.3* 5.6* 5.5*  ALBUMIN 2.0* 2.1* 2.0* 1.8* 1.7*   No results for input(s): LIPASE, AMYLASE in the last 168 hours.  Recent Labs Lab 04/25/15 0638 04/26/15 0427 04/27/15 0410 04/28/15 0620 04/29/15 0528  AMMONIA 48* 57* 35 43* 38*   CBC:  Recent Labs Lab 04/23/15 0601 04/26/15 0427  WBC 16.4* 15.8*  HGB 8.3* 8.7*  HCT 24.4* 25.7*  MCV 93.8 92.1  PLT 132* 142*   Cardiac Enzymes: No results for input(s): CKTOTAL, CKMB, CKMBINDEX, TROPONINI in the last 168 hours. BNP: Invalid input(s): POCBNP CBG: No results for input(s): GLUCAP in the last 168 hours.  No results found for this or any previous visit (from the past 240 hour(s)).   Scheduled Meds: . antiseptic oral rinse  7 mL Mouth Rinse BID  . famotidine  20 mg Oral Daily  . feeding supplement (ENSURE ENLIVE)  237 mL Oral TID PC  . folic acid  1 mg Oral Daily  . lactulose  30 g Oral QID  . rifaximin  550 mg Per Tube BID  . saccharomyces boulardii  250 mg Oral BID  . thiamine  100 mg Oral Daily   Continuous Infusions: . dextrose 100 mL/hr at 04/28/15 2315

## 2015-04-29 NOTE — Progress Notes (Addendum)
Patient vomited around 1030 large amount, vomitus was the color of the lactulose she had just been given. Will continue to monitor. Rectal tube is not staying in patient does not have the needed sphincter muscle tension to maintain it.

## 2015-04-30 MED ORDER — RIFAXIMIN 550 MG PO TABS: 550.0000 mg | ORAL_TABLET | Freq: Two times a day (BID) | ORAL | Status: AC

## 2015-04-30 MED ORDER — IBUPROFEN 200 MG PO TABS
400.0000 mg | ORAL_TABLET | Freq: Four times a day (QID) | ORAL | Status: DC | PRN
  Administered 2015-04-30: 400 mg via ORAL
  Filled 2015-04-30: qty 2

## 2015-04-30 MED ORDER — MORPHINE SULFATE (CONCENTRATE) 10 MG/0.5ML PO SOLN: 5.0000 mg | ORAL | Status: AC | PRN

## 2015-04-30 NOTE — Clinical Social Work Note (Signed)
CSW left a voice mail for a hospice referral to Vanessa Hoffman from [facility]. Vanessa Hoffman confirmed receipt of the referral. CSW awaiting a call back from Vanessa Hoffman.   Vanessa Hoffman, MSW, LCSWA [phone]

## 2015-04-30 NOTE — Progress Notes (Signed)
SLP Cancellation Note  Patient Details Name: Vanessa Hoffman MRN: [id number] DOB: 05-22-1956   Cancelled treatment:       Reason Eval/Treat Not Completed: Other (comment) (slp sign off due to pt now being made comfort care)   Donavan Burnet, MS [facility] SLP [phone]

## 2015-04-30 NOTE — Consult Note (Addendum)
[facility] Liaison: Received request from CSW for family interest in [facility]. Chart reviewed. Received report from Elmarie Shiley indicating changes overnight. Spoke with patient's spouse, father and mother. All agreeable to transfer to [facility]. Confirmed with family that patient does have Medicaid but they still want patient to transfer to [facility] for end of life care. Family shares they have been told patient has two to three weeks at the most. Family requesting Dr. Kern Reap to assume care due to patient does not have a local PCP. Patient's spouse is agreeable for patient's father to complete paper work for [facility] [facility] transfer. Plan is for [facility] liaison to meet with family tomorrow at 10:00 to complete paper work. Sent message to CSW before and after contacting family. Made Dr. Arthor Captain aware. Will follow up with CSW in am.   Thank you. Forrestine Him, LCSW [phone]

## 2015-04-30 NOTE — Progress Notes (Addendum)
Patient ID: Vanessa Hoffman, female   DOB: [birth year], 59 y.o.   MRN: [id number] [facility] PROGRESS NOTE  Vanessa Hoffman:[id number] DOB: 08-31-1955 DOA: 04/15/2015 PCP: No primary care provider on file.   Subjective:   Continues to be disoriented, lethargic and has slurry speech. Palliative and hospice team met with the family on 04/29/15 and family desire hospice. Await residential hospice versus SNF with palliative care. Fever of 102.4 earlier today, she is going for hospice, discussed with the [place] liaison  Brief narrative:    59 year old female from nursing home with past medical hitory of anxiety, depression, fibromyalgia, hypertension. She presented from SNF where she was residing after sustaining skull fracture and subdural hematoma. She presented with worsening mental status changes, lethargy.   Barrier to discharge: Accepted for [facility] residential hospice at [facility], anticipated discharge date is 05/01/2015  Assessment/Plan:    Principal Problem: Acute hepatic encephalopathy - Per CT on this admission, little change in right parietal subdural hematoma. Pt also with history of alcohol abuse so her altered mental status could be from hepatic encephalopathy.  -Hypokalemia corrected, hypokalemia increases renal ammonium production. 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  Subdural hematoma, right -Hs subacute subdural hematoma measures 5 mm over the right cerebral hemisphere with mild right to left midline shift. -Patient seen by neurosurgery and likely to have surgery but not emergent.   Skull base fracture -Patient has nondisplaced fracture through posterior left ovarian extending to the skull base. -Per radiology possibly extending to the left occipital condyle.  Alcohol abuse - Per husband  patient used to drink Hoffman fifth of vodka for 10-11 years - UDS unremarkable - Patient's mother has confirmed pt drug use history and alcohol abuse   Aspiration risk / Severe protein calorie malnutrition / Failure to thrive in adult  - High risk of aspiration due to altered mental status - Continue aspiration precautions - Continue NG tube - SLP recommended dysphagia 1 with nectar thick liquids.  Chronic liver disease, clinically consistent with cirrhosis versus fatty liver  - Has transaminitis with AST/ALT ratio greater than 2/1 consistent with alcoholic consumption. - Patient has hypoalbuminemia, thrombocytopenia and hyperammonemia - Hepatitis panels negative, HIV is negative as well. - Abdominal ultrasound; moderate ascites, liver texture consistent with fatty liver.  Hypokalemia / Hypomagnesemia  - Likely due to loose stools from the lactulose, keep potassium up as it complicates hepatic encephalopathy. - Supplemented potassium and magnesium  - Follow up magnesium and potassium, had BMP in Hoffman.m.  Frequent falls - Secondary to intoxication, altered mental status - PT eval once she is able to participate   Hyperlipidemia - Resume pravastatin once LFTS normal  Anemia of chronic disease / Thrombocytopenia  - Unclear if it's secondary to alcohol versus chronic liver disease. - Hemoglobin stable  - Platelets 101 - Using SCD's for DVT prophylaxis   AKI (acute kidney injury) / Metabolic acidosis  - Likely prerenal, dehydration - Resolved with IV fluid hydration, presented with creatinine of 1.26, creatinine currently 1.  UTI Klebsiella pneumonia / Leukocytosis  - Treated with IV Rocephin for 7 days.  Hypernatremia -Sodium is 154, started on IV D5W infusion, check sodium level in the morning. -Sodium improved from 150 12/11/1948 since yesterday, continue IV fluids. -D5W his helping with the hypernatremia but increasing the whole body anasarca, can't use  Lasix now because of recent  hypernatremia.  Coagulopathy Likely secondary to chronic liver disease, on the 30th INR was 2.26, patient received multiple doses of vitamin K. INR is 1.56., Not sure if is going to improve further.  DVT Prophylaxis  - SCD's bilaterally    Code Status: Full.  Family Communication:  Family not at the bedside at this time Disposition Plan: needs PT evaluation   IV access:  Peripheral IV  Procedures and diagnostic studies:    Ct Head Wo Contrast 04/20/2015  Little change in size of RIGHT parietal subdural hematoma.  No new intracranial hemorrhage or infarction identified.   Electronically Signed   By: Lavonia Dana M.D.   On: 04/20/2015 19:16   Dg Swallowing Func-speech Pathology 04/22/2015  Moderately severe oral and moderate pharyngeal dysphagia with sensorimotor  deficits and largely impacted by pt's mental status.   Medical Consultants:  Neurology  Other Consultants:  SLP Physical therapy  IAnti-Infectives:   Rocephin 04/15/2015 --> 04/22/2015 Rifaximin     Vanessa Worthington A, MD  [facility] Pager [phone]  Time spent in minutes: 25 minutes  If 7PM-7AM, please contact night-coverage [email or web address] Password [email or web address] 04/30/2015, 4:46 PM   LOS: 15 days    Objective: Filed Vitals:   04/30/15 0059 04/30/15 0530 04/30/15 0955 04/30/15 1424  BP: 104/64 118/78 88/48 110/52  Pulse: 94 92 77 88  Temp: 97.9 F (36.6 C) 97.6 F (36.4 C) 102.4 F (39.1 C) 98.9 F (37.2 C)  TempSrc: Oral Oral Axillary Oral  Resp: 18 20 20 20   Height:      Weight:      SpO2: 97% 98% 97% 98%    Intake/Output Summary (Last 24 hours) at 04/30/15 1646 Last data filed at 04/30/15 0800  Gross per 24 hour  Intake    240 ml  Output    300 ml  Net    -60 ml    Exam:   General:  Pt is alert, not in acute distress; NG tube in place   Cardiovascular: Regular rate and rhythm, S1/S2 appreciated   Respiratory: Clear to auscultation bilaterally, no wheezing, no crackles, no  rhonchi  Abdomen: Soft, non tender, non distended, bowel sounds present  Extremities: No edema, pulses DP and PT palpable bilaterally  Neuro: Grossly nonfocal  Data Reviewed: Basic Metabolic Panel:  Recent Labs Lab 04/25/15 0638 04/26/15 0427 04/27/15 0410 04/28/15 0620 04/29/15 0528  NA 151* 153* 154* 150* 149*  K 3.5 3.8 4.3 4.3 3.8  CL 125* 128* >130* 128* 125*  CO2 17* 17* 18* 17* 18*  GLUCOSE 125* 124* 151* 152* 152*  BUN 14 16 13 11 11   CREATININE 0.96 1.04* 1.02* 1.02* 1.01*  CALCIUM 8.5* 8.6* 8.5* 8.3* 8.2*  MG 2.2 2.0 2.0 1.8 1.7   Liver Function Tests:  Recent Labs Lab 04/25/15 0037 04/26/15 0427 04/27/15 0410 04/28/15 0620 04/29/15 0528  AST 294* 302* 245* 180* 117*  ALT 111* 116* 108* 92* 76*  ALKPHOS 174* 185* 172* 157* 141*  BILITOT 1.6* 1.4* 1.4* 1.2 1.1  PROT 5.9* 6.3* 6.3* 5.6* 5.5*  ALBUMIN 2.0* 2.1* 2.0* 1.8* 1.7*   No results for input(s): LIPASE, AMYLASE in the last 168 hours.  Recent Labs Lab 04/25/15 0638 04/26/15 0427 04/27/15 0410 04/28/15 0620 04/29/15 0528  AMMONIA 48* 57* 35 43* 38*   CBC:  Recent Labs Lab 04/26/15 0427  WBC 15.8*  HGB 8.7*  HCT 25.7*  MCV 92.1  PLT 142*   Cardiac Enzymes: No results  for input(s): CKTOTAL, CKMB, CKMBINDEX, TROPONINI in the last 168 hours. BNP: Invalid input(s): POCBNP CBG: No results for input(s): GLUCAP in the last 168 hours.  No results found for this or any previous visit (from the past 240 hour(s)).   Scheduled Meds: . antiseptic oral rinse  7 mL Mouth Rinse BID  . famotidine  20 mg Oral Daily  . folic acid  1 mg Oral Daily  . lactulose  30 g Oral QID  . metoCLOPramide  5 mg Oral TID AC  . rifaximin  550 mg Per Tube BID  . saccharomyces boulardii  250 mg Oral BID   Continuous Infusions:

## 2015-04-30 NOTE — Progress Notes (Signed)
Daily Progress Note   Patient Name: Vanessa Hoffman       Date: 04/30/2015 DOB: April 15, 1956  Age: 59 y.o. MRN#: [id number] Attending Physician: Clydia Llano, MD Primary Care Physician: No primary care provider on file. Admit Date: 04/15/2015  Reason for Consultation/Follow-up: Establishing goals of care  Subjective:     Vanessa Hoffman opens her eyes and looks at me briefly, smiles, and then goes back to sleep. She has not eaten/drank really much of anything since lunch yesterday (breaksfast and lunch tray remain untouched - she is not alert enough to feed). She is also running high fever today. These changes definitely change prognosis to most likely days to a couple weeks and at this point I do not believe this to improve but to continue to decline. Family goal is for her to be comfortable. She has had agitation per family but today looks fairly comfortable although much changed from yesterday. Spoke with family (stepmother Vanessa Hoffman who will communicate with the rest of the family) and they agree to look for hospice facility at this point (this was their first option yesterday but was not clear to me her prognosis was so limited). I will continue to follow and support.    Length of Stay: 15 days  Current Medications: Scheduled Meds:  . antiseptic oral rinse  7 mL Mouth Rinse BID  . famotidine  20 mg Oral Daily  . feeding supplement (ENSURE ENLIVE)  237 mL Oral TID PC  . folic acid  1 mg Oral Daily  . lactulose  30 g Oral QID  . metoCLOPramide  5 mg Oral TID AC  . rifaximin  550 mg Per Tube BID  . saccharomyces boulardii  250 mg Oral BID  . thiamine  100 mg Oral Daily    Continuous Infusions: . dextrose 500 mL (04/29/15 1455)    PRN Meds: ibuprofen, LORazepam, morphine CONCENTRATE, ondansetron (ZOFRAN) IV, RESOURCE THICKENUP CLEAR  Palliative Performance Scale: 10-20%     Vital Signs: BP 88/48 mmHg  Pulse 77  Temp(Src) 102.4 F (39.1 C) (Axillary)  Resp 20  Ht  (1.626  m)  Wt 85.276 kg (188 lb)  BMI 32.25 kg/m2  SpO2 97% SpO2: SpO2: 97 % O2 Device: O2 Device: Not Delivered O2 Flow Rate:    Intake/output summary:  Intake/Output Summary (Last 24 hours) at 04/30/15 1037 Last data filed at 04/30/15 0800  Gross per 24 hour  Intake    360 ml  Output    300 ml  Net     60 ml   LBM: Last BM Date: 04/26/15 Baseline Weight: Weight: 86.183 kg (190 lb) Most recent weight: Weight: 85.276 kg (188 lb)  Physical Exam: General: NAD, lying in bed Resp: No labored breathing Abd: Distended - ascites Neuro: Much less responsive today, less interactive, sleeping   Additional Data Reviewed: Recent Labs     04/28/15  0620  04/29/15  0528  NA  150*  149*  BUN  11  11  CREATININE  1.02*  1.01*     Problem List:  Patient Active Problem List   Diagnosis Date Noted  . Palliative care encounter   . Encephalopathy acute   . Liver cirrhosis   . UTI (lower urinary tract infection)   . Hypokalemia   . Hypomagnesemia   . HLD (hyperlipidemia)   . Coma   . Hypernatremia   . Encephalopathy, hepatic   . Alcohol abuse   . Elevated liver enzymes   . Increased  anion gap metabolic acidosis   . Acute kidney injury   . Altered mental status 04/15/2015  . Change in mental status 04/15/2015  . Anemia 04/15/2015  . AKI (acute kidney injury) 04/15/2015  . Abnormal liver enzymes 04/08/2015  . SDH (subdural hematoma) 04/04/2015  . Generalized weakness 03/30/2015  . Dysphagia, pharyngoesophageal phase 03/26/2015  . Alcohol abuse, in remission 03/06/2015  . Post concussive syndrome 03/06/2015  . Depression   . Hyperlipidemia   . Hypertension   . Arthritis   . Skull fracture, non depressed 03/02/2015  . Frequent falls 03/02/2015  . Vascular dementia 08/03/2011  . 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 Symptom  Management:  Bowel regimen: Lactulose scheduled if she will take (doubt she will be able to continue po - may d/c if this is the case - not helping much at this point).  Pain/Dyspnea: Roxanol 5 mg every 2 hours prn.   Vomiting: Reglan 5 mg solution prior to meals. Zofran prn.   Anxiety: Lorazepam 0.5 mg every 4 hours prn.   5. Prognosis: < 2 weeks  5. Discharge Planning: Hospice facility   Care plan was discussed with family, CSW, RN.   Thank you for allowing the Palliative Medicine Team to assist in the care of this patient.   Time In/Out: 1030-1100 1515-1530 Total Time Prolonged Time Billed  no     Greater than 50%  of this time was spent counseling and coordinating care related to the above assessment and plan.     Yong Channel, NP Palliative Medicine Team Pager # [phone] (M-F 8a-5p) Team Phone # [phone] (Nights/Weekends)  04/30/2015, 10:37 AM

## 2015-04-30 NOTE — Care Management Note (Signed)
Case Management Note  Patient Details  Name: Vanessa Hoffman MRN: [id number] Date of Birth: 18-Sep-1955  Subjective/Objective:                    Action/Plan: Patient was admitted from [facility]. Plan is to discharge back when medically stable. CSW is following.  Expected Discharge Date:                  Expected Discharge Plan:  Skilled Nursing Facility  In-House Referral:  Clinical Social Work  Discharge planning Services     Post Acute Care Choice:    Choice offered to:     DME Arranged:    DME Agency:     HH Arranged:    HH Agency:     Status of Service:  In process, will continue to follow  Medicare Important Message Given:  Yes-third notification given Date Medicare IM Given:    Medicare IM give by:    Date Additional Medicare IM Given:    Additional Medicare Important Message give by:     If discussed at Long Length of Stay Meetings, dates discussed:    Additional Comments:  Anda Kraft, RN 04/30/2015, 11:31 AM

## 2015-04-30 NOTE — Discharge Summary (Signed)
Physician Discharge Summary  Vanessa Hoffman DZH:[id number] DOB: 02-20-1956 DOA: 04/15/2015  PCP: No primary care provider on file.  Admit date: 04/15/2015 Discharge date: 9/72016  Time spent: *40* minutes  Recommendations for Outpatient Follow-up:  1. Follow-up with residential hospice M.D.  Discharge Diagnoses:  Principal Problem:   Change in mental status Active Problems:   Vascular dementia   Hyperlipidemia   Hypertension   Post concussive syndrome   Altered mental status   Anemia   AKI (acute kidney injury)   Coma   Hypernatremia   Encephalopathy, hepatic   Alcohol abuse   Elevated liver enzymes   Increased anion gap metabolic acidosis   Acute kidney injury   Hypokalemia   Hypomagnesemia   HLD (hyperlipidemia)   Encephalopathy acute   Liver cirrhosis   UTI (lower urinary tract infection)   Palliative care encounter   Discharge Condition: Fair  Diet recommendation: Dysphagia 1 with nectar thick liquids  Filed Weights   04/27/15 0351 04/28/15 0426 04/29/15 0209  Weight: 86.818 kg (191 lb 6.4 oz) 87.232 kg (192 lb 5 oz) 85.276 kg (188 lb)    History of present illness:  Vanessa Hoffman is a 59 y.o. female with bellow PMH who is transferred from a local nursing home, where she was discharged to following a skull fracture and subdural hematoma early last month. There is not much information available, but apparently the patient is typically able to speak and have a conversation. Today in the afternoon patient was found altered and not responding. To the nursing home knowledge, there hasn't been any history of trauma, fever, nausea, emesis, diarrhea or any other symptoms. Her parents state that her appetite and oral intake have been decreased for the past few days.  She is currently in no acute distress, but continues to have altered mental status.    Hospital Course:   End-of-life discussion/hospice and palliative evaluation Before discharge patient  developed fever of 102.5, unclear source of infection, this could be secondary to aspiration. Patient is still lethargic, easy to arouse but still unable to communicate with the slurred speech and inappropriate answers. Family met with the palliative medicine team and decided to go for full comfort. Patient deemed appropriate for residential hospice, be discharged to [facility]  Acute hepatic encephalopathy - Per CT on this admission, little change in right parietal subdural hematoma. Pt also with history of alcohol abuse so her altered mental status could be from hepatic encephalopathy.  -Hypokalemia corrected, hypokalemia increases renal ammonium production. - Ammonia level reached a peak of 138 -Continued lactulose and rifaximin, as palliation for her symptoms -Patient might have more than acute hepatic encephalopathy, she might have alcohol dementia.  Subdural hematoma, right -Hs subacute subdural hematoma measures 5 mm over the right cerebral hemisphere with mild right to left midline shift. -Patient seen by neurosurgery and likely to have surgery but not emergent.   Skull base fracture -Patient has nondisplaced fracture through posterior left ovarian extending to the skull base. -Per radiology possibly extending to the left occipital condyle.  Alcohol abuse - Per husband patient used to drink a fifth of vodka for 10-11 years - UDS unremarkable - Patient's mother has confirmed pt drug use history and alcohol abuse   Aspiration risk / Severe protein calorie malnutrition / Failure to thrive in adult  - High risk of aspiration due to altered mental status - Continue aspiration precautions - Continue NG tube - SLP recommended dysphagia 1 with nectar thick liquids.  Chronic  liver disease, clinically consistent with cirrhosis versus fatty liver  - Has transaminitis with AST/ALT ratio greater than 2/1 consistent with alcoholic consumption. - Patient has hypoalbuminemia,  thrombocytopenia and hyperammonemia - Hepatitis panels negative, HIV is negative as well. - Abdominal ultrasound; moderate ascites, liver texture consistent with fatty liver.  Hypokalemia / Hypomagnesemia  - Likely due to loose stools from the lactulose, keep potassium up as it complicates hepatic encephalopathy. - Supplemented potassium and magnesium  - Follow up magnesium and potassium, had BMP in a.m.  Frequent falls - Secondary to intoxication, altered mental status - PT eval once she is able to participate   Hyperlipidemia - Resume pravastatin once LFTS normal  Anemia of chronic disease / Thrombocytopenia  - Unclear if it's secondary to alcohol versus chronic liver disease. - Hemoglobin stable  - Platelets 101 - Using SCD's for DVT prophylaxis   AKI (acute kidney injury) / Metabolic acidosis  - Likely prerenal, dehydration - Resolved with IV fluid hydration, presented with creatinine of 1.26, creatinine currently 1.  UTI Klebsiella pneumonia / Leukocytosis  - Treated with IV Rocephin for 7 days.  Hypernatremia -Sodium is 154, started on IV D5W infusion, check sodium level in the morning. -This is improved with D5W infusion.  Coagulopathy Likely secondary to chronic liver disease, on the 30th INR was 2.26, patient received multiple doses of vitamin K. INR is 1.56., Not sure if is going to improve further.  Procedures:  None  Consultations:  Neurosurgery.  Hospice and palliative medicine  Discharge Exam: Filed Vitals:   04/30/15 1424  BP: 110/52  Pulse: 88  Temp: 98.9 F (37.2 C)  Resp: 20   General: Lethargic, disoriented, slurred speech HEENT: anicteric sclera, pupils reactive to light and accommodation, EOMI CVS: S1-S2 clear, no murmur rubs or gallops Chest: clear to auscultation bilaterally, no wheezing, rales or rhonchi Abdomen: soft nontender, nondistended, normal bowel sounds, no organomegaly Extremities: no cyanosis, clubbing, +1 pedal edema  bilaterally. Neuro: Lethargic, easy to arouse but slurred speech.  Discharge Instructions    Current Discharge Medication List    START taking these medications   Details  Morphine Sulfate (MORPHINE CONCENTRATE) 10 MG/0.5ML SOLN concentrated solution Take 0.25 mLs (5 mg total) by mouth every 2 (two) hours as needed for severe pain or shortness of breath (dyspnea). Qty: 30 mL, Refills: 0    rifaximin (XIFAXAN) 550 MG TABS tablet Place 1 tablet (550 mg total) into feeding tube 2 (two) times daily. Qty: 60 tablet, Refills: 0      CONTINUE these medications which have NOT CHANGED   Details  amitriptyline (ELAVIL) 50 MG tablet Take 50 mg by mouth 2 (two) times daily.    donepezil (ARICEPT) 10 MG tablet Take 10 mg by mouth at bedtime.    Multiple Vitamins-Iron (MULTIVITAMINS WITH IRON) TABS Take 1 tablet by mouth daily. Vitamin supplement. Qty: 30 tablet, Refills: 0    thiamine 100 MG tablet Take 1 tablet (100 mg total) by mouth daily.    folic acid (FOLVITE) 1 MG tablet Take 1 tablet (1 mg total) by mouth daily.      STOP taking these medications     HYDROcodone-acetaminophen (NORCO/VICODIN) 5-325 MG per tablet      ibuprofen (ADVIL,MOTRIN) 600 MG tablet      meclizine (ANTIVERT) 25 MG tablet      pravastatin (PRAVACHOL) 20 MG tablet      promethazine (PHENERGAN) 25 MG tablet      sertraline (ZOLOFT) 100 MG tablet  No Known Allergies    The results of significant diagnostics from this hospitalization (including imaging, microbiology, ancillary and laboratory) are listed below for reference.    Significant Diagnostic Studies: Ct Head Wo Contrast  04/26/2015   CLINICAL DATA:  59 year old female with lethargy. Subdural hematoma following head trauma in July. Subsequent encounter.  EXAM: CT HEAD WITHOUT CONTRAST  TECHNIQUE: Contiguous axial images were obtained from the base of the skull through the vertex without intravenous contrast.  COMPARISON:  04/20/2015 and  earlier.  FINDINGS: Mild motion artifact. Right side nasoenteric tube removed. Visualized paranasal sinuses and mastoids are clear. Nondisplaced left occipital bone fracture remains visible. No new osseous abnormality. Negative orbit and scalp soft tissues.  Calcified atherosclerosis at the skull base. Mixed density left subdural hematoma persists, primarily along the superior convexity canal. This appears mildly decreased in volume, measuring approximately 8 mm in thickness (mildly decreased from prior at a comparable level). Stable mass effect on the right hemisphere with partial effacement of the right lateral ventricle. Trace leftward midline shift is stable. No ventriculomegaly. Basilar cisterns remain patent. No new intracranial hemorrhage. Stable gray-white matter differentiation. No acute cortically based infarct.  IMPRESSION: 1. Mild regression of mixed density right subdural hematoma since August. Stable mild intracranial mass effect. 2. No new intracranial abnormality identified. 3. Nondisplaced left occipital bone fracture remains visible.   Electronically Signed   By: Genevie Ann M.D.   On: 04/26/2015 11:33   Ct Head Wo Contrast  04/20/2015   CLINICAL DATA:  Subdural hematoma, history hypertension, diabetes mellitus  EXAM: CT HEAD WITHOUT CONTRAST  TECHNIQUE: Contiguous axial images were obtained from the base of the skull through the vertex without intravenous contrast.  COMPARISON:  04/15/2015  FINDINGS: Motion artifacts on initial imaging, for which repeat imaging was performed.  Generalized atrophy.  Stable ventricular morphology.  Little change in size of previously identified LEFT parietal subdural hematoma, 14 mm thick.  Attenuation of the subdural collection is unchanged as well.  Mild compression of the RIGHT hemisphere with minimal RIGHT to LEFT midline shift 3 mm.  No intraparenchymal hemorrhage, mass lesion or evidence of acute infarction.  Mild small vessel chronic ischemic changes of deep  cerebral white matter.  Bones and sinuses unremarkable.  IMPRESSION: Little change in size of RIGHT parietal subdural hematoma.  No new intracranial hemorrhage or infarction identified.   Electronically Signed   By: Lavonia Dana M.D.   On: 04/20/2015 19:16   Ct Head Wo Contrast  04/15/2015   CLINICAL DATA:  Altered mental status. Frequent falls. Skull fracture and chronic subdural hematoma.  EXAM: CT HEAD WITHOUT CONTRAST  TECHNIQUE: Contiguous axial images were obtained from the base of the skull through the vertex without intravenous contrast.  COMPARISON:  03/28/2015 and 03/02/2015  FINDINGS: Sinuses/Soft tissues: Re- demonstration of a nondisplaced left occipital bone fracture, including on image 14. No new fracture. Clear paranasal sinuses and mastoid air cells.  Intracranial: Mild to moderate low density in the periventricular white matter likely related to small vessel disease. Age advanced cerebral atrophy. mixed attenuation subdural collection adjacent the right cerebral hemisphere is minimally decreased. Measures maximally 11 mm on image 22 today versus 13 mm at the same level on the prior. 2-3 mm of right-to-left midline shift are similar. No sulci effacement. No hydrocephalus, new fluid collection, mass lesion.  IMPRESSION: 1. Minimal decrease in size of a right sided mixed acuity subdural hematoma. Similar minimal right-to-left midline shift. 2. No new superimposed process or explanation  for mental status changes. 3.  Cerebral atrophy and small vessel ischemic change. 4. Similar left occipital nondisplaced skull fracture.   Electronically Signed   By: Abigail Miyamoto M.D.   On: 04/15/2015 18:44   Mr Brain Wo Contrast  04/16/2015   CLINICAL DATA:  Altered mental status. History of subdural hematoma.  EXAM: MRI HEAD WITHOUT CONTRAST  TECHNIQUE: Multiplanar, multiecho pulse sequences of the brain and surrounding structures were obtained without intravenous contrast.  COMPARISON:  CT head 04/15/2015   FINDINGS: Right hemispheric subdural hematoma is unchanged from the CT. This measures up to 13 mm in thickness and has signal characteristics of subacute and chronic blood. Mild midline shift to the left measures 2.3 mm and is unchanged. No new area of hemorrhage since the prior CT.  Negative for acute infarct. Mild chronic microvascular ischemic change in the white matter.  Negative for hydrocephalus. Negative for mass lesion. No edema in the brain.  Paranasal sinuses clear.  Pituitary normal in size.  IMPRESSION: 13 mm subacute and chronic right-sided subdural hematoma. Mild midline shift of 2.3 mm to the left is unchanged. No new hemorrhage since the prior CT  Negative for acute infarct.   Electronically Signed   By: Franchot Gallo M.D.   On: 04/16/2015 16:23   US Abdomen Complete  04/16/2015   CLINICAL DATA:  Elevated liver enzymes, alcoholic cirrhosis, diabetes.  EXAM: ULTRASOUND ABDOMEN COMPLETE  COMPARISON:  None in PACs  FINDINGS: Gallbladder: The gallbladder is adequately distended. There is echogenic mobile material within the gallbladder consistent with echogenic bile and sludge. No mobile stones are observed. There is no gallbladder wall thickening, pericholecystic fluid, or definite sonographic Murphy sign. The patient is nonverbal however.  Common bile duct: Diameter: 4.2 mm where visualized.  Liver: The liver is subjectively enlarged. The echotexture is increased. There are hepatic cysts. In the right lobe the largest measures 1.9 x 1.7 x 2.7 cm. A smaller right lobe cyst measures 1.3 cm greatest dimension.  IVC: No abnormality visualized.  Pancreas: Evaluation of the pancreas is limited due to bowel gas. The observed portions of the pancreatic body are normal.  Spleen: Size and appearance within normal limits.  Right Kidney: Length: 9.1 cm. The renal cortical echotexture is similar to that of the adjacent liver. There is no hydronephrosis. There is a midpole cortical cyst measuring 1.3 cm in  greatest dimension.  Left Kidney: Length: 9.0 cm. Evaluation of the renal parenchyma on the left is limited due to bowel gas.  Abdominal aorta: The aorta exhibits normal caliber the bifurcation is obscured by bowel gas.  Other findings: There is a moderate volume of ascites present.  IMPRESSION: 1. The study is limited due to the patient's clinical condition, body habitus, and bowel gas. 2. Increased hepatic echotexture is consistent with fatty infiltration. There are cysts in the right lobe as described. 3. Sludge versus tiny non shadowing stones in the gallbladder. No sonographic evidence of acute cholecystitis. 4. Nonvisualization of the pancreas and aortic bifurcation. 5. Moderate ascites.   Electronically Signed   By: David  Martinique M.D.   On: 04/16/2015 14:16   Ct Abdomen Pelvis W Contrast  04/26/2015   CLINICAL DATA:  Abdominal discomfort, nausea, vomiting  EXAM: CT ABDOMEN AND PELVIS WITH CONTRAST  TECHNIQUE: Multidetector CT imaging of the abdomen and pelvis was performed using the standard protocol following bolus administration of intravenous contrast.  CONTRAST:  119m OMNIPAQUE IOHEXOL 300 MG/ML  SOLN  COMPARISON:  None.  FINDINGS: Lower  chest: Patchy right middle lobe and right lower lobe airspace disease likely reflecting atelectasis.  Hepatobiliary: 2.7 cm hypodense, fluid attenuating right hepatic mass likely representing a cyst. There is a smaller 12 mm hypodense, fluid attenuating right hepatic mass more inferiorly. Normal gallbladder. No intrahepatic or extrahepatic biliary ductal dilatation.  Pancreas: Normal.  Spleen: Normal.  Adrenals/Urinary Tract: Normal adrenal glands. Normal left kidney. 10 mm hypodense right renal mass likely representing a cyst. No obstructive uropathy. Decompressed bladder.  Stomach/Bowel: No bowel dilatation or bowel wall thickening. No pneumoperitoneum, pneumatosis or portal venous gas.  Vascular/Lymphatic: Normal caliber abdominal aorta with atherosclerosis.  Multiple colonic air-fluid levels and small bowel air-fluid levels as can be seen with enterocolitis. No abdominal or pelvic lymphadenopathy.  Reproductive: Normal uterus.  No adnexal mass.  Other: Large amount of abdominal and pelvic free fluid. Anasarca up.  Musculoskeletal: No acute osseous abnormality. No lytic or sclerotic osseous lesion. There is degenerative disc disease with disc height loss at L1-2.  IMPRESSION: 1. Moderate ascites. 2. Multiple colonic air-fluid levels and small bowel air-fluid levels as can be seen with enterocolitis.   Electronically Signed   By: Kathreen Devoid   On: 04/26/2015 22:03   Dg Chest Port 1 View  04/25/2015   CLINICAL DATA:  Acute onset of vomiting.  Initial encounter.  EXAM: PORTABLE CHEST - 1 VIEW  COMPARISON:  Chest radiograph performed 04/15/2015  FINDINGS: The lungs are well-aerated. Mild vascular congestion is noted. Mild right-sided opacity may reflect atelectasis or possibly mild pneumonia. There is no evidence of pleural effusion or pneumothorax.  The cardiomediastinal silhouette is mildly enlarged. No acute osseous abnormalities are seen. Cervical spinal fusion hardware is noted.  IMPRESSION: Mild vascular congestion and mild cardiomegaly. Mild right-sided opacity may reflect atelectasis or possibly mild pneumonia.   Electronically Signed   By: Garald Balding M.D.   On: 04/25/2015 01:28   Dg Chest Portable 1 View  04/15/2015   CLINICAL DATA:  Altered mental status for 1 day. History of hypertension, diabetes, smoker.  EXAM: PORTABLE CHEST - 1 VIEW  COMPARISON:  04/04/2010  FINDINGS: Mild cardiac enlargement. Pulmonary vascularity is normal. No focal airspace disease or consolidation in the lungs. No blunting of costophrenic angles. No pneumothorax. Postoperative changes in the cervical spine.  IMPRESSION: Mild cardiac enlargement.  No evidence of active pulmonary disease.   Electronically Signed   By: Lucienne Capers M.D.   On: 04/15/2015 18:31   Dg Abd Portable  1v  04/16/2015   CLINICAL DATA:  NG tube placement  EXAM: PORTABLE ABDOMEN - 1 VIEW  COMPARISON:  04/16/2015  FINDINGS: Enteric tube tip is been advanced. Tip and side hole are now projected over the left upper quadrant consistent with location in the upper stomach. Nonobstructive bowel gas pattern. Multiple calcifications in the pelvis, likely phleboliths.  IMPRESSION: Enteric tube tip advanced to the left upper quadrant consistent with location in the upper stomach.   Electronically Signed   By: Lucienne Capers M.D.   On: 04/16/2015 19:18   Dg Abd Portable 1v  04/16/2015   CLINICAL DATA:  Status post NG tube placement.  EXAM: PORTABLE ABDOMEN - 1 VIEW  COMPARISON:  Lumbar spine radiograph 03/02/2015  FINDINGS: NG tube tip projects over the stomach however the side port is within the distal esophagus, recommend advancement. Relative paucity of small bowel gas. Left lung base is clear.  IMPRESSION: NG tube side-port projects at the GE junction, recommend advancement.  These results will be called to  the ordering clinician or representative by the Radiologist Assistant, and communication documented in the PACS or zVision Dashboard.   Electronically Signed   By: Lovey Newcomer M.D.   On: 04/16/2015 18:03   Dg Abd Portable 2v  04/25/2015   CLINICAL DATA:  Vomiting  EXAM: PORTABLE ABDOMEN - 2 VIEW  COMPARISON:  None.  FINDINGS: Limited portable radiography demonstrates an unremarkable bowel gas pattern. There is no evidence of free air. There is no evidence of obstruction.  IMPRESSION: Negative.   Electronically Signed   By: Andreas Newport M.D.   On: 04/25/2015 01:27   Dg Swallowing Func-speech Pathology  04/22/2015    Objective Swallowing Evaluation:    Patient Details  Name: BRYANNA YIM MRN: [id number] Date of Birth: 1955-12-31  Today's Date: 04/22/2015 Time: SLP Start Time (ACUTE ONLY): 0944-SLP Stop Time (ACUTE ONLY): 1007 SLP Time Calculation (min) (ACUTE ONLY): 23 min  Past Medical History:  Past  Medical History  Diagnosis Date  . Anxiety   . Fibromyalgia   . Osteoporosis   . Depression   . Hyperlipidemia   . Hypertension   . Substance abuse     ETOH, reportedly in past, l  . Arthritis   . DDD (degenerative disc disease)   . Diabetes mellitus without complication    Past Surgical History:  Past Surgical History  Procedure Laterality Date  . Cervical disc surgery  2001    3 fusions  . Cesarean section      2   HPI:  Other Pertinent Information: Pt is a 59 y.o.F SNF resident w/ a Hx  Anxiety, Fibromyalgia, Depression, Alcohol Abuse, vascular dementia,  tobacco abuse, HTN, HLD, DM2, and a recent skkull fx w/ subdural hematoma  who presented to the ED w/ altered mental status. The patient is  typically able to speak and have a conversation. The patient was  reportedly found in an altered state, not responding. To the nursing home  knowledge, there hadn't been any history of trauma, fever, nausea, emesis,  diarrhea or any other symptoms. Her family stated her appetite and oral  intake had been decreased for a few days. Pt has been recieving temporary  nutrition via NG tube, previously not alert for PO.    No Data Recorded  Assessment / Plan / Recommendation CHL IP CLINICAL IMPRESSIONS 04/22/2015  Therapy Diagnosis Moderate pharyngeal phase dysphagia;Severe oral phase  dysphagia;Moderate oral phase dysphagia  Clinical Impression   Moderately severe oral and moderate pharyngeal dysphagia with sensorimotor  deficits and largely impacted by pt's mental status.  Pt was fully alert  but did not follow commands to dry swallow or cough consistently despite  max verbal and visual cues.  Oral transiting delays were severe - with  boluses essentially spilling into pharynx with delayed swallow reponse to  pyriform with thin liquids  Swallow was marginally weak with resultant  oropharyngeal residuals that mixed with secretions.  Anticipate improved  secretion management as pt able to consume po.   Pt did tracely and  silently  aspirate thin via tsp due to delayed swallow and barium spilling  into open larynx.  No aspiration of nectar noted however laryngeal  penetration of nectar mixed with secretions noted again due to delay  swallow.     Pt remains at risk of aspiration due to mentation - options include to  continue npo except single ice chips and necessary medications crushed  with pudding with strict precautions and follow clinically for readiness  for po.  Or replacement of alternative means of nutrition with small bore  feeding tube and single ice chips only - again following clinically for po  readiness.  Using live video, educated pt to findings and recommendations.     Will follow for skilled SLP dysphagia treatment.        CHL IP TREATMENT RECOMMENDATION 04/22/2015  Treatment Recommendations Therapy as outlined in treatment plan below     CHL IP DIET RECOMMENDATION 04/22/2015  SLP Diet Recommendations NPO except meds;Ice chips PRN after oral care  Liquid Administration via (None)  Medication Administration Crushed with puree  Compensations Slow rate;Small sips/bites  Postural Changes and/or Swallow Maneuvers (None)     CHL IP OTHER RECOMMENDATIONS 04/22/2015  Recommended Consults (None)  Oral Care Recommendations Oral care QID  Other Recommendations Have oral suction available     No flowsheet data found.   CHL IP FREQUENCY AND DURATION 04/22/2015  Speech Therapy Frequency (ACUTE ONLY) min 2x/week  Treatment Duration 2 weeks     Pertinent Vitals/Pain     SLP Swallow Goals No flowsheet data found.  No flowsheet data found.    CHL IP REASON FOR REFERRAL 04/22/2015  Reason for Referral Objectively evaluate swallowing function     CHL IP ORAL PHASE 04/22/2015  Lips (None)  Tongue (None)  Mucous membranes (None)  Nutritional status (None)  Other (None)  Oxygen therapy (None)  Oral Phase Impaired  Oral - Pudding Teaspoon (None)  Oral - Pudding Cup (None)  Oral - Honey Teaspoon (None)  Oral - Honey Cup (None)  Oral - Honey Syringe (None)   Oral - Nectar Teaspoon (None)  Oral - Nectar Cup (None)  Oral - Nectar Straw (None)  Oral - Nectar Syringe (None)  Oral - Ice Chips (None)  Oral - Thin Teaspoon (None)  Oral - Thin Cup (None)  Oral - Thin Straw (None)  Oral - Thin Syringe (None)  Oral - Puree (None)  Oral - Mechanical Soft (None)  Oral - Regular (None)  Oral - Multi-consistency (None)  Oral - Pill (None)  Oral Phase - Comment (None)      CHL IP PHARYNGEAL PHASE 04/22/2015  Pharyngeal Phase Impaired  Pharyngeal - Pudding Teaspoon (None)  Penetration/Aspiration details (pudding teaspoon) (None)  Pharyngeal - Pudding Cup (None)  Penetration/Aspiration details (pudding cup) (None)  Pharyngeal - Honey Teaspoon (None)  Penetration/Aspiration details (honey teaspoon) (None)  Pharyngeal - Honey Cup (None)  Penetration/Aspiration details (honey cup) (None)  Pharyngeal - Honey Syringe (None)  Penetration/Aspiration details (honey syringe) (None)  Pharyngeal - Nectar Teaspoon (None)  Penetration/Aspiration details (nectar teaspoon) (None)  Pharyngeal - Nectar Cup (None)  Penetration/Aspiration details (nectar cup) (None)  Pharyngeal - Nectar Straw (None)  Penetration/Aspiration details (nectar straw) (None)  Pharyngeal - Nectar Syringe (None)  Penetration/Aspiration details (nectar syringe) (None)  Pharyngeal - Ice Chips (None)  Penetration/Aspiration details (ice chips) (None)  Pharyngeal - Thin Teaspoon (None)  Penetration/Aspiration details (thin teaspoon) (None)  Pharyngeal - Thin Cup (None)  Penetration/Aspiration details (thin cup) (None)  Pharyngeal - Thin Straw (None)  Penetration/Aspiration details (thin straw) (None)  Pharyngeal - Thin Syringe (None)  Penetration/Aspiration details (thin syringe') (None)  Pharyngeal - Puree (None)  Penetration/Aspiration details (puree) (None)  Pharyngeal - Mechanical Soft (None)  Penetration/Aspiration details (mechanical soft) (None)  Pharyngeal - Regular (None)  Penetration/Aspiration details (regular) (None)   Pharyngeal - Multi-consistency (None)  Penetration/Aspiration details (multi-consistency) (None)  Pharyngeal - Pill (None)  Penetration/Aspiration details (pill) (None)  Pharyngeal Comment (None)  [facility] IP CERVICAL ESOPHAGEAL PHASE 04/22/2015  Cervical Esophageal Phase Impaired  Pudding Teaspoon (None)  Pudding Cup (None)  Honey Teaspoon (None)  Honey Cup (None)  Honey Straw (None)  Nectar Teaspoon Prominent cricopharyngeal segment;Reduced cricopharyngeal  relaxation;Esophageal backflow into the pharynx  Nectar Cup Reduced cricopharyngeal relaxation;Esophageal backflow into the  pharynx  Nectar Straw (None)  Nectar Sippy Cup (None)  Thin Teaspoon Prominent cricopharyngeal segment;Reduced cricopharyngeal  relaxation  Thin Cup (None)  Thin Straw (None)  Thin Sippy Cup (None)  Cervical Esophageal Comment appearance of poor esophageal clearance  throughout - radiologist not present to confirm    No flowsheet data found.         Luanna Salk, [facility] SLP [phone]     Microbiology: No results found for this or any previous visit (from the past 240 hour(s)).   Labs: Basic Metabolic Panel:  Recent Labs Lab 04/25/15 0638 04/26/15 0427 04/27/15 0410 04/28/15 0620 04/29/15 0528  NA 151* 153* 154* 150* 149*  K 3.5 3.8 4.3 4.3 3.8  CL 125* 128* >130* 128* 125*  CO2 17* 17* 18* 17* 18*  GLUCOSE 125* 124* 151* 152* 152*  BUN _0 CREATININE 0.96 1.04* 1.02* 1.02* 1.01*  CALCIUM 8.5* 8.6* 8.5* 8.3* 8.2*  MG 2.2 2.0 2.0 1.8 1.7   Liver Function Tests:  Recent Labs Lab 04/25/15 7615 04/26/15 0427 04/27/15 0410 04/28/15 0620 04/29/15 0528  AST 294* 302* 245* 180* 117*  ALT 111* 116* 108* 92* 76*  ALKPHOS 174* 185* 172* 157* 141*  BILITOT 1.6* 1.4* 1.4* 1.2 1.1  PROT 5.9* 6.3* 6.3* 5.6* 5.5*  ALBUMIN 2.0* 2.1* 2.0* 1.8* 1.7*   No results for input(s): LIPASE, AMYLASE in the last 168 hours.  Recent Labs Lab 04/25/15 0638 04/26/15 0427 04/27/15 0410 04/28/15 0620  04/29/15 0528  AMMONIA 48* 57* 35 43* 38*   CBC:  Recent Labs Lab 04/26/15 0427  WBC 15.8*  HGB 8.7*  HCT 25.7*  MCV 92.1  PLT 142*   Cardiac Enzymes: No results for input(s): CKTOTAL, CKMB, CKMBINDEX, TROPONINI in the last 168 hours. BNP: BNP (last 3 results) No results for input(s): BNP in the last 8760 hours.  ProBNP (last 3 results) No results for input(s): PROBNP in the last 8760 hours.  CBG: No results for input(s): GLUCAP in the last 168 hours.     Signed:  Ellenore Roscoe A  [facility] 04/30/2015, 5:01 PM

## 2015-05-01 NOTE — Progress Notes (Signed)
Patient discharged to hospice via transportation. Report called in to facility per protocol. Family at bedside informed of patient discharged status.

## 2015-05-25 DEATH — deceased

## 2017-01-06 IMAGING — CT CT HEAD WO/W CM
1 of 2 series · 13 of 30 positions shown, 17 images · IV contrast (omnipaque)
Comparison: CT head 03/02/2015

CLINICAL DATA: History of fall. Bilateral numbness and weakness.
Decreased mental status.

EXAM:
CT HEAD WITHOUT AND WITH CONTRAST
TECHNIQUE: Contiguous axial images were obtained from the base of the skull
through the vertex without and with intravenous contrast
CONTRAST:  100mL OMNIPAQUE IOHEXOL 300 MG/ML  SOLN

[Series 2: head 5.0 h30s · axial · 0.44mm/px · z∈[-94,+36]mm · 13 of 32 slices shown, 17 images]
[im 3/32  brain]
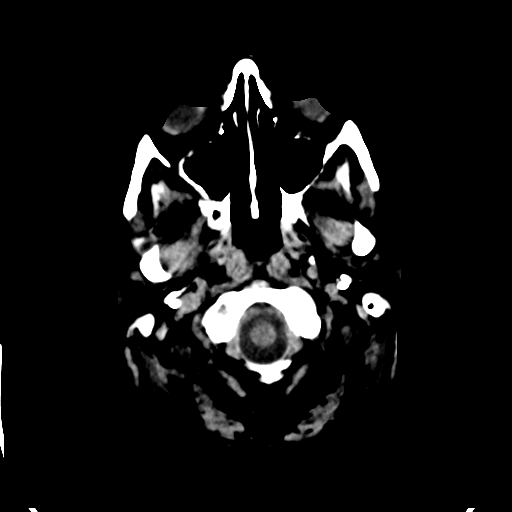
[im 3/32  bone]
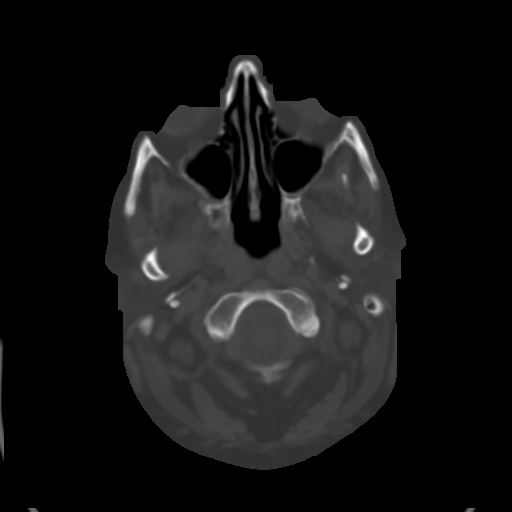
[im 5/32  brain]
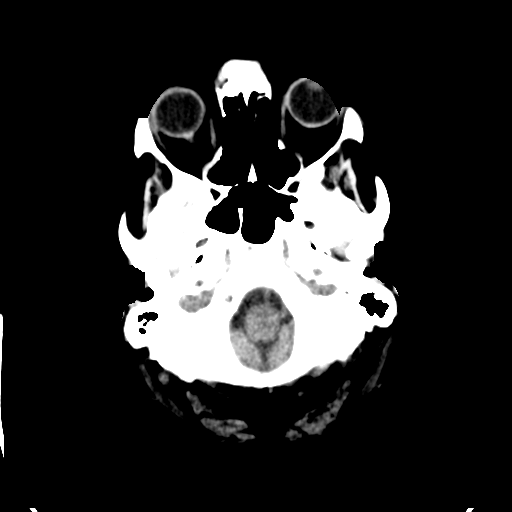
[im 7/32  brain]
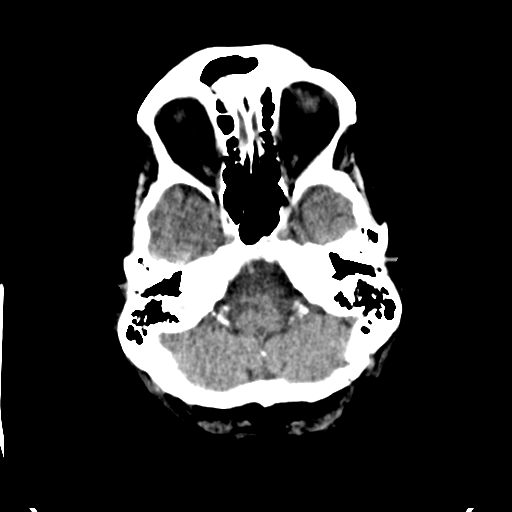
[im 9/32  brain]
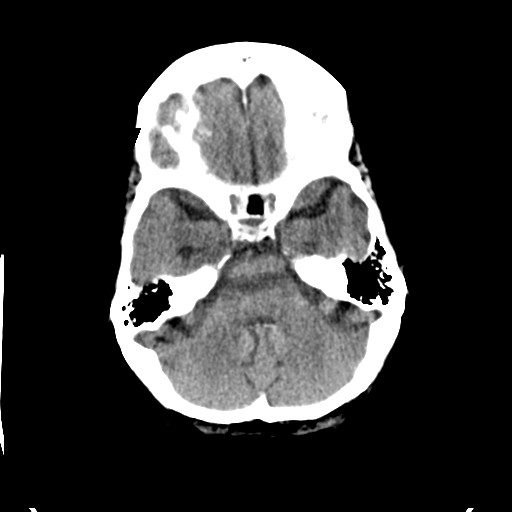
[im 12/32  brain]
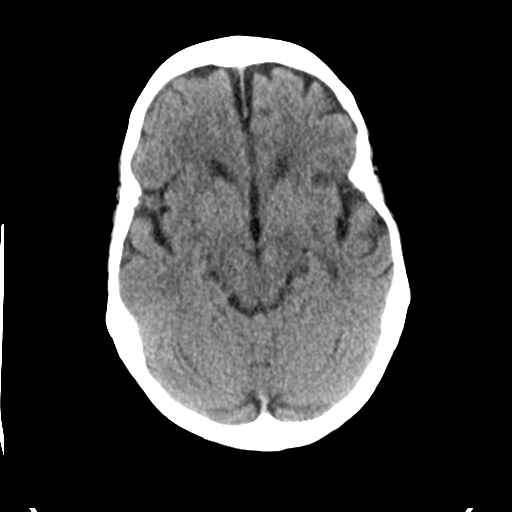
[im 12/32  bone]
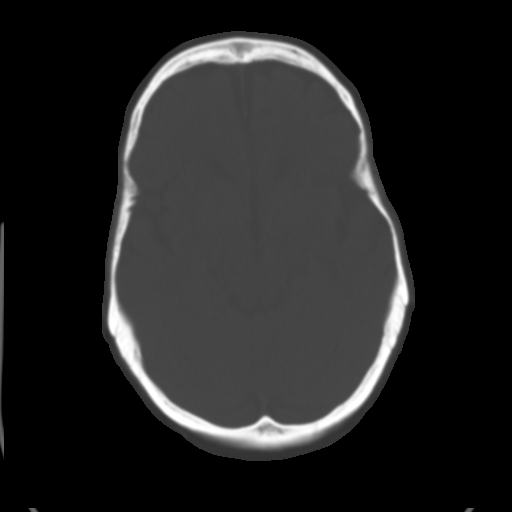
[im 14/32  brain]
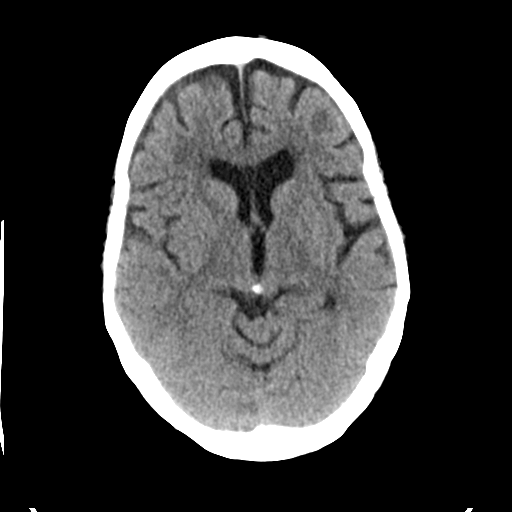
[im 16/32  brain]
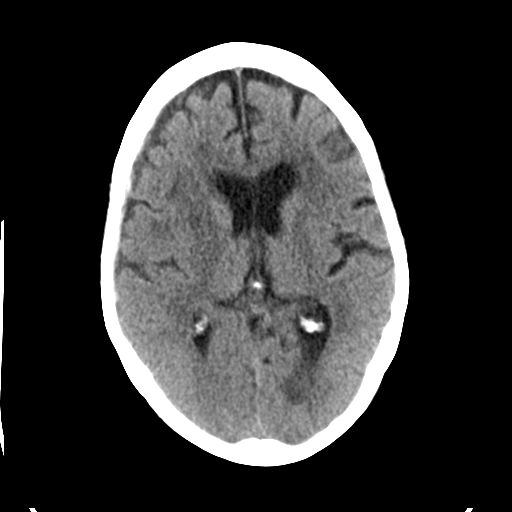
[im 18/32  brain]
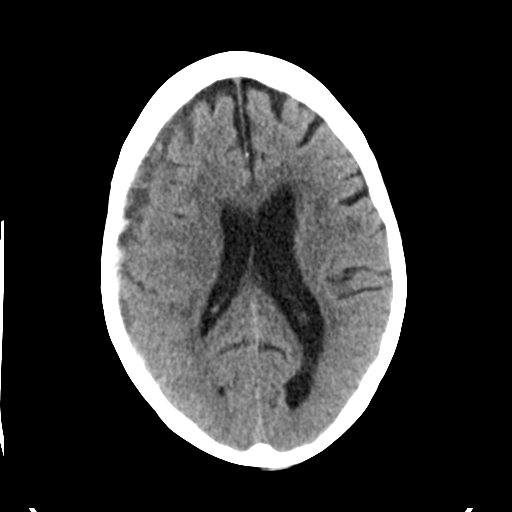
[im 20/32  brain]
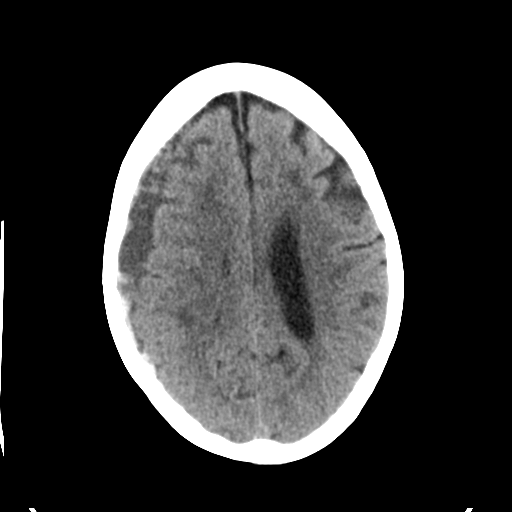
[im 20/32  bone]
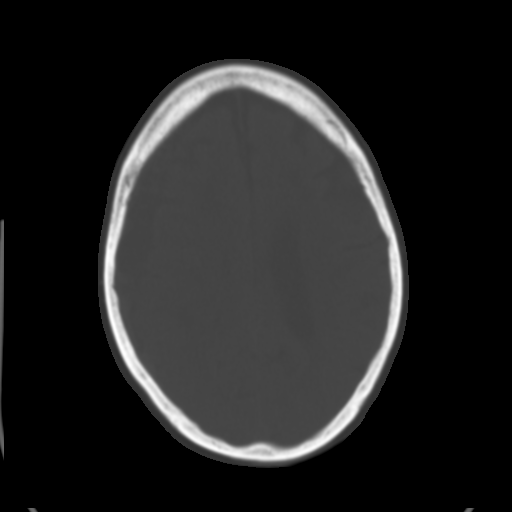
[im 23/32  brain]
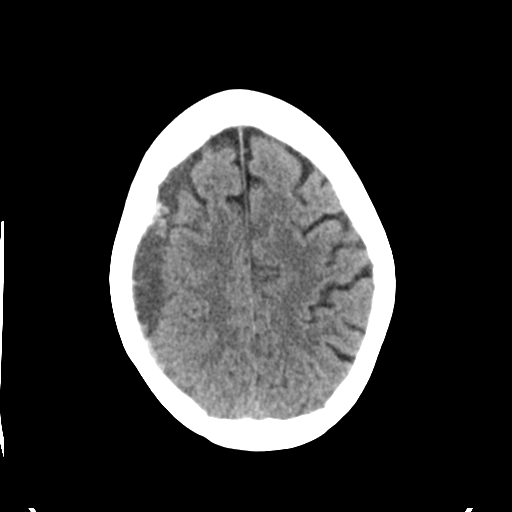
[im 25/32  brain]
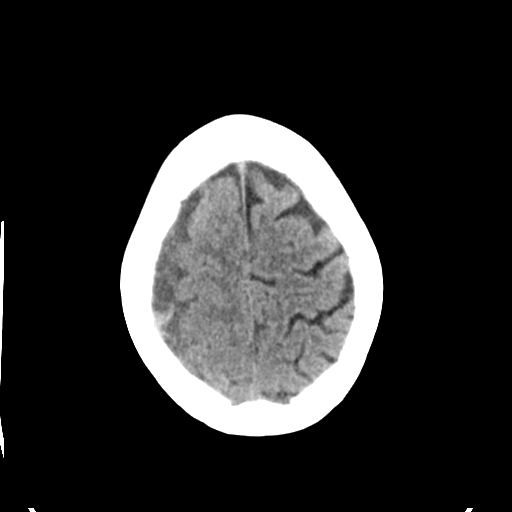
[im 27/32  brain]
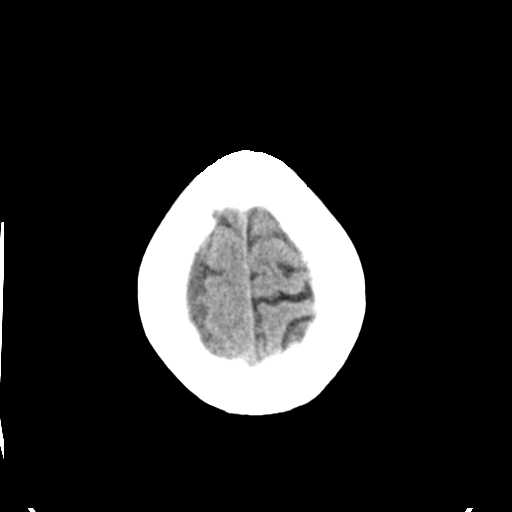
[im 29/32  brain]
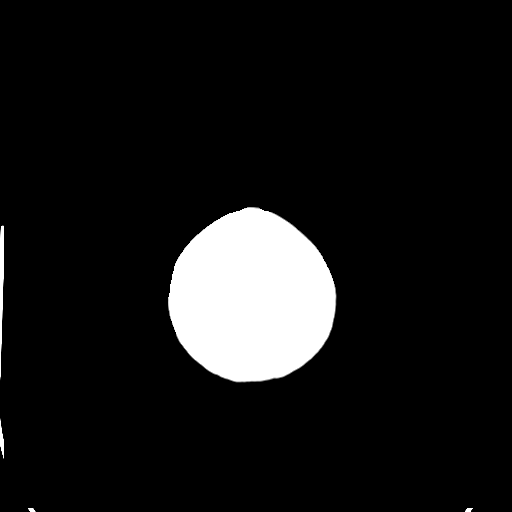
[im 29/32  bone]
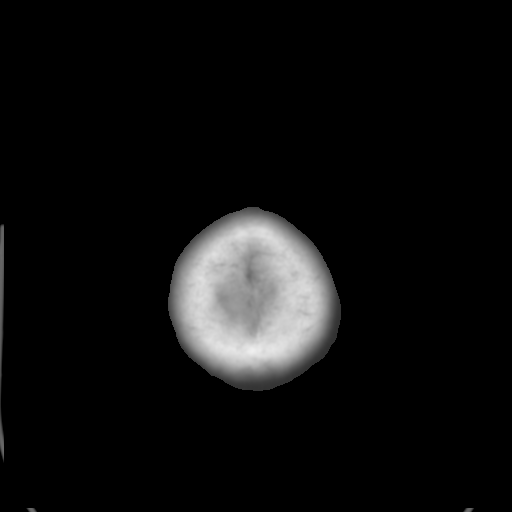

[13 of 30 positions shown; findings below may reference images not displayed]

FINDINGS: Interval enlargement of subdural hematoma on the right now measuring
13 mm in thickness. This has predominately low-density but also
areas of higher density blood. Mild mass-effect on the right
hemisphere and mild midline shift to the left 3 mm, slightly
increased from the prior study. No subarachnoid hemorrhage.

Ventricle size normal. Mild chronic microvascular ischemic change in
the white matter. No acute infarct or mass.

Left occipital skull fracture is nondisplaced and unchanged from the
prior study. No new fracture.

Paranasal sinuses clear
IMPRESSION: Enlargement of right-sided subdural hematoma now measuring 13 mm.
Subdural hematoma has mixed high and low density suggesting chronic
and subacute hemorrhage. Mild midline shift to the left measuring 3
mm has increased slightly from the prior study.

Nondisplaced left occipital fracture unchanged from the prior study.

Critical Value/emergent results were called by telephone at the time
of interpretation on 03/28/2015 at [DATE] to Milance Finck NP , who
verbally acknowledged these results.
